# Patient Record
Sex: Female | Born: 1940 | Race: White | Hispanic: No | Smoking: Current every day smoker
Health system: Southern US, Community
[De-identification: ages and names within clinical notes are randomized; demographics above are authoritative.]

## PROBLEM LIST (undated history)

## (undated) DIAGNOSIS — D509 Iron deficiency anemia, unspecified: Secondary | ICD-10-CM

## (undated) DIAGNOSIS — T4145XA Adverse effect of unspecified anesthetic, initial encounter: Secondary | ICD-10-CM

## (undated) DIAGNOSIS — C801 Malignant (primary) neoplasm, unspecified: Secondary | ICD-10-CM

## (undated) DIAGNOSIS — T8859XA Other complications of anesthesia, initial encounter: Secondary | ICD-10-CM

## (undated) DIAGNOSIS — R112 Nausea with vomiting, unspecified: Secondary | ICD-10-CM

## (undated) DIAGNOSIS — K219 Gastro-esophageal reflux disease without esophagitis: Secondary | ICD-10-CM

## (undated) DIAGNOSIS — Z9889 Other specified postprocedural states: Secondary | ICD-10-CM

## (undated) HISTORY — PX: WRIST FOREIGN BODY REMOVAL: SUR1120

## (undated) HISTORY — PX: APPENDECTOMY: SHX54

## (undated) HISTORY — PX: ABDOMINAL HYSTERECTOMY: SHX81

## (undated) HISTORY — PX: COLONOSCOPY: SHX174

## (undated) HISTORY — PX: PERCUTANEOUS PINNING WRIST FRACTURE: SHX2211

## (undated) HISTORY — DX: Gastro-esophageal reflux disease without esophagitis: K21.9

---
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
## 2012-03-27 DIAGNOSIS — B009 Herpesviral infection, unspecified: Secondary | ICD-10-CM | POA: Insufficient documentation

## 2015-04-23 ENCOUNTER — Emergency Department: Payer: No Typology Code available for payment source

## 2015-04-23 ENCOUNTER — Encounter: Payer: Self-pay | Admitting: Emergency Medicine

## 2015-04-23 ENCOUNTER — Emergency Department
Admission: EM | Admit: 2015-04-23 | Discharge: 2015-04-23 | Disposition: A | Discharge status: Home / Self Care | Payer: No Typology Code available for payment source | Attending: Emergency Medicine | Admitting: Emergency Medicine

## 2015-04-23 DIAGNOSIS — Z880 Allergy status to penicillin: Secondary | ICD-10-CM | POA: Insufficient documentation

## 2015-04-23 DIAGNOSIS — S92325A Nondisplaced fracture of second metatarsal bone, left foot, initial encounter for closed fracture: Secondary | ICD-10-CM | POA: Diagnosis not present

## 2015-04-23 DIAGNOSIS — Y9389 Activity, other specified: Secondary | ICD-10-CM | POA: Diagnosis not present

## 2015-04-23 DIAGNOSIS — S61411A Laceration without foreign body of right hand, initial encounter: Secondary | ICD-10-CM | POA: Diagnosis not present

## 2015-04-23 DIAGNOSIS — S301XXA Contusion of abdominal wall, initial encounter: Secondary | ICD-10-CM | POA: Insufficient documentation

## 2015-04-23 DIAGNOSIS — Y92410 Unspecified street and highway as the place of occurrence of the external cause: Secondary | ICD-10-CM | POA: Insufficient documentation

## 2015-04-23 DIAGNOSIS — S92332A Displaced fracture of third metatarsal bone, left foot, initial encounter for closed fracture: Secondary | ICD-10-CM | POA: Insufficient documentation

## 2015-04-23 DIAGNOSIS — S92902A Unspecified fracture of left foot, initial encounter for closed fracture: Secondary | ICD-10-CM

## 2015-04-23 DIAGNOSIS — S62101A Fracture of unspecified carpal bone, right wrist, initial encounter for closed fracture: Secondary | ICD-10-CM

## 2015-04-23 DIAGNOSIS — F17200 Nicotine dependence, unspecified, uncomplicated: Secondary | ICD-10-CM | POA: Insufficient documentation

## 2015-04-23 DIAGNOSIS — S9032XA Contusion of left foot, initial encounter: Secondary | ICD-10-CM | POA: Insufficient documentation

## 2015-04-23 DIAGNOSIS — S6991XA Unspecified injury of right wrist, hand and finger(s), initial encounter: Secondary | ICD-10-CM | POA: Diagnosis present

## 2015-04-23 DIAGNOSIS — S52614A Nondisplaced fracture of right ulna styloid process, initial encounter for closed fracture: Secondary | ICD-10-CM | POA: Diagnosis not present

## 2015-04-23 DIAGNOSIS — Y998 Other external cause status: Secondary | ICD-10-CM | POA: Diagnosis not present

## 2015-04-23 DIAGNOSIS — S29001A Unspecified injury of muscle and tendon of front wall of thorax, initial encounter: Secondary | ICD-10-CM | POA: Diagnosis not present

## 2015-04-23 DIAGNOSIS — T07XXXA Unspecified multiple injuries, initial encounter: Secondary | ICD-10-CM

## 2015-04-23 LAB — COMPREHENSIVE METABOLIC PANEL
ALK PHOS: 83 U/L (ref 38–126)
ALT: 34 U/L (ref 14–54)
AST: 50 U/L — ABNORMAL HIGH (ref 15–41)
Albumin: 3.9 g/dL (ref 3.5–5.0)
Anion gap: 11 (ref 5–15)
BUN: 11 mg/dL (ref 6–20)
CALCIUM: 9.5 mg/dL (ref 8.9–10.3)
CO2: 23 mmol/L (ref 22–32)
CREATININE: 0.67 mg/dL (ref 0.44–1.00)
Chloride: 106 mmol/L (ref 101–111)
Glucose, Bld: 119 mg/dL — ABNORMAL HIGH (ref 65–99)
Potassium: 3.9 mmol/L (ref 3.5–5.1)
SODIUM: 140 mmol/L (ref 135–145)
Total Bilirubin: 0.5 mg/dL (ref 0.3–1.2)
Total Protein: 7.7 g/dL (ref 6.5–8.1)

## 2015-04-23 LAB — URINALYSIS COMPLETE WITH MICROSCOPIC (ARMC ONLY)
BACTERIA UA: NONE SEEN
Bilirubin Urine: NEGATIVE
GLUCOSE, UA: NEGATIVE mg/dL
Ketones, ur: NEGATIVE mg/dL
LEUKOCYTES UA: NEGATIVE
NITRITE: NEGATIVE
PH: 7 (ref 5.0–8.0)
PROTEIN: NEGATIVE mg/dL
SPECIFIC GRAVITY, URINE: 1.005 (ref 1.005–1.030)

## 2015-04-23 LAB — CBC WITH DIFFERENTIAL/PLATELET
BASOS ABS: 0.1 10*3/uL (ref 0–0.1)
Basophils Relative: 1 %
EOS ABS: 0.1 10*3/uL (ref 0–0.7)
Eosinophils Relative: 1 %
HEMATOCRIT: 38.7 % (ref 35.0–47.0)
HEMOGLOBIN: 12.7 g/dL (ref 12.0–16.0)
Lymphocytes Relative: 14 %
Lymphs Abs: 1.7 10*3/uL (ref 1.0–3.6)
MCH: 30.2 pg (ref 26.0–34.0)
MCHC: 32.9 g/dL (ref 32.0–36.0)
MCV: 91.9 fL (ref 80.0–100.0)
MONOS PCT: 7 %
Monocytes Absolute: 0.8 10*3/uL (ref 0.2–0.9)
NEUTROS PCT: 77 %
Neutro Abs: 9.2 10*3/uL — ABNORMAL HIGH (ref 1.4–6.5)
PLATELETS: 318 10*3/uL (ref 150–440)
RBC: 4.21 MIL/uL (ref 3.80–5.20)
RDW: 12.9 % (ref 11.5–14.5)
WBC: 11.9 10*3/uL — ABNORMAL HIGH (ref 3.6–11.0)

## 2015-04-23 LAB — TROPONIN I: Troponin I: <0.03 ng/mL (ref <0.031)

## 2015-04-23 MED ORDER — ADENOSINE 12 MG/4ML IV SOLN
INTRAVENOUS | Status: AC
  Filled 2015-04-23: qty 4

## 2015-04-23 MED ORDER — ACETAMINOPHEN 10 MG/ML IV SOLN
1000.0000 mg | Freq: Four times a day (QID) | INTRAVENOUS | Status: DC
  Administered 2015-04-23: 1000 mg via INTRAVENOUS
  Filled 2015-04-23 (×4): qty 100

## 2015-04-23 MED ORDER — SODIUM CHLORIDE 0.9 % IV BOLUS (SEPSIS)
1000.0000 mL | Freq: Once | INTRAVENOUS | Status: AC
  Administered 2015-04-23: 1000 mL via INTRAVENOUS

## 2015-04-23 MED ORDER — ADENOSINE 12 MG/4ML IV SOLN
INTRAVENOUS | Status: AC
  Filled 2015-04-23: qty 8

## 2015-04-23 MED ORDER — IOHEXOL 350 MG/ML SOLN
100.0000 mL | Freq: Once | INTRAVENOUS | Status: AC | PRN
  Administered 2015-04-23: 100 mL via INTRAVENOUS
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
## 2015-04-23 NOTE — ED Notes (Signed)
Pt discharged to home.  Family member driving.  Discharge instructions reviewed.  Verbalized understanding.  No questions or concerns at this time.  Teach back verified.  Pt in NAD.  No items left in ED.   
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
## 2015-04-23 NOTE — Discharge Instructions (Signed)
Cast or Splint Care Casts and splints support injured limbs and keep bones from moving while they heal. It is important to care for your cast or splint at home.  HOME CARE INSTRUCTIONS  Keep the cast or splint uncovered during the drying period. It can take 24 to 48 hours to dry if it is made of plaster. A fiberglass cast will dry in less than 1 hour.  Do not rest the cast on anything harder than a pillow for the first 24 hours.  Do not put weight on your injured limb or apply pressure to the cast until your health care provider gives you permission.  Keep the cast or splint dry. Wet casts or splints can lose their shape and may not support the limb as well. A wet cast that has lost its shape can also create harmful pressure on your skin when it dries. Also, wet skin can become infected.  Cover the cast or splint with a plastic bag when bathing or when out in the rain or snow. If the cast is on the trunk of the body, take sponge baths until the cast is removed.  If your cast does become wet, dry it with a towel or a blow dryer on the cool setting only.  Keep your cast or splint clean. Soiled casts may be wiped with a moistened cloth.  Do not place any hard or soft foreign objects under your cast or splint, such as cotton, toilet paper, lotion, or powder.  Do not try to scratch the skin under the cast with any object. The object could get stuck inside the cast. Also, scratching could lead to an infection. If itching is a problem, use a blow dryer on a cool setting to relieve discomfort.  Do not trim or cut your cast or remove padding from inside of it.  Exercise all joints next to the injury that are not immobilized by the cast or splint. For example, if you have a long leg cast, exercise the hip joint and toes. If you have an arm cast or splint, exercise the shoulder, elbow, thumb, and fingers.  Elevate your injured arm or leg on 1 or 2 pillows for the first 1 to 3 days to decrease  swelling and pain.It is best if you can comfortably elevate your cast so it is higher than your heart. SEEK MEDICAL CARE IF:   Your cast or splint cracks.  Your cast or splint is too tight or too loose.  You have unbearable itching inside the cast.  Your cast becomes wet or develops a soft spot or area.  You have a bad smell coming from inside your cast.  You get an object stuck under your cast.  Your skin around the cast becomes red or raw.  You have new pain or worsening pain after the cast has been applied. SEEK IMMEDIATE MEDICAL CARE IF:   You have fluid leaking through the cast.  You are unable to move your fingers or toes.  You have discolored (blue or white), cool, painful, or very swollen fingers or toes beyond the cast.  You have tingling or numbness around the injured area.  You have severe pain or pressure under the cast.  You have any difficulty with your breathing or have shortness of breath.  You have chest pain.   This information is not intended to replace advice given to you by your health care provider. Make sure you discuss any questions you have with your health care  provider.   Document Released: 03/23/2000 Document Revised: 01/14/2013 Document Reviewed: 10/02/2012 Elsevier Interactive Patient Education 2016 [employer].  Wear the splints. Keep the wrist and foot elevated as much as she can. He can use ice 20 minutes every hour or so. Do not fall sleep with ice on limits. Frostbite. If you experience worse pain or blueness or paleness of the fingers or toes please go to the nearest hospital and have herself be checked out you can get more swelling from the fractures. Sometimes this can get bad enough to cut off the blood flow to anything past the fracture. This can result in permanent injury and needs to be checked out quickly.  Please take the CDs that we give you with the x-ray films on them. Follow-up with the orthopedic doctor of your choice in  [place]. Since you cannot take any pain medicines I would just advise you to use Tylenol for pain. A careful not to take more than is instructed on the bottle.

## 2015-04-23 NOTE — ED Notes (Signed)
Bp right arm: 138/65  BP left arm: 148/88  BP right leg: 175/99  BP left leg: 161/90

## 2015-05-10 DIAGNOSIS — R911 Solitary pulmonary nodule: Secondary | ICD-10-CM | POA: Insufficient documentation

## 2016-05-10 ENCOUNTER — Emergency Department
Admission: EM | Admit: 2016-05-10 | Discharge: 2016-05-10 | Discharge status: Absent without leave | Payer: Medicare Other

## 2017-01-01 DIAGNOSIS — Z85828 Personal history of other malignant neoplasm of skin: Secondary | ICD-10-CM | POA: Insufficient documentation

## 2017-04-23 DIAGNOSIS — R252 Cramp and spasm: Secondary | ICD-10-CM | POA: Diagnosis not present

## 2017-04-23 DIAGNOSIS — Z0000 Encounter for general adult medical examination without abnormal findings: Secondary | ICD-10-CM | POA: Insufficient documentation

## 2017-07-30 DIAGNOSIS — R252 Cramp and spasm: Secondary | ICD-10-CM | POA: Diagnosis not present

## 2017-07-30 DIAGNOSIS — J019 Acute sinusitis, unspecified: Secondary | ICD-10-CM | POA: Diagnosis not present

## 2017-07-30 DIAGNOSIS — M160 Bilateral primary osteoarthritis of hip: Secondary | ICD-10-CM | POA: Diagnosis not present

## 2017-07-30 DIAGNOSIS — M25552 Pain in left hip: Secondary | ICD-10-CM | POA: Diagnosis not present

## 2017-10-16 DIAGNOSIS — M9903 Segmental and somatic dysfunction of lumbar region: Secondary | ICD-10-CM | POA: Diagnosis not present

## 2017-10-16 DIAGNOSIS — M5441 Lumbago with sciatica, right side: Secondary | ICD-10-CM | POA: Diagnosis not present

## 2017-10-17 DIAGNOSIS — H524 Presbyopia: Secondary | ICD-10-CM | POA: Diagnosis not present

## 2017-10-22 DIAGNOSIS — M9903 Segmental and somatic dysfunction of lumbar region: Secondary | ICD-10-CM | POA: Diagnosis not present

## 2017-10-22 DIAGNOSIS — M5441 Lumbago with sciatica, right side: Secondary | ICD-10-CM | POA: Diagnosis not present

## 2017-10-29 DIAGNOSIS — M9903 Segmental and somatic dysfunction of lumbar region: Secondary | ICD-10-CM | POA: Diagnosis not present

## 2017-10-29 DIAGNOSIS — S335XXA Sprain of ligaments of lumbar spine, initial encounter: Secondary | ICD-10-CM | POA: Diagnosis not present

## 2018-01-02 DIAGNOSIS — L821 Other seborrheic keratosis: Secondary | ICD-10-CM | POA: Diagnosis not present

## 2018-01-02 DIAGNOSIS — D492 Neoplasm of unspecified behavior of bone, soft tissue, and skin: Secondary | ICD-10-CM | POA: Diagnosis not present

## 2018-01-02 DIAGNOSIS — L570 Actinic keratosis: Secondary | ICD-10-CM | POA: Diagnosis not present

## 2018-01-02 DIAGNOSIS — L309 Dermatitis, unspecified: Secondary | ICD-10-CM | POA: Diagnosis not present

## 2018-01-02 DIAGNOSIS — L814 Other melanin hyperpigmentation: Secondary | ICD-10-CM | POA: Diagnosis not present

## 2018-01-02 DIAGNOSIS — Z85828 Personal history of other malignant neoplasm of skin: Secondary | ICD-10-CM | POA: Diagnosis not present

## 2018-04-24 DIAGNOSIS — Z0000 Encounter for general adult medical examination without abnormal findings: Secondary | ICD-10-CM | POA: Diagnosis not present

## 2018-04-24 DIAGNOSIS — R252 Cramp and spasm: Secondary | ICD-10-CM | POA: Diagnosis not present

## 2018-06-17 DIAGNOSIS — M5442 Lumbago with sciatica, left side: Secondary | ICD-10-CM | POA: Diagnosis not present

## 2018-06-17 DIAGNOSIS — M9903 Segmental and somatic dysfunction of lumbar region: Secondary | ICD-10-CM | POA: Diagnosis not present

## 2018-06-17 DIAGNOSIS — M25552 Pain in left hip: Secondary | ICD-10-CM | POA: Diagnosis not present

## 2018-06-30 DIAGNOSIS — M5442 Lumbago with sciatica, left side: Secondary | ICD-10-CM | POA: Diagnosis not present

## 2018-06-30 DIAGNOSIS — M25552 Pain in left hip: Secondary | ICD-10-CM | POA: Diagnosis not present

## 2018-06-30 DIAGNOSIS — M9903 Segmental and somatic dysfunction of lumbar region: Secondary | ICD-10-CM | POA: Diagnosis not present

## 2018-08-19 ENCOUNTER — Other Ambulatory Visit: Payer: Self-pay | Admitting: Internal Medicine

## 2018-08-19 DIAGNOSIS — R1010 Upper abdominal pain, unspecified: Secondary | ICD-10-CM

## 2018-08-20 ENCOUNTER — Other Ambulatory Visit: Payer: Self-pay

## 2018-08-20 ENCOUNTER — Ambulatory Visit
Admission: RE | Admit: 2018-08-20 | Discharge: 2018-08-20 | Disposition: A | Discharge status: Home / Self Care | Payer: Medicare HMO | Source: Ambulatory Visit | Attending: Internal Medicine | Admitting: Internal Medicine

## 2018-08-20 DIAGNOSIS — R1010 Upper abdominal pain, unspecified: Secondary | ICD-10-CM

## 2018-08-20 DIAGNOSIS — K8020 Calculus of gallbladder without cholecystitis without obstruction: Secondary | ICD-10-CM | POA: Diagnosis not present

## 2018-08-21 ENCOUNTER — Other Ambulatory Visit: Payer: Self-pay | Admitting: Internal Medicine

## 2018-08-21 DIAGNOSIS — R1010 Upper abdominal pain, unspecified: Secondary | ICD-10-CM

## 2018-08-22 ENCOUNTER — Ambulatory Visit: Payer: Medicare HMO | Admitting: Anesthesiology

## 2018-08-22 ENCOUNTER — Encounter: Payer: Self-pay | Admitting: *Deleted

## 2018-08-22 ENCOUNTER — Encounter
Admission: AD | Disposition: A | Discharge status: Home / Self Care | Payer: Self-pay | Source: Home / Self Care | Attending: General Surgery

## 2018-08-22 ENCOUNTER — Ambulatory Visit: Payer: Self-pay | Admitting: General Surgery

## 2018-08-22 ENCOUNTER — Ambulatory Visit
Admission: AD | Admit: 2018-08-22 | Discharge: 2018-08-22 | Disposition: A | Discharge status: Home / Self Care | Payer: Medicare HMO | Attending: General Surgery | Admitting: General Surgery

## 2018-08-22 ENCOUNTER — Other Ambulatory Visit
Admission: RE | Admit: 2018-08-22 | Discharge: 2018-08-22 | Disposition: A | Discharge status: Home / Self Care | Payer: Medicare HMO | Source: Home / Self Care | Attending: General Surgery | Admitting: General Surgery

## 2018-08-22 ENCOUNTER — Other Ambulatory Visit: Payer: Self-pay

## 2018-08-22 DIAGNOSIS — R69 Illness, unspecified: Secondary | ICD-10-CM | POA: Diagnosis not present

## 2018-08-22 DIAGNOSIS — Z880 Allergy status to penicillin: Secondary | ICD-10-CM | POA: Diagnosis not present

## 2018-08-22 DIAGNOSIS — Z1159 Encounter for screening for other viral diseases: Secondary | ICD-10-CM | POA: Insufficient documentation

## 2018-08-22 DIAGNOSIS — K8000 Calculus of gallbladder with acute cholecystitis without obstruction: Secondary | ICD-10-CM | POA: Diagnosis not present

## 2018-08-22 DIAGNOSIS — Z885 Allergy status to narcotic agent status: Secondary | ICD-10-CM | POA: Diagnosis not present

## 2018-08-22 DIAGNOSIS — F17200 Nicotine dependence, unspecified, uncomplicated: Secondary | ICD-10-CM | POA: Diagnosis not present

## 2018-08-22 DIAGNOSIS — R1013 Epigastric pain: Secondary | ICD-10-CM | POA: Diagnosis not present

## 2018-08-22 DIAGNOSIS — R110 Nausea: Secondary | ICD-10-CM | POA: Diagnosis not present

## 2018-08-22 DIAGNOSIS — R1011 Right upper quadrant pain: Secondary | ICD-10-CM | POA: Diagnosis not present

## 2018-08-22 DIAGNOSIS — D649 Anemia, unspecified: Secondary | ICD-10-CM | POA: Diagnosis not present

## 2018-08-22 DIAGNOSIS — K8010 Calculus of gallbladder with chronic cholecystitis without obstruction: Secondary | ICD-10-CM | POA: Diagnosis not present

## 2018-08-22 DIAGNOSIS — K810 Acute cholecystitis: Secondary | ICD-10-CM | POA: Diagnosis not present

## 2018-08-22 HISTORY — PX: CHOLECYSTECTOMY: SHX55

## 2018-08-22 LAB — SARS CORONAVIRUS 2 BY RT PCR (HOSPITAL ORDER, PERFORMED IN ~~LOC~~ HOSPITAL LAB): SARS Coronavirus 2: NEGATIVE

## 2018-08-22 SURGERY — LAPAROSCOPIC CHOLECYSTECTOMY
Anesthesia: General

## 2018-08-22 MED ORDER — METRONIDAZOLE IN NACL 5-0.79 MG/ML-% IV SOLN
500.0000 mg | INTRAVENOUS | Status: AC
  Administered 2018-08-22: 500 mg via INTRAVENOUS
  Filled 2018-08-22 (×2): qty 100

## 2018-08-22 MED ORDER — DEXAMETHASONE SODIUM PHOSPHATE 10 MG/ML IJ SOLN
INTRAMUSCULAR | Status: DC | PRN
  Administered 2018-08-22: 10 mg via INTRAVENOUS

## 2018-08-22 MED ORDER — GABAPENTIN 300 MG PO CAPS: 300.0000 mg | ORAL_CAPSULE | Freq: Three times a day (TID) | ORAL | 0 refills | Status: DC

## 2018-08-22 MED ORDER — ACETAMINOPHEN 10 MG/ML IV SOLN
INTRAVENOUS | Status: AC
  Filled 2018-08-22: qty 100

## 2018-08-22 MED ORDER — SUCCINYLCHOLINE CHLORIDE 20 MG/ML IJ SOLN
INTRAMUSCULAR | Status: DC | PRN
  Administered 2018-08-22: 100 mg via INTRAVENOUS

## 2018-08-22 MED ORDER — MIDAZOLAM HCL 2 MG/2ML IJ SOLN
INTRAMUSCULAR | Status: AC
  Filled 2018-08-22: qty 2

## 2018-08-22 MED ORDER — MIDAZOLAM HCL 2 MG/2ML IJ SOLN
INTRAMUSCULAR | Status: DC | PRN
  Administered 2018-08-22 (×2): 1 mg via INTRAVENOUS

## 2018-08-22 MED ORDER — PROPOFOL 10 MG/ML IV BOLUS
INTRAVENOUS | Status: DC | PRN
  Administered 2018-08-22: 100 mg via INTRAVENOUS

## 2018-08-22 MED ORDER — BUPIVACAINE-EPINEPHRINE (PF) 0.5% -1:200000 IJ SOLN
INTRAMUSCULAR | Status: DC | PRN
  Administered 2018-08-22: 30 mL

## 2018-08-22 MED ORDER — FENTANYL CITRATE (PF) 100 MCG/2ML IJ SOLN
INTRAMUSCULAR | Status: AC
  Filled 2018-08-22: qty 4

## 2018-08-22 MED ORDER — KETOROLAC TROMETHAMINE 30 MG/ML IJ SOLN
INTRAMUSCULAR | Status: DC | PRN
  Administered 2018-08-22: 30 mg via INTRAVENOUS

## 2018-08-22 MED ORDER — ROCURONIUM BROMIDE 100 MG/10ML IV SOLN
INTRAVENOUS | Status: DC | PRN
  Administered 2018-08-22: 40 mg via INTRAVENOUS
  Administered 2018-08-22: 10 mg via INTRAVENOUS

## 2018-08-22 MED ORDER — CIPROFLOXACIN IN D5W 400 MG/200ML IV SOLN
INTRAVENOUS | Status: AC
  Filled 2018-08-22: qty 200

## 2018-08-22 MED ORDER — ONDANSETRON HCL 4 MG/2ML IJ SOLN
INTRAMUSCULAR | Status: DC | PRN
  Administered 2018-08-22: 4 mg via INTRAVENOUS

## 2018-08-22 MED ORDER — ONDANSETRON HCL 4 MG/2ML IJ SOLN: 4.0000 mg | Freq: Once | INTRAMUSCULAR | Status: DC | PRN

## 2018-08-22 MED ORDER — FENTANYL CITRATE (PF) 100 MCG/2ML IJ SOLN: 25.0000 ug | INTRAMUSCULAR | Status: DC | PRN

## 2018-08-22 MED ORDER — PHENYLEPHRINE HCL (PRESSORS) 10 MG/ML IV SOLN
INTRAVENOUS | Status: DC | PRN
  Administered 2018-08-22 (×5): 100 ug via INTRAVENOUS

## 2018-08-22 MED ORDER — BUPIVACAINE-EPINEPHRINE (PF) 0.5% -1:200000 IJ SOLN
INTRAMUSCULAR | Status: AC
  Filled 2018-08-22: qty 30

## 2018-08-22 MED ORDER — LIDOCAINE HCL (CARDIAC) PF 100 MG/5ML IV SOSY
PREFILLED_SYRINGE | INTRAVENOUS | Status: DC | PRN
  Administered 2018-08-22: 100 mg via INTRAVENOUS

## 2018-08-22 MED ORDER — CIPROFLOXACIN IN D5W 400 MG/200ML IV SOLN
400.0000 mg | INTRAVENOUS | Status: AC
  Administered 2018-08-22: 400 mg via INTRAVENOUS

## 2018-08-22 MED ORDER — SUGAMMADEX SODIUM 200 MG/2ML IV SOLN
INTRAVENOUS | Status: DC | PRN
  Administered 2018-08-22: 130 mg via INTRAVENOUS

## 2018-08-22 MED ORDER — FENTANYL CITRATE (PF) 100 MCG/2ML IJ SOLN
INTRAMUSCULAR | Status: DC | PRN
  Administered 2018-08-22 (×3): 50 ug via INTRAVENOUS

## 2018-08-22 MED ORDER — LACTATED RINGERS IV SOLN
INTRAVENOUS | Status: DC | PRN
  Administered 2018-08-22: 14:00:00 via INTRAVENOUS

## 2018-08-22 MED ORDER — KETOROLAC TROMETHAMINE 30 MG/ML IJ SOLN
INTRAMUSCULAR | Status: AC
  Filled 2018-08-22: qty 1

## 2018-08-22 SURGICAL SUPPLY — 41 items
APPLIER CLIP 5 13 M/L LIGAMAX5 (MISCELLANEOUS) ×3
BLADE SURG SZ11 CARB STEEL (BLADE) ×3 IMPLANT
CANISTER SUCT 1200ML W/VALVE (MISCELLANEOUS) ×3 IMPLANT
CATH CHOLANG 76X19 KUMAR (CATHETERS) ×1 IMPLANT
CHLORAPREP W/TINT 26 (MISCELLANEOUS) ×3 IMPLANT
CLIP APPLIE 5 13 M/L LIGAMAX5 (MISCELLANEOUS) ×1 IMPLANT
COVER WAND RF STERILE (DRAPES) ×3 IMPLANT
DERMABOND ADVANCED (GAUZE/BANDAGES/DRESSINGS) ×2
DERMABOND ADVANCED .7 DNX12 (GAUZE/BANDAGES/DRESSINGS) ×1 IMPLANT
ELECT REM PT RETURN 9FT ADLT (ELECTROSURGICAL) ×3
ELECTRODE REM PT RTRN 9FT ADLT (ELECTROSURGICAL) ×1 IMPLANT
GLOVE BIO SURGEON STRL SZ 6.5 (GLOVE) ×5 IMPLANT
GLOVE BIO SURGEONS STRL SZ 6.5 (GLOVE) ×4
GLOVE BIOGEL PI IND STRL 7.0 (GLOVE) IMPLANT
GLOVE BIOGEL PI INDICATOR 7.0 (GLOVE) ×6
GLOVE INDICATOR 6.5 STRL GRN (GLOVE) ×3 IMPLANT
GOWN STRL REUS W/ TWL LRG LVL3 (GOWN DISPOSABLE) ×4 IMPLANT
GOWN STRL REUS W/TWL LRG LVL3 (GOWN DISPOSABLE) ×8
GRASPER SUT TROCAR 14GX15 (MISCELLANEOUS) ×2 IMPLANT
HEMOSTAT SURGICEL 2X3 (HEMOSTASIS) IMPLANT
IRRIGATION STRYKERFLOW (MISCELLANEOUS) ×1 IMPLANT
IRRIGATOR STRYKERFLOW (MISCELLANEOUS) ×3
IV NS 1000ML (IV SOLUTION) ×2
IV NS 1000ML BAXH (IV SOLUTION) ×1 IMPLANT
KIT TURNOVER KIT A (KITS) ×3 IMPLANT
LABEL OR SOLS (LABEL) ×3 IMPLANT
NDL HYPO 25X1 1.5 SAFETY (NEEDLE) ×1 IMPLANT
NDL INSUFFLATION 14GA 120MM (NEEDLE) ×1 IMPLANT
NEEDLE HYPO 25X1 1.5 SAFETY (NEEDLE) ×3 IMPLANT
NEEDLE INSUFFLATION 14GA 120MM (NEEDLE) ×3 IMPLANT
NS IRRIG 500ML POUR BTL (IV SOLUTION) ×3 IMPLANT
PACK LAP CHOLECYSTECTOMY (MISCELLANEOUS) ×3 IMPLANT
POUCH SPECIMEN RETRIEVAL 10MM (ENDOMECHANICALS) ×3 IMPLANT
SCISSORS METZENBAUM CVD 33 (INSTRUMENTS) ×3 IMPLANT
SET TUBE SMOKE EVAC HIGH FLOW (TUBING) ×3 IMPLANT
SLEEVE ENDOPATH XCEL 5M (ENDOMECHANICALS) ×6 IMPLANT
SUT MNCRL AB 4-0 PS2 18 (SUTURE) ×3 IMPLANT
SUT VIC AB 0 CT1 36 (SUTURE) IMPLANT
SUT VICRYL 0 AB UR-6 (SUTURE) ×3 IMPLANT
TROCAR XCEL NON-BLD 11X100MML (ENDOMECHANICALS) ×3 IMPLANT
TROCAR XCEL NON-BLD 5MMX100MML (ENDOMECHANICALS) ×3 IMPLANT

## 2018-08-22 NOTE — Discharge Instructions (Addendum)
Diet: Resume home heart healthy regular diet.   Activity: No heavy lifting >20 pounds (children, pets, laundry, garbage) or strenuous activity until follow-up, but light activity and walking are encouraged. Do not drive or drink alcohol if taking narcotic pain medications.  Wound care: May shower with soapy water and pat dry (do not rub incisions), but no baths or submerging incision underwater until follow-up. (no swimming)   Medications: Resume all home medications. For mild to moderate pain: acetaminophen (Tylenol) or ibuprofen (if no kidney disease). Combining Tylenol with alcohol can substantially increase your risk of causing liver disease. May take Gabapentin in combination with ibuprofen for pain control.   Call office [phone]) at any time if any questions, worsening pain, fevers/chills, bleeding, drainage from incision site, or other concerns.   Laparoscopic Cholecystectomy, Care After This sheet gives you information about how to care for yourself after your procedure. Your doctor may also give you more specific instructions. If you have problems or questions, contact your doctor. Follow these instructions at home: Care for cuts from surgery (incisions)   Follow instructions from your doctor about how to take care of your cuts from surgery. Make sure you: ? Wash your hands with soap and water before you change your bandage (dressing). If you cannot use soap and water, use hand sanitizer. ? Change your bandage as told by your doctor. ? Leave stitches (sutures), skin glue, or skin tape (adhesive) strips in place. They may need to stay in place for 2 weeks or longer. If tape strips get loose and curl up, you may trim the loose edges. Do not remove tape strips completely unless your doctor says it is okay.  Do not take baths, swim, or use a hot tub until your doctor says it is okay. Ask your doctor if you can take showers. You may only be allowed to take sponge baths for  bathing.  Check your surgical cut area every day for signs of infection. Check for: ? More redness, swelling, or pain. ? More fluid or blood. ? Warmth. ? Pus or a bad smell. Activity  Do not drive or use heavy machinery while taking prescription pain medicine.  Do not lift anything that is heavier than 10 lb (4.5 kg) until your doctor says it is okay.  Do not play contact sports until your doctor says it is okay.  Do not drive for 24 hours if you were given a medicine to help you relax (sedative).  Rest as needed. Do not return to work or school until your doctor says it is okay. General instructions  Take over-the-counter and prescription medicines only as told by your doctor.  To prevent or treat constipation while you are taking prescription pain medicine, your doctor may recommend that you: ? Drink enough fluid to keep your pee (urine) clear or pale yellow. ? Take over-the-counter or prescription medicines. ? Eat foods that are high in fiber, such as fresh fruits and vegetables, whole grains, and beans. ? Limit foods that are high in fat and processed sugars, such as fried and sweet foods. Contact a doctor if:  You develop a rash.  You have more redness, swelling, or pain around your surgical cuts.  You have more fluid or blood coming from your surgical cuts.  Your surgical cuts feel warm to the touch.  You have pus or a bad smell coming from your surgical cuts.  You have a fever.  One or more of your surgical cuts breaks open. Get help  right away if:  You have trouble breathing.  You have chest pain.  You have pain that is getting worse in your shoulders.  You faint or feel dizzy when you stand.  You have very bad pain in your belly (abdomen).  You are sick to your stomach (nauseous) for more than one day.  You have throwing up (vomiting) that lasts for more than one day.  You have leg pain. This information is not intended to replace advice given to  you by your health care provider. Make sure you discuss any questions you have with your health care provider. Document Released: 01/03/2008 Document Revised: 10/15/2015 Document Reviewed: 09/12/2015 Elsevier Interactive Patient Education  2019 [employer]   1) The drugs that you were given will stay in your system until tomorrow so for the next 24 hours you should not:  A) Drive an automobile B) Make any legal decisions C) Drink any alcoholic beverage   2) You may resume regular meals tomorrow.  Today it is better to start with liquids and gradually work up to solid foods.  You may eat anything you prefer, but it is better to start with liquids, then soup and crackers, and gradually work up to solid foods.   3) Please notify your doctor immediately if you have any unusual bleeding, trouble breathing, redness and pain at the surgery site, drainage, fever, or pain not relieved by medication.    4) Additional Instructions:        Please contact your physician with any problems or Same Day Surgery at [phone], Monday through Friday 6 am to 4 pm, or [place] at [facility] number at [phone].

## 2018-08-22 NOTE — H&P (View-Only) (Signed)
SURGICAL CONSULTATION NOTE   HISTORY OF PRESENT ILLNESS (HPI):  78 y.o. female presented to GI office for evaluation of abdominal pain. Patient reports having abdominal pain since 2 to 4 weeks ago.  Has been increasing in the last 2 days.  He went to see her primary care physician who ordered a abdominal ultrasound.  Abdominal ultrasound shows mild gallbladder wall thickening and pericholecystic edema.  I personally reviewed the images.  Since then she has persistently getting worse.  Aggravating factor is oral intake.  Patient reports that every time that she eats the pain gets worse.  When she is n.p.o. with the pain is mild but constant.  She denies any fever or chills.  Pains is on the right upper quadrant and radiates to her right back.  She also had pain on the epigastric area.  She denies diarrhea or constipation.  Labs done 3 days ago and today shows no leukocytosis with chronic anemia.  There is normal liver function test and normal bilirubin.  I personally reviewed the labs.  Surgery is consulted by [facility], [facility] in this context for evaluation and management of acute cholecystitis.  PAST MEDICAL HISTORY ([facility]):  Chronic musculoskeletal pain GERD  PAST SURGICAL HISTORY ([place]):  Upper endoscopy  MEDICATIONS:  Prior to Admission medications   Not on File     ALLERGIES:  Allergies  Allergen Reactions  . Codeine   . Penicillins      SOCIAL HISTORY:  Social History   Socioeconomic History  . Marital status: Divorced    Spouse name: Not on file  . Number of children: Not on file  . Years of education: Not on file  . Highest education level: Not on file  Occupational History  . Not on file  Social Needs  . Financial resource strain: Not on file  . Food insecurity:    Worry: Not on file    Inability: Not on file  . Transportation needs:    Medical: Not on file    Non-medical: Not on file  Tobacco Use  . Smoking status: Current Every Day Smoker  Substance and Sexual  Activity  . Alcohol use: No  . Drug use: Not on file  . Sexual activity: Not on file  Lifestyle  . Physical activity:    Days per week: Not on file    Minutes per session: Not on file  . Stress: Not on file  Relationships  . Social connections:    Talks on phone: Not on file    Gets together: Not on file    Attends religious service: Not on file    Active member of club or organization: Not on file    Attends meetings of clubs or organizations: Not on file    Relationship status: Not on file  . Intimate partner violence:    Fear of current or ex partner: Not on file    Emotionally abused: Not on file    Physically abused: Not on file    Forced sexual activity: Not on file  Other Topics Concern  . Not on file  Social History Narrative  . Not on file    The patient currently resides (home / rehab facility / nursing home): Home The patient normally is (ambulatory / bedbound): Ambulatory   FAMILY HISTORY:  No family history on file.   REVIEW OF SYSTEMS:  Constitutional: denies weight loss, fever, chills, or sweats  Eyes: denies any other vision changes, history of eye injury  ENT:  denies sore throat, hearing problems  Respiratory: denies shortness of breath, wheezing  Cardiovascular: denies chest pain, palpitations  Gastrointestinal: positive abdominal pain, Denies nausea and vomitnig Genitourinary: denies burning with urination or urinary frequency Musculoskeletal: denies any other joint pains or cramps  Skin: denies any other rashes or skin discolorations  Neurological: denies any other headache, dizziness, weakness  Psychiatric: denies any other depression, anxiety   All other review of systems were negative   VITAL SIGNS:  BP: 116/70 Heart rate 103 Respiration 16 Temp 36.4 Weight 62.1 kg  PHYSICAL EXAM:  Constitutional:  -- Normal body habitus  -- Awake, alert, and oriented x3  Eyes:  -- Pupils equally round and reactive to light  -- No scleral icterus   Ear, nose, and throat:  -- No jugular venous distension  Pulmonary:  -- No crackles  -- Equal breath sounds bilaterally -- Breathing non-labored at rest Cardiovascular:  -- S1, S2 present  -- No pericardial rubs Gastrointestinal:  -- Abdomen soft, tender on right upper quadrant, non-distended, no guarding or rebound tenderness -- No abdominal masses appreciated, pulsatile or otherwise  Musculoskeletal and Integumentary:  -- Wounds or skin discoloration: None appreciated -- Extremities: B/L UE and LE FROM, hands and feet warm, no edema  Neurologic:  -- Motor function: intact and symmetric -- Sensation: intact and symmetric   Labs:  CBC Latest Ref Rng & Units 04/23/2015  WBC 3.6 - 11.0 K/uL 11.9(H)  Hemoglobin 12.0 - 16.0 g/dL 12.7  Hematocrit 35.0 - 47.0 % 38.7  Platelets 150 - 440 K/uL 318   CMP Latest Ref Rng & Units 04/23/2015  Glucose 65 - 99 mg/dL 119(H)  BUN 6 - 20 mg/dL 11  Creatinine 0.44 - 1.00 mg/dL 0.67  Sodium 135 - 145 mmol/L 140  Potassium 3.5 - 5.1 mmol/L 3.9  Chloride 101 - 111 mmol/L 106  CO2 22 - 32 mmol/L 23  Calcium 8.9 - 10.3 mg/dL 9.5  Total Protein 6.5 - 8.1 g/dL 7.7  Total Bilirubin 0.3 - 1.2 mg/dL 0.5  Alkaline Phos 38 - 126 U/L 83  AST 15 - 41 U/L 50(H)  ALT 14 - 54 U/L 34   New labs done today shows hemoglobin 9.3, white blood cell count of 7.9 and platelet 386.  Imaging studies:  EXAM: ABDOMEN ULTRASOUND COMPLETE  COMPARISON:  CT abdomen and pelvis April 23, 2015  FINDINGS: Gallbladder: Within the gallbladder, there are echogenic foci which move and shadow consistent with cholelithiasis. Largest gallstone measures 8 mm in length. The gallbladder wall does not appear appreciably thickened. There is minimal pericholecystic fluid. No sonographic Murphy sign noted by sonographer.  Common bile duct: Diameter: 5 mm. No intrahepatic, common hepatic, or common bile dilatation.  Liver: No focal lesion identified. Within normal limits  in parenchymal echogenicity. Portal vein is patent on color Doppler imaging with normal direction of blood flow towards the liver.  IVC: No abnormality visualized.  Pancreas: No pancreatic mass or inflammatory focus.  Spleen: Size and appearance within normal limits.  Right Kidney: Length: 11.6 cm. Echogenicity within normal limits. No mass or hydronephrosis visualized.  Left Kidney: Length: 10.8 cm. Echogenicity within normal limits. No mass or hydronephrosis visualized.  Abdominal aorta: No aneurysm visualized. There is atherosclerotic calcification throughout the aorta.  Other findings: No demonstrable ascites.  IMPRESSION: 1. Cholelithiasis. Equivocal pericholecystic fluid. Gallbladder wall does not appear appreciably thickened. Given concern for early acute cholecystitis, it may be prudent to consider nuclear medicine hepatobiliary imaging study to assess for  cystic duct patency.  2.  Aortic Atherosclerosis (ICD10-I70.0).  3.  Study otherwise unremarkable.  These results will be called to the ordering clinician or representative by the Radiologist Assistant, and communication documented in the PACS or zVision Dashboard.   Electronically Signed   By: Lowella Grip III M.D.   On: 08/20/2018 10:39  Assessment/Plan:  78 y.o. female with acute cholecystitis.  Patient with history, physical exam and images consistent with acute cholecystitis.  She is pretty tender on the right upper quadrant.  Patient oriented about diagnosis and surgical management as treatment.   Discussed the risk of surgery including post-op infxn, seroma, biloma, chronic pain, poor-delayed wound healing, retained gallstone, conversion to open procedure, post-op SBO or ileus, and need for additional procedures to address said risks.  The risks of general anesthetic including MI, CVA, sudden death or even reaction to anesthetic medications also discussed. Alternatives include continued  observation.  Benefits include possible symptom relief, prevention of complications including acute cholecystitis, pancreatitis.  Arnold Long, MD

## 2018-08-22 NOTE — Op Note (Signed)
Preoperative diagnosis: Acute cholecystitis  Postoperative diagnosis: Acute cholecystitis.  Procedure: Laparoscopic Cholecystectomy.   Anesthesia: GETA   Surgeon: Dr. Windell Moment  Wound Classification: Clean Contaminated  Indications: Patient is a 78 y.o. female developed right upper quadrant pain and nausea and on workup was found to have cholelithiasis with a normal common duct and pericholecystic fluid consistent with cholecystitis. Laparoscopic cholecystectomy was elected.  Findings: Inflamed gallbladder with edema.  Critical view of safety achieved Cystic duct and artery identified, ligated and divided Adequate hemostasis  Description of procedure: The patient was placed on the operating table in the supine position. General anesthesia was induced. A time-out was completed verifying correct patient, procedure, site, positioning, and implant(s) and/or special equipment prior to beginning this procedure. An orogastric tube was placed. The abdomen was prepped and draped in the usual sterile fashion.  An incision was made in a natural skin line above the umbilicus.  The fascia was elevated and the Veress needle inserted. Proper position was confirmed by aspiration and saline meniscus test.  The abdomen was insufflated with carbon dioxide to a pressure of 15 mmHg. The patient tolerated insufflation well. A 11-mm trocar was then inserted.  The laparoscope was inserted and the abdomen inspected. No injuries from initial trocar placement were noted. Additional trocars were then inserted in the following locations: a 5-mm trocar in the right epigastrium and two 5-mm trocars along the right costal margin. The abdomen was inspected and no abnormalities were found. The table was placed in the reverse Trendelenburg position with the right side up.  Filmy adhesions between the gallbladder and omentum, duodenum and transverse colon were lysed sharply. The dome of the gallbladder was grasped with an  atraumatic grasper passed through the lateral port and retracted over the dome of the liver. The infundibulum was also grasped with an atraumatic grasper through the midclavicular port and retracted toward the right lower quadrant. This maneuver exposed Calot's triangle. The peritoneum overlying the gallbladder infundibulum was then incised and the cystic duct and cystic artery identified and circumferentially dissected. Critical view of safety reviewed before ligating any structure. The cystic duct and cystic artery were then doubly clipped and divided close to the gallbladder.  The gallbladder was then dissected from its peritoneal attachments by electrocautery. Hemostasis was checked and the gallbladder and contained stones were removed using an endoscopic retrieval bag placed through the umbilical port. The gallbladder was passed off the table as a specimen. The gallbladder fossa was copiously irrigated with saline and hemostasis was obtained. There was no evidence of bleeding from the gallbladder fossa or cystic artery or leakage of the bile from the cystic duct stump. Secondary trocars were removed under direct vision. No bleeding was noted. The laparoscope was withdrawn and the umbilical trocar removed. The abdomen was allowed to collapse. The fascia of the 52m trocar sites was closed with figure-of-eight 0 vicryl sutures. The skin was closed with subcuticular sutures of 4-0 monocryl and topical skin adhesive. The orogastric tube was removed.  The patient tolerated the procedure well and was taken to the postanesthesia care unit in stable condition.   Specimen: Gallbladder  Complications: None  EBL: 145m

## 2018-08-22 NOTE — Transfer of Care (Signed)
Immediate Anesthesia Transfer of Care Note  Patient: Mary Beltran  Procedure(s) Performed: LAPAROSCOPIC CHOLECYSTECTOMY (N/A )  Patient Location: PACU  Anesthesia Type:General  Level of Consciousness: drowsy and patient cooperative  Airway & Oxygen Therapy: Patient Spontanous Breathing and Patient connected to face mask oxygen  Post-op Assessment: Report given to RN and Post -op Vital signs reviewed and stable  Post vital signs: Reviewed and stable  Last Vitals:  Vitals Value Taken Time  BP 113/54 08/22/2018  3:37 PM  Temp 36.4 C 08/22/2018  3:37 PM  Pulse 73 08/22/2018  3:38 PM  Resp 14 08/22/2018  3:38 PM  SpO2 100 % 08/22/2018  3:38 PM  Vitals shown include unvalidated device data.  Last Pain:  Vitals:   08/22/18 1240  TempSrc: Temporal  PainSc: 4          Complications: No apparent anesthesia complications

## 2018-08-22 NOTE — Anesthesia Post-op Follow-up Note (Signed)
Anesthesia QCDR form completed.        

## 2018-08-22 NOTE — Anesthesia Preprocedure Evaluation (Signed)
Anesthesia Evaluation  Patient identified by MRN, date of birth, ID band Patient awake    Reviewed: Allergy & Precautions, NPO status , Patient's Chart, lab work & pertinent test results, reviewed documented beta blocker date and time   Airway Mallampati: II  TM Distance: >3 FB     Dental  (+) Chipped   Pulmonary Current Smoker,           Cardiovascular      Neuro/Psych    GI/Hepatic   Endo/Other    Renal/GU      Musculoskeletal   Abdominal   Peds  Hematology   Anesthesia Other Findings   Reproductive/Obstetrics                             Anesthesia Physical Anesthesia Plan  ASA: III  Anesthesia Plan: General   Post-op Pain Management:    Induction: Intravenous  PONV Risk Score and Plan:   Airway Management Planned: Oral ETT  Additional Equipment:   Intra-op Plan:   Post-operative Plan:   Informed Consent: I have reviewed the patients History and Physical, chart, labs and discussed the procedure including the risks, benefits and alternatives for the proposed anesthesia with the patient or authorized representative who has indicated his/her understanding and acceptance.       Plan Discussed with: CRNA  Anesthesia Plan Comments:         Anesthesia Quick Evaluation

## 2018-08-22 NOTE — Anesthesia Procedure Notes (Signed)
Procedure Name: Intubation Date/Time: 08/22/2018 2:31 PM Performed by: Nelda Marseille, CRNA Pre-anesthesia Checklist: Patient identified, Patient being monitored, Timeout performed, Emergency Drugs available and Suction available Patient Re-evaluated:Patient Re-evaluated prior to induction Oxygen Delivery Method: Circle system utilized Preoxygenation: Pre-oxygenation with 100% oxygen Induction Type: IV induction Ventilation: Mask ventilation without difficulty Laryngoscope Size: Mac, 3 and McGraph Grade View: Grade I Tube type: Oral Tube size: 7.0 mm Number of attempts: 1 Airway Equipment and Method: Stylet and Video-laryngoscopy Placement Confirmation: ETT inserted through vocal cords under direct vision,  positive ETCO2 and breath sounds checked- equal and bilateral Secured at: 21 cm Tube secured with: Tape Dental Injury: Teeth and Oropharynx as per pre-operative assessment

## 2018-08-22 NOTE — Anesthesia Postprocedure Evaluation (Signed)
Anesthesia Post Note  Patient: Mary Beltran  Procedure(s) Performed: LAPAROSCOPIC CHOLECYSTECTOMY (N/A )  Patient location during evaluation: PACU Anesthesia Type: General Level of consciousness: awake and alert and oriented Pain management: pain level controlled Vital Signs Assessment: post-procedure vital signs reviewed and stable Respiratory status: spontaneous breathing Cardiovascular status: blood pressure returned to baseline Anesthetic complications: no     Last Vitals:  Vitals:   08/22/18 1537 08/22/18 1552  BP: (!) 113/54 (!) 132/57  Pulse: 78 90  Resp: 14 11  Temp: (!) 36.4 C   SpO2: 100% 100%    Last Pain:  Vitals:   08/22/18 1552  TempSrc:   PainSc: 0-No pain                 Consetta Cosner

## 2018-08-22 NOTE — Interval H&P Note (Signed)
History and Physical Interval Note:  08/22/2018 1:14 PM  Mary Beltran  has presented today for surgery, with the diagnosis of K81.0 ACUTE CHOLECYSTITIS.  The various methods of treatment have been discussed with the patient and family. After consideration of risks, benefits and other options for treatment, the patient has consented to  Procedure(s): LAPAROSCOPIC CHOLECYSTECTOMY (N/A) as a surgical intervention.  The patient's history has been reviewed, patient examined, no change in status, stable for surgery.  I have reviewed the patient's chart and labs.  Questions were answered to the patient's satisfaction.     Herbert Pun

## 2018-08-22 NOTE — H&P (Signed)
SURGICAL CONSULTATION NOTE   HISTORY OF PRESENT ILLNESS (HPI):  78 y.o. female presented to GI office for evaluation of abdominal pain. Patient reports having abdominal pain since 2 to 4 weeks ago.  Has been increasing in the last 2 days.  He went to see her primary care physician who ordered a abdominal ultrasound.  Abdominal ultrasound shows mild gallbladder wall thickening and pericholecystic edema.  I personally reviewed the images.  Since then she has persistently getting worse.  Aggravating factor is oral intake.  Patient reports that every time that she eats the pain gets worse.  When she is n.p.o. with the pain is mild but constant.  She denies any fever or chills.  Pains is on the right upper quadrant and radiates to her right back.  She also had pain on the epigastric area.  She denies diarrhea or constipation.  Labs done 3 days ago and today shows no leukocytosis with chronic anemia.  There is normal liver function test and normal bilirubin.  I personally reviewed the labs.  Surgery is consulted by [facility], [facility] in this context for evaluation and management of acute cholecystitis.  PAST MEDICAL HISTORY ([facility]):  Chronic musculoskeletal pain GERD  PAST SURGICAL HISTORY ([place]):  Upper endoscopy  MEDICATIONS:  Prior to Admission medications   Not on File     ALLERGIES:  Allergies  Allergen Reactions  . Codeine   . Penicillins      SOCIAL HISTORY:  Social History   Socioeconomic History  . Marital status: Divorced    Spouse name: Not on file  . Number of children: Not on file  . Years of education: Not on file  . Highest education level: Not on file  Occupational History  . Not on file  Social Needs  . Financial resource strain: Not on file  . Food insecurity:    Worry: Not on file    Inability: Not on file  . Transportation needs:    Medical: Not on file    Non-medical: Not on file  Tobacco Use  . Smoking status: Current Every Day Smoker  Substance and Sexual  Activity  . Alcohol use: No  . Drug use: Not on file  . Sexual activity: Not on file  Lifestyle  . Physical activity:    Days per week: Not on file    Minutes per session: Not on file  . Stress: Not on file  Relationships  . Social connections:    Talks on phone: Not on file    Gets together: Not on file    Attends religious service: Not on file    Active member of club or organization: Not on file    Attends meetings of clubs or organizations: Not on file    Relationship status: Not on file  . Intimate partner violence:    Fear of current or ex partner: Not on file    Emotionally abused: Not on file    Physically abused: Not on file    Forced sexual activity: Not on file  Other Topics Concern  . Not on file  Social History Narrative  . Not on file    The patient currently resides (home / rehab facility / nursing home): Home The patient normally is (ambulatory / bedbound): Ambulatory   FAMILY HISTORY:  No family history on file.   REVIEW OF SYSTEMS:  Constitutional: denies weight loss, fever, chills, or sweats  Eyes: denies any other vision changes, history of eye injury  ENT:  denies sore throat, hearing problems  Respiratory: denies shortness of breath, wheezing  Cardiovascular: denies chest pain, palpitations  Gastrointestinal: positive abdominal pain, Denies nausea and vomitnig Genitourinary: denies burning with urination or urinary frequency Musculoskeletal: denies any other joint pains or cramps  Skin: denies any other rashes or skin discolorations  Neurological: denies any other headache, dizziness, weakness  Psychiatric: denies any other depression, anxiety   All other review of systems were negative   VITAL SIGNS:  BP: 116/70 Heart rate 103 Respiration 16 Temp 36.4 Weight 62.1 kg  PHYSICAL EXAM:  Constitutional:  -- Normal body habitus  -- Awake, alert, and oriented x3  Eyes:  -- Pupils equally round and reactive to light  -- No scleral icterus   Ear, nose, and throat:  -- No jugular venous distension  Pulmonary:  -- No crackles  -- Equal breath sounds bilaterally -- Breathing non-labored at rest Cardiovascular:  -- S1, S2 present  -- No pericardial rubs Gastrointestinal:  -- Abdomen soft, tender on right upper quadrant, non-distended, no guarding or rebound tenderness -- No abdominal masses appreciated, pulsatile or otherwise  Musculoskeletal and Integumentary:  -- Wounds or skin discoloration: None appreciated -- Extremities: B/L UE and LE FROM, hands and feet warm, no edema  Neurologic:  -- Motor function: intact and symmetric -- Sensation: intact and symmetric   Labs:  CBC Latest Ref Rng & Units 04/23/2015  WBC 3.6 - 11.0 K/uL 11.9(H)  Hemoglobin 12.0 - 16.0 g/dL 12.7  Hematocrit 35.0 - 47.0 % 38.7  Platelets 150 - 440 K/uL 318   CMP Latest Ref Rng & Units 04/23/2015  Glucose 65 - 99 mg/dL 119(H)  BUN 6 - 20 mg/dL 11  Creatinine 0.44 - 1.00 mg/dL 0.67  Sodium 135 - 145 mmol/L 140  Potassium 3.5 - 5.1 mmol/L 3.9  Chloride 101 - 111 mmol/L 106  CO2 22 - 32 mmol/L 23  Calcium 8.9 - 10.3 mg/dL 9.5  Total Protein 6.5 - 8.1 g/dL 7.7  Total Bilirubin 0.3 - 1.2 mg/dL 0.5  Alkaline Phos 38 - 126 U/L 83  AST 15 - 41 U/L 50(H)  ALT 14 - 54 U/L 34   New labs done today shows hemoglobin 9.3, white blood cell count of 7.9 and platelet 386.  Imaging studies:  EXAM: ABDOMEN ULTRASOUND COMPLETE  COMPARISON:  CT abdomen and pelvis April 23, 2015  FINDINGS: Gallbladder: Within the gallbladder, there are echogenic foci which move and shadow consistent with cholelithiasis. Largest gallstone measures 8 mm in length. The gallbladder wall does not appear appreciably thickened. There is minimal pericholecystic fluid. No sonographic Murphy sign noted by sonographer.  Common bile duct: Diameter: 5 mm. No intrahepatic, common hepatic, or common bile dilatation.  Liver: No focal lesion identified. Within normal limits  in parenchymal echogenicity. Portal vein is patent on color Doppler imaging with normal direction of blood flow towards the liver.  IVC: No abnormality visualized.  Pancreas: No pancreatic mass or inflammatory focus.  Spleen: Size and appearance within normal limits.  Right Kidney: Length: 11.6 cm. Echogenicity within normal limits. No mass or hydronephrosis visualized.  Left Kidney: Length: 10.8 cm. Echogenicity within normal limits. No mass or hydronephrosis visualized.  Abdominal aorta: No aneurysm visualized. There is atherosclerotic calcification throughout the aorta.  Other findings: No demonstrable ascites.  IMPRESSION: 1. Cholelithiasis. Equivocal pericholecystic fluid. Gallbladder wall does not appear appreciably thickened. Given concern for early acute cholecystitis, it may be prudent to consider nuclear medicine hepatobiliary imaging study to assess for  cystic duct patency.  2.  Aortic Atherosclerosis (ICD10-I70.0).  3.  Study otherwise unremarkable.  These results will be called to the ordering clinician or representative by the Radiologist Assistant, and communication documented in the PACS or zVision Dashboard.   Electronically Signed   By: Lowella Grip III M.D.   On: 08/20/2018 10:39  Assessment/Plan:  78 y.o. female with acute cholecystitis.  Patient with history, physical exam and images consistent with acute cholecystitis.  She is pretty tender on the right upper quadrant.  Patient oriented about diagnosis and surgical management as treatment.   Discussed the risk of surgery including post-op infxn, seroma, biloma, chronic pain, poor-delayed wound healing, retained gallstone, conversion to open procedure, post-op SBO or ileus, and need for additional procedures to address said risks.  The risks of general anesthetic including MI, CVA, sudden death or even reaction to anesthetic medications also discussed. Alternatives include continued  observation.  Benefits include possible symptom relief, prevention of complications including acute cholecystitis, pancreatitis.  Arnold Long, MD

## 2018-08-23 ENCOUNTER — Encounter: Payer: Self-pay | Admitting: General Surgery

## 2018-08-26 LAB — SURGICAL PATHOLOGY

## 2018-09-08 DIAGNOSIS — D509 Iron deficiency anemia, unspecified: Secondary | ICD-10-CM | POA: Diagnosis not present

## 2018-09-12 DIAGNOSIS — Z9049 Acquired absence of other specified parts of digestive tract: Secondary | ICD-10-CM | POA: Diagnosis not present

## 2018-09-12 DIAGNOSIS — D500 Iron deficiency anemia secondary to blood loss (chronic): Secondary | ICD-10-CM | POA: Diagnosis not present

## 2018-09-30 DIAGNOSIS — Z1159 Encounter for screening for other viral diseases: Secondary | ICD-10-CM | POA: Diagnosis not present

## 2018-10-06 ENCOUNTER — Other Ambulatory Visit: Payer: Self-pay | Admitting: Internal Medicine

## 2018-10-06 DIAGNOSIS — C189 Malignant neoplasm of colon, unspecified: Secondary | ICD-10-CM | POA: Diagnosis not present

## 2018-10-06 DIAGNOSIS — C188 Malignant neoplasm of overlapping sites of colon: Secondary | ICD-10-CM | POA: Diagnosis not present

## 2018-10-06 DIAGNOSIS — K5730 Diverticulosis of large intestine without perforation or abscess without bleeding: Secondary | ICD-10-CM | POA: Diagnosis not present

## 2018-10-06 DIAGNOSIS — D125 Benign neoplasm of sigmoid colon: Secondary | ICD-10-CM | POA: Diagnosis not present

## 2018-10-06 DIAGNOSIS — C182 Malignant neoplasm of ascending colon: Secondary | ICD-10-CM | POA: Diagnosis not present

## 2018-10-06 DIAGNOSIS — R0609 Other forms of dyspnea: Secondary | ICD-10-CM

## 2018-10-06 DIAGNOSIS — C180 Malignant neoplasm of cecum: Secondary | ICD-10-CM | POA: Diagnosis not present

## 2018-10-06 DIAGNOSIS — K6389 Other specified diseases of intestine: Secondary | ICD-10-CM

## 2018-10-06 DIAGNOSIS — K5669 Other partial intestinal obstruction: Secondary | ICD-10-CM | POA: Diagnosis not present

## 2018-10-06 DIAGNOSIS — K635 Polyp of colon: Secondary | ICD-10-CM | POA: Diagnosis not present

## 2018-10-06 DIAGNOSIS — K640 First degree hemorrhoids: Secondary | ICD-10-CM | POA: Diagnosis not present

## 2018-10-06 DIAGNOSIS — K2950 Unspecified chronic gastritis without bleeding: Secondary | ICD-10-CM | POA: Diagnosis not present

## 2018-10-06 DIAGNOSIS — K3189 Other diseases of stomach and duodenum: Secondary | ICD-10-CM | POA: Diagnosis not present

## 2018-10-06 DIAGNOSIS — R5383 Other fatigue: Secondary | ICD-10-CM

## 2018-10-06 DIAGNOSIS — K2970 Gastritis, unspecified, without bleeding: Secondary | ICD-10-CM | POA: Diagnosis not present

## 2018-10-06 DIAGNOSIS — D509 Iron deficiency anemia, unspecified: Secondary | ICD-10-CM | POA: Diagnosis not present

## 2018-10-09 ENCOUNTER — Other Ambulatory Visit: Payer: Self-pay

## 2018-10-09 ENCOUNTER — Ambulatory Visit
Admission: RE | Admit: 2018-10-09 | Discharge: 2018-10-09 | Disposition: A | Discharge status: Home / Self Care | Payer: Medicare HMO | Source: Ambulatory Visit | Attending: Internal Medicine | Admitting: Internal Medicine

## 2018-10-09 DIAGNOSIS — R918 Other nonspecific abnormal finding of lung field: Secondary | ICD-10-CM | POA: Diagnosis not present

## 2018-10-09 DIAGNOSIS — R0609 Other forms of dyspnea: Secondary | ICD-10-CM | POA: Diagnosis present

## 2018-10-09 DIAGNOSIS — J439 Emphysema, unspecified: Secondary | ICD-10-CM | POA: Diagnosis not present

## 2018-10-09 DIAGNOSIS — K6389 Other specified diseases of intestine: Secondary | ICD-10-CM | POA: Diagnosis not present

## 2018-10-09 LAB — POCT I-STAT CREATININE: Creatinine, Ser: 0.6 mg/dL (ref 0.44–1.00)

## 2018-10-09 MED ORDER — IOHEXOL 300 MG/ML  SOLN
100.0000 mL | Freq: Once | INTRAMUSCULAR | Status: AC | PRN
  Administered 2018-10-09: 11:00:00 100 mL via INTRAVENOUS

## 2018-10-13 ENCOUNTER — Inpatient Hospital Stay: Payer: Medicare HMO

## 2018-10-13 ENCOUNTER — Other Ambulatory Visit: Payer: Self-pay

## 2018-10-13 ENCOUNTER — Encounter: Payer: Self-pay | Admitting: Oncology

## 2018-10-13 ENCOUNTER — Inpatient Hospital Stay: Payer: Medicare HMO | Attending: Oncology | Admitting: Oncology

## 2018-10-13 VITALS — BP 129/71 | HR 100 | Temp 97.5°F | Resp 18 | Ht 67.0 in | Wt 132.2 lb

## 2018-10-13 DIAGNOSIS — I700 Atherosclerosis of aorta: Secondary | ICD-10-CM

## 2018-10-13 DIAGNOSIS — Z86010 Personal history of colonic polyps: Secondary | ICD-10-CM | POA: Insufficient documentation

## 2018-10-13 DIAGNOSIS — R5383 Other fatigue: Secondary | ICD-10-CM | POA: Insufficient documentation

## 2018-10-13 DIAGNOSIS — F17210 Nicotine dependence, cigarettes, uncomplicated: Secondary | ICD-10-CM | POA: Insufficient documentation

## 2018-10-13 DIAGNOSIS — R590 Localized enlarged lymph nodes: Secondary | ICD-10-CM | POA: Insufficient documentation

## 2018-10-13 DIAGNOSIS — D509 Iron deficiency anemia, unspecified: Secondary | ICD-10-CM | POA: Insufficient documentation

## 2018-10-13 DIAGNOSIS — Z803 Family history of malignant neoplasm of breast: Secondary | ICD-10-CM | POA: Insufficient documentation

## 2018-10-13 DIAGNOSIS — J439 Emphysema, unspecified: Secondary | ICD-10-CM

## 2018-10-13 DIAGNOSIS — C182 Malignant neoplasm of ascending colon: Secondary | ICD-10-CM | POA: Insufficient documentation

## 2018-10-13 DIAGNOSIS — R69 Illness, unspecified: Secondary | ICD-10-CM | POA: Diagnosis not present

## 2018-10-13 DIAGNOSIS — Z9049 Acquired absence of other specified parts of digestive tract: Secondary | ICD-10-CM | POA: Insufficient documentation

## 2018-10-13 DIAGNOSIS — R109 Unspecified abdominal pain: Secondary | ICD-10-CM | POA: Diagnosis not present

## 2018-10-13 DIAGNOSIS — K5900 Constipation, unspecified: Secondary | ICD-10-CM

## 2018-10-13 DIAGNOSIS — R634 Abnormal weight loss: Secondary | ICD-10-CM | POA: Diagnosis not present

## 2018-10-13 DIAGNOSIS — D500 Iron deficiency anemia secondary to blood loss (chronic): Secondary | ICD-10-CM

## 2018-10-13 DIAGNOSIS — C189 Malignant neoplasm of colon, unspecified: Secondary | ICD-10-CM

## 2018-10-13 HISTORY — DX: Iron deficiency anemia, unspecified: D50.9

## 2018-10-13 LAB — CBC WITH DIFFERENTIAL/PLATELET
Abs Immature Granulocytes: 0.06 10*3/uL (ref 0.00–0.07)
Basophils Absolute: 0.1 10*3/uL (ref 0.0–0.1)
Basophils Relative: 1 %
Eosinophils Absolute: 0.1 10*3/uL (ref 0.0–0.5)
Eosinophils Relative: 1 %
HCT: 29.4 % — ABNORMAL LOW (ref 36.0–46.0)
Hemoglobin: 8.8 g/dL — ABNORMAL LOW (ref 12.0–15.0)
Immature Granulocytes: 1 %
Lymphocytes Relative: 21 %
Lymphs Abs: 1.7 10*3/uL (ref 0.7–4.0)
MCH: 23.4 pg — ABNORMAL LOW (ref 26.0–34.0)
MCHC: 29.9 g/dL — ABNORMAL LOW (ref 30.0–36.0)
MCV: 78.2 fL — ABNORMAL LOW (ref 80.0–100.0)
Monocytes Absolute: 0.6 10*3/uL (ref 0.1–1.0)
Monocytes Relative: 7 %
Neutro Abs: 5.8 10*3/uL (ref 1.7–7.7)
Neutrophils Relative %: 69 %
Platelets: 425 10*3/uL — ABNORMAL HIGH (ref 150–400)
RBC: 3.76 MIL/uL — ABNORMAL LOW (ref 3.87–5.11)
RDW: 15.4 % (ref 11.5–15.5)
WBC: 8.3 10*3/uL (ref 4.0–10.5)
nRBC: 0 % (ref 0.0–0.2)

## 2018-10-13 LAB — IRON AND TIBC
Iron: 20 ug/dL — ABNORMAL LOW (ref 28–170)
Saturation Ratios: 4 % — ABNORMAL LOW (ref 10.4–31.8)
TIBC: 547 ug/dL — ABNORMAL HIGH (ref 250–450)
UIBC: 527 ug/dL

## 2018-10-13 LAB — COMPREHENSIVE METABOLIC PANEL
ALT: 11 U/L (ref 0–44)
AST: 18 U/L (ref 15–41)
Albumin: 4 g/dL (ref 3.5–5.0)
Alkaline Phosphatase: 78 U/L (ref 38–126)
Anion gap: 12 (ref 5–15)
BUN: 9 mg/dL (ref 8–23)
CO2: 21 mmol/L — ABNORMAL LOW (ref 22–32)
Calcium: 8.9 mg/dL (ref 8.9–10.3)
Chloride: 102 mmol/L (ref 98–111)
Creatinine, Ser: 0.67 mg/dL (ref 0.44–1.00)
GFR calc Af Amer: >60 mL/min (ref >60)
GFR calc non Af Amer: >60 mL/min (ref >60)
Glucose, Bld: 106 mg/dL — ABNORMAL HIGH (ref 70–99)
Potassium: 3.9 mmol/L (ref 3.5–5.1)
Sodium: 135 mmol/L (ref 135–145)
Total Bilirubin: 0.5 mg/dL (ref 0.3–1.2)
Total Protein: 7.4 g/dL (ref 6.5–8.1)

## 2018-10-13 LAB — FERRITIN: Ferritin: 6 ng/mL — ABNORMAL LOW (ref 11–307)

## 2018-10-13 NOTE — Progress Notes (Signed)
Hematology/Oncology Consult note [facility] Telephone:([phone] [phone]   Patient Care Team: [name], MD as PCP - General (Internal Medicine) Clent Jacks, RN as Registered Nurse  REFERRING PROVIDER: Efrain Sella, MD  CHIEF COMPLAINTS/REASON FOR VISIT:  Evaluation of: Colon cancer  HISTORY OF PRESENTING ILLNESS:   Mary Beltran is a  78 y.o.  female with PMH listed below was seen in consultation at the request of  Alice Reichert, Benay Pike, MD  for evaluation of colon cancer Patient had acute cholecystitis and had laparoscopic cholecystectomy same day by Dr. Peyton Najjar. Patient was found to have iron deficiency anemia with hemoglobin around 9.2, ferritin 7 and iron 24. Baseline hemoglobin 1 year ago was 13.1.  Patient was started on iron supplementation with Ferrex 150 mg iron capsule daily. Reports to have chronic constipation.  Denies any blood in the stool. Patient was seen by gastroenterology and had endoscopy and colonoscopy done on 10/06/2018. There was a partially obstructing mass in the cecum which was biopsied. 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 Gastric mucosa shows reactive foveolar hyperplasia, stromal fibrosis and focal chronic inflammation.  Negative for H. pylori.  Duodenal biopsy shows no abnormalities.  No evidence of celiac disease.  Sigmoid polyps showed tubular adenoma. 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  Patient reports feeling fatigued.  She has been compliant with oral iron supplementation, overall tolerating except worsening of her constipation. Reports stomach pain which is usually exacerbated after eating.  No blood in the stool. 10 pound weight loss within past few months. Denies any family history colon cancer.  Paternal grandmother passed away due to breast cancer. Per patient's request, I called patient's son Mary Beltran and sister [name], and put  both of them on speaker phone so they can hear the conversation and participate in discussion.  Review of Systems  Constitutional: Positive for fatigue. Negative for appetite change, chills and fever.  HENT:   Negative for hearing loss and voice change.   Eyes: Negative for eye problems.  Respiratory: Negative for chest tightness and cough.   Cardiovascular: Negative for chest pain.  Gastrointestinal: Positive for abdominal pain and constipation. Negative for abdominal distention and blood in stool.  Endocrine: Negative for hot flashes.  Genitourinary: Negative for difficulty urinating and frequency.   Musculoskeletal: Negative for arthralgias.  Skin: Negative for itching and rash.  Neurological: Negative for extremity weakness.  Hematological: Negative for adenopathy.  Psychiatric/Behavioral: Negative for confusion.    MEDICAL HISTORY:  Past Medical History:  Diagnosis Date  . GERD (gastroesophageal reflux disease)     SURGICAL HISTORY: Past Surgical History:  Procedure Laterality Date  . ABDOMINAL HYSTERECTOMY     complete  . APPENDECTOMY    . CHOLECYSTECTOMY N/A 08/22/2018   Procedure: LAPAROSCOPIC CHOLECYSTECTOMY;  Surgeon: Herbert Pun, MD;  Location: [facility] ORS;  Service: General;  Laterality: N/A;  . COLONOSCOPY      SOCIAL HISTORY: Social History   Socioeconomic History  . Marital status: Divorced    Spouse name: Not on file  . Number of children: Not on file  . Years of education: Not on file  . Highest education level: Not on file  Occupational History  . Occupation: retired  Scientific laboratory technician  . Financial resource strain: Not on file  . Food insecurity    Worry: Not on file    Inability: Not on file  . Transportation needs    Medical: Not on file    Non-medical: Not  on file  Tobacco Use  . Smoking status: Current Every Day Smoker    Packs/day: 0.25    Years: 60.00    Pack years: 15.00  . Smokeless tobacco: Never Used  Substance and Sexual  Activity  . Alcohol use: No  . Drug use: Not on file  . Sexual activity: Not on file  Lifestyle  . Physical activity    Days per week: Not on file    Minutes per session: Not on file  . Stress: Not on file  Relationships  . Social Herbalist on phone: Not on file    Gets together: Not on file    Attends religious service: Not on file    Active member of club or organization: Not on file    Attends meetings of clubs or organizations: Not on file    Relationship status: Not on file  . Intimate partner violence    Fear of current or ex partner: Not on file    Emotionally abused: Not on file    Physically abused: Not on file    Forced sexual activity: Not on file  Other Topics Concern  . Not on file  Social History Narrative  . Not on file    FAMILY HISTORY: Family History  Problem Relation Age of Onset  . COPD Mother   . Suicidality Father   . Breast cancer Paternal Grandmother   . Failure to thrive Sister     ALLERGIES:  is allergic to pain & fever [acetaminophen]; sulfa antibiotics; codeine; and penicillins.  MEDICATIONS:  Current Outpatient Medications  Medication Sig Dispense Refill  . gabapentin (NEURONTIN) 300 MG capsule Take 1 capsule (300 mg total) by mouth 3 (three) times daily for 10 days. 30 capsule 0   No current facility-administered medications for this visit.      PHYSICAL EXAMINATION: ECOG PERFORMANCE STATUS: 1 - Symptomatic but completely ambulatory Vitals:   10/13/18 1058  BP: 129/71  Pulse: 100  Resp: 18  Temp: (!) 97.5 F (36.4 C)   Filed Weights   10/13/18 1058  Weight: 132 lb 3.2 oz (60 kg)    Physical Exam Constitutional:      General: She is not in acute distress.    Comments: Thin, walk in   HENT:     Head: Normocephalic and atraumatic.  Eyes:     General: No scleral icterus.    Pupils: Pupils are equal, round, and reactive to light.  Neck:     Musculoskeletal: Normal range of motion and neck supple.   Cardiovascular:     Rate and Rhythm: Normal rate and regular rhythm.     Heart sounds: Normal heart sounds.  Pulmonary:     Effort: Pulmonary effort is normal. No respiratory distress.     Breath sounds: No wheezing.  Abdominal:     General: Bowel sounds are normal. There is no distension.     Palpations: Abdomen is soft. There is no mass.     Tenderness: There is no abdominal tenderness.  Musculoskeletal: Normal range of motion.        General: No deformity.  Skin:    General: Skin is warm and dry.     Coloration: Skin is pale.     Findings: No erythema or rash.  Neurological:     Mental Status: She is alert and oriented to person, place, and time.     Cranial Nerves: No cranial nerve deficit.     Coordination: Coordination normal.  Psychiatric:        Behavior: Behavior normal.        Thought Content: Thought content normal.     LABORATORY DATA:  I have reviewed the data as listed Lab Results  Component Value Date   WBC 8.3 10/13/2018   HGB 8.8 (L) 10/13/2018   HCT 29.4 (L) 10/13/2018   MCV 78.2 (L) 10/13/2018   PLT 425 (H) 10/13/2018   Recent Labs    10/09/18 1100 10/13/18 1154  NA  --  135  K  --  3.9  CL  --  102  CO2  --  21*  GLUCOSE  --  106*  BUN  --  9  CREATININE 0.60 0.67  CALCIUM  --  8.9  GFRNONAA  --  >60  GFRAA  --  >60  PROT  --  7.4  ALBUMIN  --  4.0  AST  --  18  ALT  --  11  ALKPHOS  --  78  BILITOT  --  0.5   Iron/TIBC/Ferritin/ %Sat    Component Value Date/Time   IRON 20 (L) 10/13/2018 1154   TIBC 547 (H) 10/13/2018 1154   FERRITIN 6 (L) 10/13/2018 1154   IRONPCTSAT 4 (L) 10/13/2018 1154      RADIOGRAPHIC STUDIES: I have personally reviewed the radiological images as listed and agreed with the findings in the report.  Ct Chest W Contrast  Result Date: 10/09/2018 CLINICAL DATA:  Possible colon mass seen on colonoscopy 4 days ago. EXAM: CT CHEST, ABDOMEN, AND PELVIS WITH CONTRAST TECHNIQUE: Multidetector CT imaging of the  chest, abdomen and pelvis was performed following the standard protocol during bolus administration of intravenous contrast. CONTRAST:  136m OMNIPAQUE IOHEXOL 300 MG/ML  SOLN COMPARISON:  04/23/2015 FINDINGS: CT CHEST FINDINGS Cardiovascular: The heart size is normal. No substantial pericardial effusion. Coronary artery calcification is evident. Atherosclerotic calcification is noted in the wall of the thoracic aorta. Mediastinum/Nodes: 11 mm short axis precarinal lymph node is stable. Other upper normal mediastinal and hilar lymphadenopathy is similar to prior. The esophagus has normal imaging features. There is no axillary lymphadenopathy. Lungs/Pleura: The central tracheobronchial airways are patent. Centrilobular emphsyema noted. Calcified granuloma noted posterior right upper lobe. Noncalcified 2 mm right upper lobe nodule visible on 57/5, new since prior study. 2 mm right upper lobe nodule also visible on 46/5. 7 mm sub solid left upper lobe nodule (54/5) was 6 mm previously. Musculoskeletal: No worrisome lytic or sclerotic osseous abnormality. CT ABDOMEN PELVIS FINDINGS Hepatobiliary: No suspicious focal abnormality within the liver parenchyma. Gallbladder is surgically absent. No intrahepatic or extrahepatic biliary dilation. Pancreas: No focal mass lesion. No dilatation of the main duct. No intraparenchymal cyst. No peripancreatic edema. Spleen: No splenomegaly. No focal mass lesion. Adrenals/Urinary Tract: Left adrenal thickening evident without discrete nodule or mass. Right adrenal gland unremarkable 5 mm low-density lesion interpolar right kidney is too small to characterize. Similar 3 mm low-density lesion identified lower pole right kidney. Left kidney unremarkable. No evidence for hydroureter. The urinary bladder appears normal for the degree of distention. Stomach/Bowel: Stomach is unremarkable. Areas of apparent wall thickening in the gastric cardia and body of stomach are indeterminate. No  evidence of outlet obstruction. Duodenum is normally positioned as is the ligament of Treitz. No small bowel wall thickening. No small bowel dilatation. Mild wall thickening noted in the terminal ileum and ileocecal valve. The appendix is not visualized, but there is no edema or inflammation in the region of the  cecum. Circumferential wall thickening noted in the cecum at the level of the ileocecal valve. Colonic wall is ill-defined in there is pericolonic soft tissue stranding in this region. Left colon unremarkable. Vascular/Lymphatic: There is abdominal aortic atherosclerosis without aneurysm. There is no gastrohepatic or hepatoduodenal ligament lymphadenopathy. Small lymph nodes are noted in the ileocolic mesentery measuring up to 6 mm short axis (image 89/series 3. No retroperitoneal lymphadenopathy. No pelvic sidewall lymphadenopathy. Reproductive: The uterus is surgically absent. There is no adnexal mass. Other: No intraperitoneal free fluid. Musculoskeletal: No worrisome lytic or sclerotic osseous abnormality. IMPRESSION: 1. Circumferential irregular wall thickening in the cecum, involving the ileocecal valve with some wall thickening in the terminal ileum as well. There is soft tissue stranding around the abnormal cecal wall raising the question of transmural extension of tumor. Small lymph nodes are noted in the adjacent ileocolic mesentery a metastatic disease cannot be excluded. 2. No evidence for hepatic metastases. 3. Tiny pulmonary nodules, some of which are new since 04/13/2015. Attention on follow-up recommended to exclude metastatic involvement. 4.  Aortic Atherosclerois (ICD10-170.0) 5.  Emphysema. ([id number]) Electronically Signed   By: Misty Stanley M.D.   On: 10/09/2018 12:59   Ct Abdomen Pelvis W Contrast  Result Date: 10/09/2018 CLINICAL DATA:  Possible colon mass seen on colonoscopy 4 days ago. EXAM: CT CHEST, ABDOMEN, AND PELVIS WITH CONTRAST TECHNIQUE: Multidetector CT imaging of the  chest, abdomen and pelvis was performed following the standard protocol during bolus administration of intravenous contrast. CONTRAST:  174m OMNIPAQUE IOHEXOL 300 MG/ML  SOLN COMPARISON:  04/23/2015 FINDINGS: CT CHEST FINDINGS Cardiovascular: The heart size is normal. No substantial pericardial effusion. Coronary artery calcification is evident. Atherosclerotic calcification is noted in the wall of the thoracic aorta. Mediastinum/Nodes: 11 mm short axis precarinal lymph node is stable. Other upper normal mediastinal and hilar lymphadenopathy is similar to prior. The esophagus has normal imaging features. There is no axillary lymphadenopathy. Lungs/Pleura: The central tracheobronchial airways are patent. Centrilobular emphsyema noted. Calcified granuloma noted posterior right upper lobe. Noncalcified 2 mm right upper lobe nodule visible on 57/5, new since prior study. 2 mm right upper lobe nodule also visible on 46/5. 7 mm sub solid left upper lobe nodule (54/5) was 6 mm previously. Musculoskeletal: No worrisome lytic or sclerotic osseous abnormality. CT ABDOMEN PELVIS FINDINGS Hepatobiliary: No suspicious focal abnormality within the liver parenchyma. Gallbladder is surgically absent. No intrahepatic or extrahepatic biliary dilation. Pancreas: No focal mass lesion. No dilatation of the main duct. No intraparenchymal cyst. No peripancreatic edema. Spleen: No splenomegaly. No focal mass lesion. Adrenals/Urinary Tract: Left adrenal thickening evident without discrete nodule or mass. Right adrenal gland unremarkable 5 mm low-density lesion interpolar right kidney is too small to characterize. Similar 3 mm low-density lesion identified lower pole right kidney. Left kidney unremarkable. No evidence for hydroureter. The urinary bladder appears normal for the degree of distention. Stomach/Bowel: Stomach is unremarkable. Areas of apparent wall thickening in the gastric cardia and body of stomach are indeterminate. No  evidence of outlet obstruction. Duodenum is normally positioned as is the ligament of Treitz. No small bowel wall thickening. No small bowel dilatation. Mild wall thickening noted in the terminal ileum and ileocecal valve. The appendix is not visualized, but there is no edema or inflammation in the region of the cecum. Circumferential wall thickening noted in the cecum at the level of the ileocecal valve. Colonic wall is ill-defined in there is pericolonic soft tissue stranding in this region. Left colon unremarkable. Vascular/Lymphatic: There  is abdominal aortic atherosclerosis without aneurysm. There is no gastrohepatic or hepatoduodenal ligament lymphadenopathy. Small lymph nodes are noted in the ileocolic mesentery measuring up to 6 mm short axis (image 89/series 3. No retroperitoneal lymphadenopathy. No pelvic sidewall lymphadenopathy. Reproductive: The uterus is surgically absent. There is no adnexal mass. Other: No intraperitoneal free fluid. Musculoskeletal: No worrisome lytic or sclerotic osseous abnormality. IMPRESSION: 1. Circumferential irregular wall thickening in the cecum, involving the ileocecal valve with some wall thickening in the terminal ileum as well. There is soft tissue stranding around the abnormal cecal wall raising the question of transmural extension of tumor. Small lymph nodes are noted in the adjacent ileocolic mesentery a metastatic disease cannot be excluded. 2. No evidence for hepatic metastases. 3. Tiny pulmonary nodules, some of which are new since 04/13/2015. Attention on follow-up recommended to exclude metastatic involvement. 4.  Aortic Atherosclerois (ICD10-170.0) 5.  Emphysema. ([id number]) Electronically Signed   By: Misty Stanley M.D.   On: 10/09/2018 12:59      ASSESSMENT & PLAN:  1. Malignant neoplasm of ascending colon ([place])   2. Iron deficiency anemia due to chronic blood loss    #CT images were independently reviewed by me and discussed with patient.  Pathology was reviewed and discussed with patient. The diagnosis of colon cancer and care plan were discussed with patient in detail.  NCCN guidelines were reviewed and shared with patient.   Patient has ascending colon adenocarcinoma CT images reveals no distant metastasis except subcentimeter small lung nodules which are nonspecific findings. Questionable soft tissue stranding around abnormal cecal wall raising question of transmural extension of tumor.  Small lymph nodes are noted in the adjacent ileocolic mesentery, questionable metastasis.  Clinically she has locally advanced colon cancer.  Discussed with patient and her family members that I recommend patient to establish care with surgery Dr. Peyton Najjar to discuss about surgical resection. Depending on her final pathological staging, will determine whether she will need adjuvant chemotherapy.  Recommend to obtain baseline CEA, CBC, CMP.  #Severe iron deficiency anemia, has been on oral iron supplementation. Iron deficient most likely secondary to ongoing chronic blood loss. We discussed about repeating blood work, including iron study to see if she has any improvement. If no improvement I would recommend IV iron treatments to further optimize her preop condition. Plan IV iron with Venofer 219m weekly x 4 doses. Allergy reactions/infusion reaction including anaphylactic reaction discussed with patient. Other side effects include but not limited to high blood pressure, skin rash, weight gain, leg swelling, etc. Patient voices understanding and willing to proceed.   Communicated with Dr.Cintron. her surgery was scheduled on 7/16.    Supportive care measures are necessary for patient well-being and will be provided as necessary. We spent sufficient time to discuss many aspect of care, questions were answered to patient's satisfaction.   Orders Placed This Encounter  Procedures  . CBC with Differential/Platelet    Standing Status:    Future    Number of Occurrences:   1    Standing Expiration Date:   10/13/2019  . CEA    Standing Status:   Future    Number of Occurrences:   1    Standing Expiration Date:   10/13/2019  . Comprehensive metabolic panel    Standing Status:   Future    Number of Occurrences:   1    Standing Expiration Date:   10/13/2019  . Iron and TIBC    Standing Status:   Future  Number of Occurrences:   1    Standing Expiration Date:   10/13/2019  . Ferritin    Standing Status:   Future    Number of Occurrences:   1    Standing Expiration Date:   10/13/2019    All questions were answered. The patient knows to call the clinic with any problems questions or concerns.  cc Efrain Sella, MD    Return of visit: follow up on 10/30/2018 to discuss pathology results.  Thank you for this kind referral and the opportunity to participate in the care of this patient. A copy of today's note is routed to referring provider  Total face to face encounter time for this patient visit was 60 min. >50% of the time was  spent in counseling and coordination of care.    [name], MD, PhD Hematology Oncology [facility] Pager- [phone] 10/13/2018

## 2018-10-13 NOTE — Progress Notes (Signed)
Patient here for initial visit. She complains of stomach pains after eating.

## 2018-10-13 NOTE — Progress Notes (Signed)
Met with Mary Beltran during and after consult with Dr. Tasia Catchings. Introduced nurse navigator services and provided contact information for future needs. No barriers at this time. She has her appointment with surgeon tomorrow and will have additional labs today.

## 2018-10-13 NOTE — Research (Signed)
Dr. Tasia Catchings referred patient to [facility] study "Procurement of Human Biospecimens for the Discovery and Validation of Biomarkers for the Prediction, Diagnosis and Management of Disease."  Dr. Tasia Catchings introduced the study to the patient.  I met with patient and explained the study, explaining that the study requires patient give 3 vials of blood for research purposes.  The vials go to [employer] and they provide a gift card for compensation.  I explained that the blood has to be obtained before any treatment for cancer including chemotherapy, radiation, surgery, etc.  Patient states she is so overwhelmed and has some concerns over giving the three vials due to her having anemia.  I explained that research is completely optional and she could decline.  She stated that at this time, she does not want to participate.  She apologized and I again emphasized that the study is optional and we understand.   Lula Olszewski Clinical oncology research associate 10/13/2018 12:13

## 2018-10-14 ENCOUNTER — Ambulatory Visit: Payer: Self-pay | Admitting: General Surgery

## 2018-10-14 DIAGNOSIS — C182 Malignant neoplasm of ascending colon: Secondary | ICD-10-CM | POA: Diagnosis not present

## 2018-10-14 LAB — CEA: CEA: 3.2 ng/mL (ref 0.0–4.7)

## 2018-10-14 NOTE — H&P (View-Only) (Signed)
PATIENT PROFILE: Mary Beltran is a 78 y.o. female who presents to the Clinic for consultation at the request of Dr. 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  PCP:  Harrold Donath, MD  HISTORY OF PRESENT ILLNESS: Mary Beltran reports presented to GI for evaluation of anemia.  The patient presented with hemoglobin around 9 which her usual hemoglobin baseline was around 13.  She had colonoscopy done last week and was found with a mass in the cecum and in the proximal ascending colon.  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  Patient reports constipation.  She denies any previous colonoscopy.  To report weight loss of 10 pounds recently  Had recent laparoscopic cholecystectomy 7 weeks ago.  She recovered well.  She is still have persistent episodic abdominal pain.  Patient has family history of breast cancer.  Denies family history of colon cancer.   PROBLEM LIST:         Problem List  Date Reviewed: 04/23/2017         Noted   Healthcare maintenance 04/23/2017   Overview    Declines all screening endeavors including ct's of her chest. Zoster illness times 3. Declines other vaccine  MEDICARE WELLNESS VISIT   PROVIDERS RENDERING CARE Dr. Ouida Sills   FUNCTIONAL ASSESSMENT  (1) Hearing: Demonstrates normal hearing in conversation.  (2) Risk of Falls: No reports of falls or abnormal balance. Gait is observed to be good upon observation.  (3) Home Safety; Home is safe and secure (4) Activities of Daily Living; Household chores and grooming are managed without problems. Personal finances are managed without problems.   DEPRESSION SCREENING There does not seem to be loss of interest in activities nor excess crying or changes in sleep or appetite.   COGNITIVE SCREENING Orientation is appropriate as are responses to questions and general conversation. No reports of forgetfulness or losing things.    PREVENTION  PLAN Declines all vaccines and screening endeavors Smoking Cessation: NA   OTHER PERSONALIZED HEALTH ADVISE      Goals    . Exercise (x goals) (pt-stated)     5x weekly     . Maintain health/healthy lifestyle (pt-stated)    . Patient Goals (pt-stated)     Reduce feet and leg cramps         END OF LIFE CARE WANTS No long term support wanted       Frazier Richards MD             Cramps, muscle, general 04/23/2017   Overview    Better with mag         GENERAL REVIEW OF SYSTEMS:   General ROS: negative for - chills, fever, weight gain or weight loss.  Positive for fatigue Allergy and Immunology ROS: negative for - hives  Hematological and Lymphatic ROS: negative for - bleeding problems or bruising, negative for palpable nodes Endocrine ROS: negative for - heat or cold intolerance, hair changes Respiratory ROS: negative for - cough, shortness of breath or wheezing Cardiovascular ROS: no chest pain or palpitations GI ROS: negative for nausea, vomiting, diarrhea, positive for pain and constipation Musculoskeletal ROS: negative for - joint swelling or muscle pain Neurological ROS: negative for - confusion, syncope Dermatological ROS: negative for pruritus and rash Psychiatric: negative for anxiety, depression, difficulty sleeping and memory loss  MEDICATIONS: CurrentMedications        Current Outpatient Medications  Medication Sig Dispense Refill  . iron polysaccharides ([place])  150 mg iron capsule Take 1 capsule (150 mg total) by mouth once daily 30 capsule 11   No current facility-administered medications for this visit.       ALLERGIES: Acetaminophen; Neomycin-bacitracin-polymyxin; Codeine; Penicillins; and Sulfa (sulfonamide antibiotics)  PAST MEDICAL HISTORY:     Past Medical History:  Diagnosis Date  . Cramps, muscle, general 04/23/2017   Better with mag    PAST SURGICAL HISTORY:      Past Surgical  History:  Procedure Laterality Date  . APPENDECTOMY    . CHOLECYSTECTOMY  08/22/2018   DR Lesli Albee  . COLONOSCOPY  10/06/2018   Invasive Adenocarcinoma, moderately differentiated/Repeat 26yrTKT  . EGD  10/06/2018   Chronic inflammation/No Repeat/TKT     FAMILY HISTORY:      Family History  Problem Relation Age of Onset  . Glaucoma Mother   . Ulcers Father   . Cancer Sister   . Thyroid disease Sister   . Goiter Sister   . Heart disease Paternal Grandfather      SOCIAL HISTORY: Social History          Socioeconomic History  . Marital status: Divorced    Spouse name: Not on file  . Number of children: Not on file  . Years of education: Not on file  . Highest education level: Not on file  Occupational History  . Not on file  Social Needs  . Financial resource strain: Not on file  . Food insecurity:    Worry: Not on file    Inability: Not on file  . Transportation needs:    Medical: Not on file    Non-medical: Not on file  Tobacco Use  . Smoking status: Current Every Day Smoker    Years: 60.00    Types: Cigarettes  . Smokeless tobacco: Never Used  Substance and Sexual Activity  . Alcohol use: No  . Drug use: No  . Sexual activity: Not on file  Other Topics Concern  . Not on file  Social History Narrative  . Not on file      PHYSICAL EXAM: There were no vitals filed for this visit. Body mass index is 21.3 kg/m. Weight: 61.7 kg (136 lb)   GENERAL: Alert, active, oriented x3  HEENT: Pupils equal reactive to light. Extraocular movements are intact. Sclera clear. Palpebral conjunctiva normal red color.Pharynx clear.  NECK: Supple with no palpable mass and no adenopathy.  LUNGS: Sound clear with no rales rhonchi or wheezes.  HEART: Regular rhythm S1 and S2 without murmur.  ABDOMEN: Soft and depressible, nontender with no palpable mass, no hepatomegaly.  Scars well-healed.  EXTREMITIES: Well-developed  well-nourished symmetrical with no dependent edema.  NEUROLOGICAL: Awake alert oriented, facial expression symmetrical, moving all extremities.  REVIEW OF DATA: I have reviewed the following data today:      Office Visit on 09/08/2018  Component Date Value  . WBC (White Blood Cell Co* 09/08/2018 9.1   . RBC (Red Blood Cell Coun* 09/08/2018 3.67*  . Hemoglobin 09/08/2018 9.2*  . Hematocrit 09/08/2018 29.8*  . MCV (Mean Corpuscular Vo* 09/08/2018 81.2   . MCH (Mean Corpuscular He* 09/08/2018 25.1*  . MCHC (Mean Corpuscular H* 09/08/2018 30.9*  . Platelet Count 09/08/2018 435   . RDW-CV (Red Cell Distrib* 09/08/2018 14.3   . MPV (Mean Platelet Volum* 09/08/2018 9.7   . Neutrophils 09/08/2018 6.54   . Lymphocytes 09/08/2018 1.60   . Monocytes 09/08/2018 0.78   . Eosinophils 09/08/2018 0.14   . Basophils 09/08/2018  0.05   . Neutrophil % 09/08/2018 71.8*  . Lymphocyte % 09/08/2018 17.5   . Monocyte % 09/08/2018 8.6   . Eosinophil % 09/08/2018 1.5   . Basophil% 09/08/2018 0.5   . Immature Granulocyte % 09/08/2018 0.1   . Immature Granulocyte Cou* 09/08/2018 0.01   . Iron 09/08/2018 24*  . Ferritin 09/08/2018 7*  Initial consult on 08/22/2018  Component Date Value  . WBC (White Blood Cell Co* 08/22/2018 7.9   . RBC (Red Blood Cell Coun* 08/22/2018 3.53*  . Hemoglobin 08/22/2018 9.3*  . Hematocrit 08/22/2018 29.5*  . MCV (Mean Corpuscular Vo* 08/22/2018 83.6   . MCH (Mean Corpuscular He* 08/22/2018 26.3*  . MCHC (Mean Corpuscular H* 08/22/2018 31.5*  . Platelet Count 08/22/2018 386   . RDW-CV (Red Cell Distrib* 08/22/2018 14.1   . MPV (Mean Platelet Volum* 08/22/2018 9.4   . Neutrophils 08/22/2018 5.41   . Lymphocytes 08/22/2018 1.57   . Monocytes 08/22/2018 0.67   . Eosinophils 08/22/2018 0.15   . Basophils 08/22/2018 0.05   . Neutrophil % 08/22/2018 68.8   . Lymphocyte % 08/22/2018 19.9   . Monocyte % 08/22/2018 8.5   . Eosinophil % 08/22/2018 1.9   . Basophil%  08/22/2018 0.6   . Immature Granulocyte % 08/22/2018 0.3   . Immature Granulocyte Cou* 08/22/2018 0.02   . Protein, Total 08/22/2018 7.1   . Albumin 08/22/2018 4.0   . Bilirubin, Total 08/22/2018 0.3   . Bilirubin, Conjugated 08/22/2018 0.06   . Alk Phos (alkaline Phosp* 08/22/2018 75   . AST  08/22/2018 13   . ALT  08/22/2018 7   Office Visit on 08/19/2018  Component Date Value  . WBC (White Blood Cell Co* 08/19/2018 10.2   . RBC (Red Blood Cell Coun* 08/19/2018 3.60*  . Hemoglobin 08/19/2018 9.3*  . Hematocrit 08/19/2018 30.1*  . MCV (Mean Corpuscular Vo* 08/19/2018 83.6   . MCH (Mean Corpuscular He* 08/19/2018 25.8*  . MCHC (Mean Corpuscular H* 08/19/2018 30.9*  . Platelet Count 08/19/2018 405   . RDW-CV (Red Cell Distrib* 08/19/2018 14.0   . MPV (Mean Platelet Volum* 08/19/2018 9.5   . Neutrophils 08/19/2018 7.33   . Lymphocytes 08/19/2018 1.94   . Monocytes 08/19/2018 0.66   . Eosinophils 08/19/2018 0.21   . Basophils 08/19/2018 0.05   . Neutrophil % 08/19/2018 71.6*  . Lymphocyte % 08/19/2018 19.0   . Monocyte % 08/19/2018 6.5   . Eosinophil % 08/19/2018 2.1   . Basophil% 08/19/2018 0.5   . Immature Granulocyte % 08/19/2018 0.3   . Immature Granulocyte Cou* 08/19/2018 0.03   . Glucose 08/19/2018 90   . Sodium 08/19/2018 134*  . Potassium 08/19/2018 4.3   . Chloride 08/19/2018 102   . Carbon Dioxide (CO2) 08/19/2018 26.5   . Urea Nitrogen (BUN) 08/19/2018 13   . Creatinine 08/19/2018 0.6   . Glomerular Filtration Ra* 08/19/2018 97   . Calcium 08/19/2018 9.3   . AST  08/19/2018 14   . ALT  08/19/2018 9   . Alk Phos (alkaline Phosp* 08/19/2018 72   . Albumin 08/19/2018 3.9   . Bilirubin, Total 08/19/2018 0.2*  . Protein, Total 08/19/2018 6.6   . A/G Ratio 08/19/2018 1.4   . Lipase 08/19/2018 30   . Helicobacter Pylori Anti* 08/19/2018 Negative      ASSESSMENT: Ms. Lahmann is a 78 y.o. female presenting for consultation for ascending colon cancer.     Patient with microcytic anemia and she was found  with a mass in the ascending colon on colonoscopy that biopsy showed adenocarcinoma.  Patient was oriented about the diagnosis of colon cancer.  Patient has CT scan of the abdominal pelvis that showed no distant metastatic disease.  I personally evaluated the images.  Preoperative CEA is 3.2 which is within normal limits.  Today will be used for postoperative follow-up in the long-term.  It was oriented about surgical management.  I discussed with her the technique of laparoscopic hand-assisted colectomy.  I discussed with the risk of surgery that include bowel injury, bowel obstruction, abscess, injury to other structures such as ureter, kidneys, other organs and vasculature.  Other risk such as bleeding and wound infection.  Patient also oriented about the possibility of having this and the need of NGT.  Other risk of anesthesia including lung complications, cardiac obligations and DVT.  I had a long discussion with her and her family about the goals of postoperative care in the hospital which includes pain control, ambulation, voiding spontaneously, tolerating diet and passing gas.  The patient was evaluated by oncologist which agreed to proceed with surgery.  We will wait for final specimen pathology for decision of chemotherapy.  Patient oriented about the bowel prep the day before the surgery.  Malignant neoplasm of ascending colon (CMS-HCC) [C18.2]  PLAN: 1. Laparoscopic hand assisted right hemicolectomy (49447) 2. CBC, CMP done 10/13/2018 3. Internal medicine clearance.  4. Avoid taking aspirin 5 days before surgery  5. Take the bowel prep the day before surgery as instructed.  6. Contact us if has any question or concern.   Patient verbalized understanding, all questions were answered, and were agreeable with the plan outlined above.   Herbert Pun, MD  Electronically signed by Herbert Pun, MD

## 2018-10-14 NOTE — H&P (Signed)
PATIENT PROFILE: Mary Beltran is a 78 y.o. female who presents to the Clinic for consultation at the request of Dr. 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  PCP:  Harrold Donath, MD  HISTORY OF PRESENT ILLNESS: [name] reports presented to GI for evaluation of anemia.  The patient presented with hemoglobin around 9 which her usual hemoglobin baseline was around 13.  She had colonoscopy done last week and was found with a mass in the cecum and in the proximal ascending colon.  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  Patient reports constipation.  She denies any previous colonoscopy.  To report weight loss of 10 pounds recently  Had recent laparoscopic cholecystectomy 7 weeks ago.  She recovered well.  She is still have persistent episodic abdominal pain.  Patient has family history of breast cancer.  Denies family history of colon cancer.   PROBLEM LIST:         Problem List  Date Reviewed: 04/23/2017         Noted   Healthcare maintenance 04/23/2017   Overview    Declines all screening endeavors including ct's of her chest. Zoster illness times 3. Declines other vaccine  MEDICARE WELLNESS VISIT   PROVIDERS RENDERING CARE Dr. Ouida Sills   FUNCTIONAL ASSESSMENT  (1) Hearing: Demonstrates normal hearing in conversation.  (2) Risk of Falls: No reports of falls or abnormal balance. Gait is observed to be good upon observation.  (3) Home Safety; Home is safe and secure (4) Activities of Daily Living; Household chores and grooming are managed without problems. Personal finances are managed without problems.   DEPRESSION SCREENING There does not seem to be loss of interest in activities nor excess crying or changes in sleep or appetite.   COGNITIVE SCREENING Orientation is appropriate as are responses to questions and general conversation. No reports of forgetfulness or losing things.    PREVENTION  PLAN Declines all vaccines and screening endeavors Smoking Cessation: NA   OTHER PERSONALIZED HEALTH ADVISE      Goals    . Exercise (x goals) (pt-stated)     5x weekly     . Maintain health/healthy lifestyle (pt-stated)    . Patient Goals (pt-stated)     Reduce feet and leg cramps         END OF LIFE CARE WANTS No long term support wanted       Frazier Richards MD             Cramps, muscle, general 04/23/2017   Overview    Better with mag         GENERAL REVIEW OF SYSTEMS:   General ROS: negative for - chills, fever, weight gain or weight loss.  Positive for fatigue Allergy and Immunology ROS: negative for - hives  Hematological and Lymphatic ROS: negative for - bleeding problems or bruising, negative for palpable nodes Endocrine ROS: negative for - heat or cold intolerance, hair changes Respiratory ROS: negative for - cough, shortness of breath or wheezing Cardiovascular ROS: no chest pain or palpitations GI ROS: negative for nausea, vomiting, diarrhea, positive for pain and constipation Musculoskeletal ROS: negative for - joint swelling or muscle pain Neurological ROS: negative for - confusion, syncope Dermatological ROS: negative for pruritus and rash Psychiatric: negative for anxiety, depression, difficulty sleeping and memory loss  MEDICATIONS: CurrentMedications        Current Outpatient Medications  Medication Sig Dispense Refill  . iron polysaccharides ([place])  150 mg iron capsule Take 1 capsule (150 mg total) by mouth once daily 30 capsule 11   No current facility-administered medications for this visit.       ALLERGIES: Acetaminophen; Neomycin-bacitracin-polymyxin; Codeine; Penicillins; and Sulfa (sulfonamide antibiotics)  PAST MEDICAL HISTORY:     Past Medical History:  Diagnosis Date  . Cramps, muscle, general 04/23/2017   Better with mag    PAST SURGICAL HISTORY:      Past Surgical  History:  Procedure Laterality Date  . APPENDECTOMY    . CHOLECYSTECTOMY  08/22/2018   DR Lesli Albee  . COLONOSCOPY  10/06/2018   Invasive Adenocarcinoma, moderately differentiated/Repeat 72yrTKT  . EGD  10/06/2018   Chronic inflammation/No Repeat/TKT     FAMILY HISTORY:      Family History  Problem Relation Age of Onset  . Glaucoma Mother   . Ulcers Father   . Cancer Sister   . Thyroid disease Sister   . Goiter Sister   . Heart disease Paternal Grandfather      SOCIAL HISTORY: Social History          Socioeconomic History  . Marital status: Divorced    Spouse name: Not on file  . Number of children: Not on file  . Years of education: Not on file  . Highest education level: Not on file  Occupational History  . Not on file  Social Needs  . Financial resource strain: Not on file  . Food insecurity:    Worry: Not on file    Inability: Not on file  . Transportation needs:    Medical: Not on file    Non-medical: Not on file  Tobacco Use  . Smoking status: Current Every Day Smoker    Years: 60.00    Types: Cigarettes  . Smokeless tobacco: Never Used  Substance and Sexual Activity  . Alcohol use: No  . Drug use: No  . Sexual activity: Not on file  Other Topics Concern  . Not on file  Social History Narrative  . Not on file      PHYSICAL EXAM: There were no vitals filed for this visit. Body mass index is 21.3 kg/m. Weight: 61.7 kg (136 lb)   GENERAL: Alert, active, oriented x3  HEENT: Pupils equal reactive to light. Extraocular movements are intact. Sclera clear. Palpebral conjunctiva normal red color.Pharynx clear.  NECK: Supple with no palpable mass and no adenopathy.  LUNGS: Sound clear with no rales rhonchi or wheezes.  HEART: Regular rhythm S1 and S2 without murmur.  ABDOMEN: Soft and depressible, nontender with no palpable mass, no hepatomegaly.  Scars well-healed.  EXTREMITIES: Well-developed  well-nourished symmetrical with no dependent edema.  NEUROLOGICAL: Awake alert oriented, facial expression symmetrical, moving all extremities.  REVIEW OF DATA: I have reviewed the following data today:      Office Visit on 09/08/2018  Component Date Value  . WBC (White Blood Cell Co* 09/08/2018 9.1   . RBC (Red Blood Cell Coun* 09/08/2018 3.67*  . Hemoglobin 09/08/2018 9.2*  . Hematocrit 09/08/2018 29.8*  . MCV (Mean Corpuscular Vo* 09/08/2018 81.2   . MCH (Mean Corpuscular He* 09/08/2018 25.1*  . MCHC (Mean Corpuscular H* 09/08/2018 30.9*  . Platelet Count 09/08/2018 435   . RDW-CV (Red Cell Distrib* 09/08/2018 14.3   . MPV (Mean Platelet Volum* 09/08/2018 9.7   . Neutrophils 09/08/2018 6.54   . Lymphocytes 09/08/2018 1.60   . Monocytes 09/08/2018 0.78   . Eosinophils 09/08/2018 0.14   . Basophils 09/08/2018  0.05   . Neutrophil % 09/08/2018 71.8*  . Lymphocyte % 09/08/2018 17.5   . Monocyte % 09/08/2018 8.6   . Eosinophil % 09/08/2018 1.5   . Basophil% 09/08/2018 0.5   . Immature Granulocyte % 09/08/2018 0.1   . Immature Granulocyte Cou* 09/08/2018 0.01   . Iron 09/08/2018 24*  . Ferritin 09/08/2018 7*  Initial consult on 08/22/2018  Component Date Value  . WBC (White Blood Cell Co* 08/22/2018 7.9   . RBC (Red Blood Cell Coun* 08/22/2018 3.53*  . Hemoglobin 08/22/2018 9.3*  . Hematocrit 08/22/2018 29.5*  . MCV (Mean Corpuscular Vo* 08/22/2018 83.6   . MCH (Mean Corpuscular He* 08/22/2018 26.3*  . MCHC (Mean Corpuscular H* 08/22/2018 31.5*  . Platelet Count 08/22/2018 386   . RDW-CV (Red Cell Distrib* 08/22/2018 14.1   . MPV (Mean Platelet Volum* 08/22/2018 9.4   . Neutrophils 08/22/2018 5.41   . Lymphocytes 08/22/2018 1.57   . Monocytes 08/22/2018 0.67   . Eosinophils 08/22/2018 0.15   . Basophils 08/22/2018 0.05   . Neutrophil % 08/22/2018 68.8   . Lymphocyte % 08/22/2018 19.9   . Monocyte % 08/22/2018 8.5   . Eosinophil % 08/22/2018 1.9   . Basophil%  08/22/2018 0.6   . Immature Granulocyte % 08/22/2018 0.3   . Immature Granulocyte Cou* 08/22/2018 0.02   . Protein, Total 08/22/2018 7.1   . Albumin 08/22/2018 4.0   . Bilirubin, Total 08/22/2018 0.3   . Bilirubin, Conjugated 08/22/2018 0.06   . Alk Phos (alkaline Phosp* 08/22/2018 75   . AST  08/22/2018 13   . ALT  08/22/2018 7   Office Visit on 08/19/2018  Component Date Value  . WBC (White Blood Cell Co* 08/19/2018 10.2   . RBC (Red Blood Cell Coun* 08/19/2018 3.60*  . Hemoglobin 08/19/2018 9.3*  . Hematocrit 08/19/2018 30.1*  . MCV (Mean Corpuscular Vo* 08/19/2018 83.6   . MCH (Mean Corpuscular He* 08/19/2018 25.8*  . MCHC (Mean Corpuscular H* 08/19/2018 30.9*  . Platelet Count 08/19/2018 405   . RDW-CV (Red Cell Distrib* 08/19/2018 14.0   . MPV (Mean Platelet Volum* 08/19/2018 9.5   . Neutrophils 08/19/2018 7.33   . Lymphocytes 08/19/2018 1.94   . Monocytes 08/19/2018 0.66   . Eosinophils 08/19/2018 0.21   . Basophils 08/19/2018 0.05   . Neutrophil % 08/19/2018 71.6*  . Lymphocyte % 08/19/2018 19.0   . Monocyte % 08/19/2018 6.5   . Eosinophil % 08/19/2018 2.1   . Basophil% 08/19/2018 0.5   . Immature Granulocyte % 08/19/2018 0.3   . Immature Granulocyte Cou* 08/19/2018 0.03   . Glucose 08/19/2018 90   . Sodium 08/19/2018 134*  . Potassium 08/19/2018 4.3   . Chloride 08/19/2018 102   . Carbon Dioxide (CO2) 08/19/2018 26.5   . Urea Nitrogen (BUN) 08/19/2018 13   . Creatinine 08/19/2018 0.6   . Glomerular Filtration Ra* 08/19/2018 97   . Calcium 08/19/2018 9.3   . AST  08/19/2018 14   . ALT  08/19/2018 9   . Alk Phos (alkaline Phosp* 08/19/2018 72   . Albumin 08/19/2018 3.9   . Bilirubin, Total 08/19/2018 0.2*  . Protein, Total 08/19/2018 6.6   . A/G Ratio 08/19/2018 1.4   . Lipase 08/19/2018 30   . Helicobacter Pylori Anti* 08/19/2018 Negative      ASSESSMENT: Ms. Childers is a 78 y.o. female presenting for consultation for ascending colon cancer.     Patient with microcytic anemia and she was found  with a mass in the ascending colon on colonoscopy that biopsy showed adenocarcinoma.  Patient was oriented about the diagnosis of colon cancer.  Patient has CT scan of the abdominal pelvis that showed no distant metastatic disease.  I personally evaluated the images.  Preoperative CEA is 3.2 which is within normal limits.  Today will be used for postoperative follow-up in the long-term.  It was oriented about surgical management.  I discussed with her the technique of laparoscopic hand-assisted colectomy.  I discussed with the risk of surgery that include bowel injury, bowel obstruction, abscess, injury to other structures such as ureter, kidneys, other organs and vasculature.  Other risk such as bleeding and wound infection.  Patient also oriented about the possibility of having this and the need of NGT.  Other risk of anesthesia including lung complications, cardiac obligations and DVT.  I had a long discussion with her and her family about the goals of postoperative care in the hospital which includes pain control, ambulation, voiding spontaneously, tolerating diet and passing gas.  The patient was evaluated by oncologist which agreed to proceed with surgery.  We will wait for final specimen pathology for decision of chemotherapy.  Patient oriented about the bowel prep the day before the surgery.  Malignant neoplasm of ascending colon (CMS-HCC) [C18.2]  PLAN: 1. Laparoscopic hand assisted right hemicolectomy (82956) 2. CBC, CMP done 10/13/2018 3. Internal medicine clearance.  4. Avoid taking aspirin 5 days before surgery  5. Take the bowel prep the day before surgery as instructed.  6. Contact us if has any question or concern.   Patient verbalized understanding, all questions were answered, and were agreeable with the plan outlined above.   Herbert Pun, MD  Electronically signed by Herbert Pun, MD

## 2018-10-16 ENCOUNTER — Other Ambulatory Visit
Admission: RE | Admit: 2018-10-16 | Discharge: 2018-10-16 | Disposition: A | Discharge status: Home / Self Care | Payer: Medicare HMO | Source: Ambulatory Visit | Attending: General Surgery | Admitting: General Surgery

## 2018-10-16 ENCOUNTER — Other Ambulatory Visit: Payer: Self-pay

## 2018-10-16 DIAGNOSIS — C182 Malignant neoplasm of ascending colon: Secondary | ICD-10-CM | POA: Diagnosis not present

## 2018-10-16 DIAGNOSIS — Z01818 Encounter for other preprocedural examination: Secondary | ICD-10-CM | POA: Diagnosis not present

## 2018-10-16 HISTORY — DX: Other complications of anesthesia, initial encounter: T88.59XA

## 2018-10-16 HISTORY — DX: Other specified postprocedural states: Z98.890

## 2018-10-16 HISTORY — DX: Nausea with vomiting, unspecified: R11.2

## 2018-10-16 NOTE — Patient Instructions (Signed)
Your procedure is scheduled on: 10/23/18 Report to Day Surgery.[facility]OND FLOOR To find out your arrival time please call [phone] between 1PM - 3PM on 10/22/18.  Remember: Instructions that are not followed completely may result in serious medical risk,  up to and including death, or upon the discretion of your surgeon and anesthesiologist your  surgery may need to be rescheduled.     _X__ 1. Do not eat food after midnight the night before your procedure.                 No gum chewing or hard candies. You may drink clear liquids up to 2 hours                 before you are scheduled to arrive for your surgery- DO not drink clear                 liquids within 2 hours of the start of your surgery.                 Clear Liquids include:  water, apple juice without pulp, clear carbohydrate                 drink such as Clearfast of Gatorade, Black Coffee or Tea (Do not add                 anything to coffee or tea).  __X__2.  On the morning of surgery brush your teeth with toothpaste and water, you                may rinse your mouth with mouthwash if you wish.  Do not swallow any toothpaste of mouthwash.     _X__ 3.  No Alcohol for 24 hours before or after surgery.   _X__ 4.  Do Not Smoke or use e-cigarettes For 24 Hours Prior to Your Surgery.                 Do not use any chewable tobacco products for at least 6 hours prior to                 surgery.  ____  5.  Bring all medications with you on the day of surgery if instructed.   _X___  6.  Notify your doctor if there is any change in your medical condition      (cold, fever, infections).     Do not wear jewelry, make-up, hairpins, clips or nail polish. Do not wear lotions, powders, or perfumes. You may wear deodorant. Do not shave 48 hours prior to surgery. Men may shave face and neck. Do not bring valuables to the hospital.    [facility] is not responsible for any belongings or  valuables.  Contacts, dentures or bridgework may not be worn into surgery. Leave your suitcase in the car. After surgery it may be brought to your room. For patients admitted to the hospital, discharge time is determined by your treatment team.   Patients discharged the day of surgery will not be allowed to drive home.   Please read over the following fact sheets that you were given:   Surgical Site Infection Prevention          ____ Take these medicines the morning of surgery with A SIP OF WATER:    1. NONE  2.   3.   4.  5.  6.  ____ Fleet Enema (as directed)   _X___  Use CHG Soap as directed  ____ Use inhalers on the day of surgery  ____ Stop metformin 2 days prior to surgery    ____ Take 1/2 of usual insulin dose the night before surgery. No insulin the morning          of surgery.   ____ Stop Coumadin/Plavix/aspirin on   ____ Stop Anti-inflammatories on    ____ Stop supplements until after surgery.    ____ Bring C-Pap to the hospital.

## 2018-10-17 ENCOUNTER — Inpatient Hospital Stay: Payer: Medicare HMO

## 2018-10-17 ENCOUNTER — Other Ambulatory Visit: Payer: Self-pay

## 2018-10-17 VITALS — BP 109/71 | HR 87 | Temp 98.2°F | Resp 18

## 2018-10-17 DIAGNOSIS — K5900 Constipation, unspecified: Secondary | ICD-10-CM | POA: Diagnosis not present

## 2018-10-17 DIAGNOSIS — C182 Malignant neoplasm of ascending colon: Secondary | ICD-10-CM | POA: Diagnosis not present

## 2018-10-17 DIAGNOSIS — I700 Atherosclerosis of aorta: Secondary | ICD-10-CM | POA: Diagnosis not present

## 2018-10-17 DIAGNOSIS — D500 Iron deficiency anemia secondary to blood loss (chronic): Secondary | ICD-10-CM

## 2018-10-17 DIAGNOSIS — R5383 Other fatigue: Secondary | ICD-10-CM | POA: Diagnosis not present

## 2018-10-17 DIAGNOSIS — J439 Emphysema, unspecified: Secondary | ICD-10-CM | POA: Diagnosis not present

## 2018-10-17 DIAGNOSIS — R69 Illness, unspecified: Secondary | ICD-10-CM | POA: Diagnosis not present

## 2018-10-17 DIAGNOSIS — D509 Iron deficiency anemia, unspecified: Secondary | ICD-10-CM | POA: Diagnosis not present

## 2018-10-17 DIAGNOSIS — R109 Unspecified abdominal pain: Secondary | ICD-10-CM | POA: Diagnosis not present

## 2018-10-17 DIAGNOSIS — R634 Abnormal weight loss: Secondary | ICD-10-CM | POA: Diagnosis not present

## 2018-10-17 DIAGNOSIS — R590 Localized enlarged lymph nodes: Secondary | ICD-10-CM | POA: Diagnosis not present

## 2018-10-17 MED ORDER — SODIUM CHLORIDE 0.9 % IV SOLN
Freq: Once | INTRAVENOUS | Status: AC
  Administered 2018-10-17: 14:00:00 via INTRAVENOUS
  Filled 2018-10-17: qty 250

## 2018-10-17 MED ORDER — IRON SUCROSE 20 MG/ML IV SOLN
200.0000 mg | Freq: Once | INTRAVENOUS | Status: AC
  Administered 2018-10-17: 200 mg via INTRAVENOUS
  Filled 2018-10-17: qty 10

## 2018-10-20 ENCOUNTER — Other Ambulatory Visit: Payer: Self-pay

## 2018-10-20 ENCOUNTER — Other Ambulatory Visit
Admission: RE | Admit: 2018-10-20 | Discharge: 2018-10-20 | Disposition: A | Discharge status: Home / Self Care | Payer: Medicare HMO | Source: Ambulatory Visit | Attending: General Surgery | Admitting: General Surgery

## 2018-10-20 DIAGNOSIS — Z1159 Encounter for screening for other viral diseases: Secondary | ICD-10-CM | POA: Diagnosis not present

## 2018-10-21 ENCOUNTER — Other Ambulatory Visit: Payer: Self-pay

## 2018-10-21 ENCOUNTER — Inpatient Hospital Stay: Payer: Medicare HMO

## 2018-10-21 VITALS — BP 115/72 | HR 82 | Resp 18

## 2018-10-21 DIAGNOSIS — D500 Iron deficiency anemia secondary to blood loss (chronic): Secondary | ICD-10-CM

## 2018-10-21 LAB — SARS CORONAVIRUS 2 (TAT 6-24 HRS): SARS Coronavirus 2: NEGATIVE

## 2018-10-21 MED ORDER — SODIUM CHLORIDE 0.9 % IV SOLN
Freq: Once | INTRAVENOUS | Status: AC
  Administered 2018-10-21: 14:00:00 via INTRAVENOUS
  Filled 2018-10-21: qty 250

## 2018-10-21 MED ORDER — IRON SUCROSE 20 MG/ML IV SOLN
200.0000 mg | Freq: Once | INTRAVENOUS | Status: AC
  Administered 2018-10-21: 200 mg via INTRAVENOUS
  Filled 2018-10-21: qty 10

## 2018-10-23 ENCOUNTER — Inpatient Hospital Stay: Payer: Medicare HMO | Admitting: Anesthesiology

## 2018-10-23 ENCOUNTER — Inpatient Hospital Stay
Admission: RE | Admit: 2018-10-23 | Discharge: 2018-10-27 | DRG: 331 | Disposition: A | Discharge status: Home / Self Care | Payer: Medicare HMO | Attending: General Surgery | Admitting: General Surgery

## 2018-10-23 ENCOUNTER — Encounter: Payer: Self-pay | Admitting: *Deleted

## 2018-10-23 ENCOUNTER — Other Ambulatory Visit: Payer: Self-pay

## 2018-10-23 ENCOUNTER — Encounter
Admission: RE | Disposition: A | Discharge status: Home / Self Care | Payer: Self-pay | Source: Home / Self Care | Attending: General Surgery

## 2018-10-23 DIAGNOSIS — C182 Malignant neoplasm of ascending colon: Secondary | ICD-10-CM | POA: Diagnosis not present

## 2018-10-23 DIAGNOSIS — F17210 Nicotine dependence, cigarettes, uncomplicated: Secondary | ICD-10-CM | POA: Diagnosis present

## 2018-10-23 DIAGNOSIS — Z79899 Other long term (current) drug therapy: Secondary | ICD-10-CM

## 2018-10-23 DIAGNOSIS — D509 Iron deficiency anemia, unspecified: Secondary | ICD-10-CM | POA: Diagnosis present

## 2018-10-23 DIAGNOSIS — D120 Benign neoplasm of cecum: Secondary | ICD-10-CM | POA: Diagnosis not present

## 2018-10-23 DIAGNOSIS — K219 Gastro-esophageal reflux disease without esophagitis: Secondary | ICD-10-CM | POA: Diagnosis not present

## 2018-10-23 DIAGNOSIS — K5900 Constipation, unspecified: Secondary | ICD-10-CM | POA: Diagnosis present

## 2018-10-23 DIAGNOSIS — R69 Illness, unspecified: Secondary | ICD-10-CM | POA: Diagnosis not present

## 2018-10-23 DIAGNOSIS — D122 Benign neoplasm of ascending colon: Secondary | ICD-10-CM | POA: Diagnosis not present

## 2018-10-23 HISTORY — PX: LAPAROSCOPIC RIGHT COLECTOMY: SHX5925

## 2018-10-23 LAB — CBC
HCT: 27.2 % — ABNORMAL LOW (ref 36.0–46.0)
HCT: 30.3 % — ABNORMAL LOW (ref 36.0–46.0)
Hemoglobin: 8.5 g/dL — ABNORMAL LOW (ref 12.0–15.0)
Hemoglobin: 9.2 g/dL — ABNORMAL LOW (ref 12.0–15.0)
MCH: 24.4 pg — ABNORMAL LOW (ref 26.0–34.0)
MCH: 24.1 pg — ABNORMAL LOW (ref 26.0–34.0)
MCHC: 31.3 g/dL (ref 30.0–36.0)
MCHC: 30.4 g/dL (ref 30.0–36.0)
MCV: 77.9 fL — ABNORMAL LOW (ref 80.0–100.0)
MCV: 79.5 fL — ABNORMAL LOW (ref 80.0–100.0)
Platelets: 371 10*3/uL (ref 150–400)
Platelets: 386 10*3/uL (ref 150–400)
RBC: 3.49 MIL/uL — ABNORMAL LOW (ref 3.87–5.11)
RBC: 3.81 MIL/uL — ABNORMAL LOW (ref 3.87–5.11)
RDW: 16.9 % — ABNORMAL HIGH (ref 11.5–15.5)
RDW: 16.8 % — ABNORMAL HIGH (ref 11.5–15.5)
WBC: 6.9 10*3/uL (ref 4.0–10.5)
WBC: 18.1 10*3/uL — ABNORMAL HIGH (ref 4.0–10.5)
nRBC: 0 % (ref 0.0–0.2)
nRBC: 0 % (ref 0.0–0.2)

## 2018-10-23 LAB — TYPE AND SCREEN
ABO/RH(D): O POS
Antibody Screen: NEGATIVE

## 2018-10-23 SURGERY — COLECTOMY, RIGHT, LAPAROSCOPIC
Anesthesia: General

## 2018-10-23 MED ORDER — GABAPENTIN 300 MG PO CAPS
300.0000 mg | ORAL_CAPSULE | Freq: Two times a day (BID) | ORAL | Status: DC
  Administered 2018-10-23 – 2018-10-27 (×8): 300 mg via ORAL
  Filled 2018-10-23 (×8): qty 1

## 2018-10-23 MED ORDER — PHENYLEPHRINE HCL (PRESSORS) 10 MG/ML IV SOLN
INTRAVENOUS | Status: DC | PRN
  Administered 2018-10-23: 200 ug via INTRAVENOUS
  Administered 2018-10-23: 100 ug via INTRAVENOUS

## 2018-10-23 MED ORDER — POLYETHYLENE GLYCOL 3350 17 G PO PACK
17.0000 g | PACK | Freq: Every day | ORAL | Status: DC
  Administered 2018-10-24: 17 g via ORAL
  Filled 2018-10-23: qty 1

## 2018-10-23 MED ORDER — SODIUM CHLORIDE (PF) 0.9 % IJ SOLN
INTRAMUSCULAR | Status: AC
  Filled 2018-10-23: qty 50

## 2018-10-23 MED ORDER — SODIUM CHLORIDE 0.9 % IV SOLN
INTRAVENOUS | Status: DC | PRN
  Administered 2018-10-23: 60 mL

## 2018-10-23 MED ORDER — FENTANYL CITRATE (PF) 100 MCG/2ML IJ SOLN
INTRAMUSCULAR | Status: AC
  Filled 2018-10-23: qty 2

## 2018-10-23 MED ORDER — FENTANYL CITRATE (PF) 100 MCG/2ML IJ SOLN
25.0000 ug | INTRAMUSCULAR | Status: DC | PRN
  Administered 2018-10-23 – 2018-10-24 (×2): 25 ug via INTRAVENOUS
  Filled 2018-10-23 (×2): qty 2

## 2018-10-23 MED ORDER — BUPIVACAINE LIPOSOME 1.3 % IJ SUSP
INTRAMUSCULAR | Status: AC
  Filled 2018-10-23: qty 20

## 2018-10-23 MED ORDER — FAMOTIDINE 20 MG PO TABS
ORAL_TABLET | ORAL | Status: AC
  Administered 2018-10-23: 07:00:00 20 mg via ORAL
  Filled 2018-10-23: qty 1

## 2018-10-23 MED ORDER — FENTANYL CITRATE (PF) 100 MCG/2ML IJ SOLN
25.0000 ug | INTRAMUSCULAR | Status: DC | PRN
  Administered 2018-10-23 (×5): 25 ug via INTRAVENOUS

## 2018-10-23 MED ORDER — CLINDAMYCIN PHOSPHATE 900 MG/50ML IV SOLN
900.0000 mg | INTRAVENOUS | Status: AC
  Administered 2018-10-23: 900 mg via INTRAVENOUS

## 2018-10-23 MED ORDER — ONDANSETRON 4 MG PO TBDP
4.0000 mg | ORAL_TABLET | Freq: Four times a day (QID) | ORAL | Status: DC | PRN
  Administered 2018-10-23: 4 mg via ORAL
  Filled 2018-10-23: qty 1

## 2018-10-23 MED ORDER — ROCURONIUM BROMIDE 100 MG/10ML IV SOLN
INTRAVENOUS | Status: DC | PRN
  Administered 2018-10-23 (×2): 10 mg via INTRAVENOUS

## 2018-10-23 MED ORDER — KETOROLAC TROMETHAMINE 30 MG/ML IJ SOLN
15.0000 mg | Freq: Four times a day (QID) | INTRAMUSCULAR | Status: DC | PRN
  Administered 2018-10-24 – 2018-10-25 (×5): 15 mg via INTRAVENOUS
  Filled 2018-10-23 (×5): qty 1

## 2018-10-23 MED ORDER — ROCURONIUM BROMIDE 100 MG/10ML IV SOLN
INTRAVENOUS | Status: AC
  Filled 2018-10-23: qty 5

## 2018-10-23 MED ORDER — ONDANSETRON HCL 4 MG/2ML IJ SOLN
4.0000 mg | Freq: Four times a day (QID) | INTRAMUSCULAR | Status: DC | PRN
  Administered 2018-10-23 – 2018-10-25 (×4): 4 mg via INTRAVENOUS
  Filled 2018-10-23 (×5): qty 2

## 2018-10-23 MED ORDER — SEVOFLURANE IN SOLN
RESPIRATORY_TRACT | Status: AC
  Filled 2018-10-23: qty 250

## 2018-10-23 MED ORDER — GABAPENTIN 300 MG PO CAPS
ORAL_CAPSULE | ORAL | Status: AC
  Administered 2018-10-23: 300 mg via ORAL
  Filled 2018-10-23: qty 1

## 2018-10-23 MED ORDER — LACTATED RINGERS IV SOLN
INTRAVENOUS | Status: DC
  Administered 2018-10-23 (×2): via INTRAVENOUS

## 2018-10-23 MED ORDER — PROPOFOL 10 MG/ML IV BOLUS
INTRAVENOUS | Status: AC
  Filled 2018-10-23: qty 20

## 2018-10-23 MED ORDER — SUGAMMADEX SODIUM 200 MG/2ML IV SOLN
INTRAVENOUS | Status: DC | PRN
  Administered 2018-10-23: 200 mg via INTRAVENOUS

## 2018-10-23 MED ORDER — ALVIMOPAN 12 MG PO CAPS
ORAL_CAPSULE | ORAL | Status: AC
  Administered 2018-10-23: 07:00:00 12 mg via ORAL
  Filled 2018-10-23: qty 1

## 2018-10-23 MED ORDER — ALVIMOPAN 12 MG PO CAPS
12.0000 mg | ORAL_CAPSULE | ORAL | Status: AC
  Administered 2018-10-23: 07:00:00 12 mg via ORAL

## 2018-10-23 MED ORDER — METRONIDAZOLE IN NACL 5-0.79 MG/ML-% IV SOLN
500.0000 mg | Freq: Three times a day (TID) | INTRAVENOUS | Status: AC
  Administered 2018-10-23: 16:00:00 500 mg via INTRAVENOUS
  Filled 2018-10-23: qty 100

## 2018-10-23 MED ORDER — KETOROLAC TROMETHAMINE 30 MG/ML IJ SOLN
30.0000 mg | Freq: Four times a day (QID) | INTRAMUSCULAR | Status: AC
  Administered 2018-10-23: 30 mg via INTRAVENOUS
  Filled 2018-10-23: qty 1

## 2018-10-23 MED ORDER — ALVIMOPAN 12 MG PO CAPS
12.0000 mg | ORAL_CAPSULE | Freq: Two times a day (BID) | ORAL | Status: DC
  Administered 2018-10-24: 10:00:00 12 mg via ORAL
  Filled 2018-10-23 (×2): qty 1

## 2018-10-23 MED ORDER — CLINDAMYCIN PHOSPHATE 900 MG/50ML IV SOLN
INTRAVENOUS | Status: AC
  Filled 2018-10-23: qty 50

## 2018-10-23 MED ORDER — BUPIVACAINE HCL (PF) 0.5 % IJ SOLN
INTRAMUSCULAR | Status: AC
  Filled 2018-10-23: qty 30

## 2018-10-23 MED ORDER — SODIUM CHLORIDE 0.9 % IV SOLN
INTRAVENOUS | Status: DC
  Administered 2018-10-23 – 2018-10-26 (×5): via INTRAVENOUS

## 2018-10-23 MED ORDER — KETOROLAC TROMETHAMINE 30 MG/ML IJ SOLN: 30.0000 mg | Freq: Four times a day (QID) | INTRAMUSCULAR | Status: DC | PRN

## 2018-10-23 MED ORDER — FENTANYL CITRATE (PF) 100 MCG/2ML IJ SOLN
INTRAMUSCULAR | Status: AC
  Administered 2018-10-23: 25 ug via INTRAVENOUS
  Filled 2018-10-23: qty 2

## 2018-10-23 MED ORDER — MIDAZOLAM HCL 2 MG/2ML IJ SOLN
INTRAMUSCULAR | Status: AC
  Filled 2018-10-23: qty 2

## 2018-10-23 MED ORDER — CIPROFLOXACIN IN D5W 400 MG/200ML IV SOLN
400.0000 mg | Freq: Two times a day (BID) | INTRAVENOUS | Status: AC
  Administered 2018-10-23: 18:00:00 400 mg via INTRAVENOUS
  Filled 2018-10-23: qty 200

## 2018-10-23 MED ORDER — BUPIVACAINE HCL (PF) 0.5 % IJ SOLN
INTRAMUSCULAR | Status: DC | PRN
  Administered 2018-10-23: 30 mL

## 2018-10-23 MED ORDER — FENTANYL CITRATE (PF) 100 MCG/2ML IJ SOLN
INTRAMUSCULAR | Status: DC | PRN
  Administered 2018-10-23 (×4): 50 ug via INTRAVENOUS

## 2018-10-23 MED ORDER — ENOXAPARIN SODIUM 40 MG/0.4ML ~~LOC~~ SOLN
40.0000 mg | SUBCUTANEOUS | Status: DC
  Administered 2018-10-24 – 2018-10-26 (×3): 40 mg via SUBCUTANEOUS
  Filled 2018-10-23 (×3): qty 0.4

## 2018-10-23 MED ORDER — GABAPENTIN 300 MG PO CAPS
300.0000 mg | ORAL_CAPSULE | ORAL | Status: AC
  Administered 2018-10-23: 07:00:00 300 mg via ORAL

## 2018-10-23 MED ORDER — FAMOTIDINE 20 MG PO TABS
20.0000 mg | ORAL_TABLET | Freq: Once | ORAL | Status: AC
  Administered 2018-10-23: 07:00:00 20 mg via ORAL

## 2018-10-23 MED ORDER — ONDANSETRON HCL 4 MG/2ML IJ SOLN
INTRAMUSCULAR | Status: DC | PRN
  Administered 2018-10-23: 4 mg via INTRAVENOUS

## 2018-10-23 MED ORDER — GENTAMICIN SULFATE 40 MG/ML IJ SOLN
5.0000 mg/kg | INTRAVENOUS | Status: AC
  Administered 2018-10-23: 300 mg via INTRAVENOUS
  Filled 2018-10-23: qty 7.5

## 2018-10-23 MED ORDER — PANTOPRAZOLE SODIUM 40 MG IV SOLR
40.0000 mg | Freq: Every day | INTRAVENOUS | Status: DC
  Administered 2018-10-23 – 2018-10-25 (×3): 40 mg via INTRAVENOUS
  Filled 2018-10-23 (×3): qty 40

## 2018-10-23 MED ORDER — DEXAMETHASONE SODIUM PHOSPHATE 10 MG/ML IJ SOLN
INTRAMUSCULAR | Status: DC | PRN
  Administered 2018-10-23: 10 mg via INTRAVENOUS

## 2018-10-23 MED ORDER — PROMETHAZINE HCL 25 MG/ML IJ SOLN: 6.2500 mg | INTRAMUSCULAR | Status: DC | PRN

## 2018-10-23 SURGICAL SUPPLY — 58 items
BLADE SURG SZ10 CARB STEEL (BLADE) ×3 IMPLANT
CANISTER SUCT 1200ML W/VALVE (MISCELLANEOUS) ×3 IMPLANT
COVER WAND RF STERILE (DRAPES) ×3 IMPLANT
DECANTER SPIKE VIAL GLASS SM (MISCELLANEOUS) IMPLANT
DEFOGGER SCOPE WARMER CLEARIFY (MISCELLANEOUS) ×3 IMPLANT
DERMABOND ADVANCED (GAUZE/BANDAGES/DRESSINGS) ×2
DERMABOND ADVANCED .7 DNX12 (GAUZE/BANDAGES/DRESSINGS) ×1 IMPLANT
ELECT BLADE 6.5 EXT (BLADE) IMPLANT
ELECT CAUTERY BLADE 6.4 (BLADE) ×3 IMPLANT
ELECT REM PT RETURN 9FT ADLT (ELECTROSURGICAL) ×3
ELECTRODE REM PT RTRN 9FT ADLT (ELECTROSURGICAL) ×1 IMPLANT
GLOVE BIO SURGEON STRL SZ 6.5 (GLOVE) ×8 IMPLANT
GLOVE BIO SURGEONS STRL SZ 6.5 (GLOVE) ×4
GLOVE INDICATOR 6.5 STRL GRN (GLOVE) ×30 IMPLANT
GOWN STRL REUS W/ TWL LRG LVL3 (GOWN DISPOSABLE) ×6 IMPLANT
GOWN STRL REUS W/TWL LRG LVL3 (GOWN DISPOSABLE) ×12
HANDLE SUCTION POOLE (INSTRUMENTS) IMPLANT
HANDLE YANKAUER SUCT BULB TIP (MISCELLANEOUS) ×3 IMPLANT
HOLDER FOLEY CATH W/STRAP (MISCELLANEOUS) ×3 IMPLANT
IRRIGATION STRYKERFLOW (MISCELLANEOUS) IMPLANT
IRRIGATOR STRYKERFLOW (MISCELLANEOUS)
NEEDLE HYPO 22GX1.5 SAFETY (NEEDLE) ×3 IMPLANT
NS IRRIG 1000ML POUR BTL (IV SOLUTION) ×3 IMPLANT
PACK COLON CLEAN CLOSURE (MISCELLANEOUS) ×3 IMPLANT
PACK LAP CHOLECYSTECTOMY (MISCELLANEOUS) ×3 IMPLANT
PENCIL ELECTRO HAND CTR (MISCELLANEOUS) ×3 IMPLANT
RELOAD PROXIMATE 75MM BLUE (ENDOMECHANICALS) ×9 IMPLANT
RELOAD PROXIMATE TA60MM BLUE (ENDOMECHANICALS) IMPLANT
RETRACTOR WOUND ALXS 18CM MED (MISCELLANEOUS) IMPLANT
RTRCTR WOUND ALEXIS O 18CM MED (MISCELLANEOUS)
SCISSORS METZENBAUM CVD 33 (INSTRUMENTS) IMPLANT
SHEARS HARMONIC ACE PLUS 36CM (ENDOMECHANICALS) ×3 IMPLANT
SLEEVE ENDOPATH XCEL 5M (ENDOMECHANICALS) ×3 IMPLANT
SPONGE LAP 18X18 RF (DISPOSABLE) ×3 IMPLANT
SPONGE LAP 18X36 RFD (DISPOSABLE) IMPLANT
STAPLER GUN LINEAR PROX 60 (STAPLE) IMPLANT
STAPLER PROXIMATE 75MM BLUE (STAPLE) ×3 IMPLANT
SUCTION POOLE HANDLE (INSTRUMENTS)
SURGILUBE 2OZ TUBE FLIPTOP (MISCELLANEOUS) IMPLANT
SUT MNCRL 4-0 (SUTURE) ×4
SUT MNCRL 4-0 27XMFL (SUTURE) ×2
SUT PDS AB 1 TP1 96 (SUTURE) IMPLANT
SUT PDS PLUS 0 (SUTURE) ×4
SUT PDS PLUS AB 0 CT-2 (SUTURE) ×2 IMPLANT
SUT SILK 2 0 (SUTURE) ×2
SUT SILK 2 0 SH CR/8 (SUTURE) ×3 IMPLANT
SUT SILK 2-0 30XBRD TIE 12 (SUTURE) ×1 IMPLANT
SUT VIC AB 3-0 SH 27 (SUTURE) ×2
SUT VIC AB 3-0 SH 27X BRD (SUTURE) ×1 IMPLANT
SUT VICRYL 3-0 CR8 SH (SUTURE) IMPLANT
SUTURE MNCRL 4-0 27XMF (SUTURE) ×2 IMPLANT
SYR 30ML LL (SYRINGE) IMPLANT
SYS LAPSCP GELPORT 120MM (MISCELLANEOUS)
SYSTEM LAPSCP GELPORT 120MM (MISCELLANEOUS) IMPLANT
TOWEL OR 17X26 4PK STRL BLUE (TOWEL DISPOSABLE) ×3 IMPLANT
TRAY FOLEY SLVR 16FR LF STAT (SET/KITS/TRAYS/PACK) ×3 IMPLANT
TROCAR XCEL NON-BLD 5MMX100MML (ENDOMECHANICALS) ×3 IMPLANT
TUBING EVAC SMOKE HEATED PNEUM (TUBING) ×3 IMPLANT

## 2018-10-23 NOTE — Op Note (Signed)
Preoperative diagnosis: Carcinoma of ascending colon  Postoperative diagnosis: Carcinoma of ascending colon  Procedure: Laparoscopic (hand assisted) right hemicolectomy with primary anastomosis.   Anesthesia: GETA  Surgeon: Dr. Windell Moment, MD  Assistant Surgeon: Dr. Lysle Pearl  Wound Classification: Clean Contaminated  Indications: Patient is a 78 y.o. female with symptoms of rectal bleeding was found to have carcinoma involving the ascending colon. Resection was indicated for oncologic treatment.   Findings: 1. Palpable mass at the cecum 2. Tension free anastomosis 3. Adequate gross circulation on anastomosis 4. No gross metastatic disease identified 5. Adequate hemostasis  Details of operation: The patient was brought to the operating room and placed on the operating table in the supine position. The abdomen was clipped, prepped with 2% chlorhexidine solution, and draped in the standard fashion. A time-out was completed verifying correct patient, procedure, site, positioning, and implant(s) and/or special equipment prior to beginning this procedure. A vertical 5 cm periumbilical incision was used to access the peritoneal cavity and a Gelport was placed.  Under direct visualization a 34m left upper quadrant trocar was introduced and pneumoperitoneum was achieved. Another 5 mm trocar was introduced on the epigastric area. The patient was then positioned in 30 reverse Trendelenburg position with the patient's right side up to allow for small bowel to drop clear of the operative field. The 5-mm scope was introduced from the epigastric port at the initial part of the procedure. With a hand assisted technique the dissection was started at the proximal transverse colon and was followed to the hepatic flexure and proximally through the ascending colon line of Toldt.  The hepatic flexure was mobilized down until the duodenum was identified. The Duodenum and its attachments were swept downward. The C  loop of the duodenum as well as a head of the pancreas were fully visualized with dissected and preserved. This dissection was continued in the avascular plane until the liver or gallbladder were encountered.  The lateral attachments of the ascending colon were then divided including mobilization of the retroperitoneal attachments of the terminal ileum.  A grasper was placed on the ascending colon and then delivered and extracted through the Gelport incision. The small bowel and colon were then divided extracorporeally using a GIA device. The mesenteric dissection was completed from the small bowel to the colonic side with harmonic device and the specimen resection was thus completed. The specimen was removed. The two ends of the ileum and transverse colon were then aligned, ensuring that the mesentery was not twisted, and a stapled side to side anastomosis was created in the usual manner. The mesenteric defect was then closed with Silk 3-0 sutures and the bowel reintroduced into the peritoneal cavity.  The abdominal cavity was again insufflated for final inspection.  No bleeding was identified.  Anastomosis was seen relaxed on the right upper quadrant. The surgeon, the assistant and the surgical tech change the gown and gloves for clean closure.  The fascia was then closed with PDS 0. The skin was closed with 4-0 Monocryl sutures in a running fashion and a dry sterile dressing was applied. The sponge and instrument count was correct, blood loss was minimal, and there were no complications. The patient tolerated the procedure well and was transferred to the post-anesthesia care unit in stable condition.   Specimen: Right Colon and anastomosis  Complications: None  Estimated Blood Loss: 25 mL

## 2018-10-23 NOTE — Anesthesia Preprocedure Evaluation (Addendum)
Anesthesia Evaluation  Patient identified by MRN, date of birth, ID band Patient awake    Reviewed: Allergy & Precautions, H&P , NPO status , Patient's Chart, lab work & pertinent test results  History of Anesthesia Complications (+) PONV and history of anesthetic complications  Airway Mallampati: II  TM Distance: >3 FB Neck ROM: full    Dental  (+) Edentulous Upper, Edentulous Lower   Pulmonary neg shortness of breath, neg recent URI, Current Smoker,           Cardiovascular (-) hypertension(-) angina(-) Cardiac Stents and (-) CABG negative cardio ROS  (-) dysrhythmias      Neuro/Psych neg Seizures negative neurological ROS  negative psych ROS   GI/Hepatic Neg liver ROS, GERD  ,Adenocarcinoma of colon   Endo/Other  negative endocrine ROS  Renal/GU      Musculoskeletal   Abdominal   Peds  Hematology  (+) Blood dyscrasia (Hgb 8.8), anemia ,   Anesthesia Other Findings Past Medical History: No date: Complication of anesthesia     Comment:  NOT WITH GB No date: GERD (gastroesophageal reflux disease) 10/13/2018: IDA (iron deficiency anemia) No date: PONV (postoperative nausea and vomiting)  Past Surgical History: No date: ABDOMINAL HYSTERECTOMY     Comment:  complete No date: APPENDECTOMY 08/22/2018: CHOLECYSTECTOMY; N/A     Comment:  Procedure: LAPAROSCOPIC CHOLECYSTECTOMY;  Surgeon:               Herbert Pun, MD;  Location: [facility] ORS;  Service:              General;  Laterality: N/A; No date: COLONOSCOPY No date: PERCUTANEOUS PINNING WRIST FRACTURE; Right No date: WRIST FOREIGN BODY REMOVAL; Right     Comment:  2016     Reproductive/Obstetrics negative OB ROS                           Anesthesia Physical Anesthesia Plan  ASA: III  Anesthesia Plan: General ETT   Post-op Pain Management:    Induction:   PONV Risk Score and Plan: Ondansetron, Dexamethasone and  Treatment may vary due to age or medical condition  Airway Management Planned:   Additional Equipment:   Intra-op Plan:   Post-operative Plan:   Informed Consent: I have reviewed the patients History and Physical, chart, labs and discussed the procedure including the risks, benefits and alternatives for the proposed anesthesia with the patient or authorized representative who has indicated his/her understanding and acceptance.     Dental Advisory Given  Plan Discussed with: Anesthesiologist and CRNA  Anesthesia Plan Comments:        Anesthesia Quick Evaluation

## 2018-10-23 NOTE — Anesthesia Post-op Follow-up Note (Signed)
Anesthesia QCDR form completed.        

## 2018-10-23 NOTE — Interval H&P Note (Signed)
History and Physical Interval Note:  10/23/2018 6:57 AM  Mary Beltran  has presented today for surgery, with the diagnosis of C18.2 MALIGNANT NEOPLASM OF ASCENDING COLON.  The various methods of treatment have been discussed with the patient and family. After consideration of risks, benefits and other options for treatment, the patient has consented to  Procedure(s): LAPAROSCOPIC HAND ASSISTED RIGHT COLECTOMY (N/A) as a surgical intervention.  The patient's history has been reviewed, patient examined, no change in status, stable for surgery.  I have reviewed the patient's chart and labs.  Questions were answered to the patient's satisfaction.     Herbert Pun

## 2018-10-23 NOTE — Anesthesia Procedure Notes (Signed)
Procedure Name: Intubation Date/Time: 10/23/2018 7:39 AM Performed by: Philbert Riser, CRNA Pre-anesthesia Checklist: Patient identified, Emergency Drugs available, Suction available, Patient being monitored and Timeout performed Patient Re-evaluated:Patient Re-evaluated prior to induction Oxygen Delivery Method: Circle system utilized and Simple face mask Preoxygenation: Pre-oxygenation with 100% oxygen Induction Type: IV induction Ventilation: Mask ventilation without difficulty Laryngoscope Size: Mac and 3 Grade View: Grade I Tube type: Oral Tube size: 7.0 mm Number of attempts: 1 Airway Equipment and Method: Stylet Placement Confirmation: ETT inserted through vocal cords under direct vision,  positive ETCO2 and breath sounds checked- equal and bilateral Secured at: 20 cm Tube secured with: Tape Dental Injury: Teeth and Oropharynx as per pre-operative assessment

## 2018-10-23 NOTE — Transfer of Care (Signed)
Immediate Anesthesia Transfer of Care Note  Patient: Mary Beltran  Procedure(s) Performed: LAPAROSCOPIC HAND ASSISTED RIGHT COLECTOMY (N/A )  Patient Location: PACU  Anesthesia Type:General  Level of Consciousness: awake, alert  and oriented  Airway & Oxygen Therapy: Patient Spontanous Breathing and Patient connected to face mask oxygen  Post-op Assessment: Report given to RN and Post -op Vital signs reviewed and stable  Post vital signs: Reviewed and stable  Last Vitals:  Vitals Value Taken Time  BP 162/76 10/23/18 1038  Temp 36.4 C 10/23/18 1038  Pulse 93 10/23/18 1040  Resp 15 10/23/18 1040  SpO2 100 % 10/23/18 1040  Vitals shown include unvalidated device data.  Last Pain:  Vitals:   10/23/18 0620  TempSrc: (P) Tympanic  PainSc: (P) 0-No pain         Complications: No apparent anesthesia complications

## 2018-10-24 ENCOUNTER — Encounter: Payer: Self-pay | Admitting: General Surgery

## 2018-10-24 LAB — CBC
HCT: 25.1 % — ABNORMAL LOW (ref 36.0–46.0)
Hemoglobin: 7.6 g/dL — ABNORMAL LOW (ref 12.0–15.0)
MCH: 23.9 pg — ABNORMAL LOW (ref 26.0–34.0)
MCHC: 30.3 g/dL (ref 30.0–36.0)
MCV: 78.9 fL — ABNORMAL LOW (ref 80.0–100.0)
Platelets: 325 10*3/uL (ref 150–400)
RBC: 3.18 MIL/uL — ABNORMAL LOW (ref 3.87–5.11)
RDW: 17 % — ABNORMAL HIGH (ref 11.5–15.5)
WBC: 12.7 10*3/uL — ABNORMAL HIGH (ref 4.0–10.5)
nRBC: 0 % (ref 0.0–0.2)

## 2018-10-24 LAB — BASIC METABOLIC PANEL
Anion gap: 10 (ref 5–15)
BUN: 11 mg/dL (ref 8–23)
CO2: 22 mmol/L (ref 22–32)
Calcium: 8.3 mg/dL — ABNORMAL LOW (ref 8.9–10.3)
Chloride: 102 mmol/L (ref 98–111)
Creatinine, Ser: 0.72 mg/dL (ref 0.44–1.00)
GFR calc Af Amer: >60 mL/min (ref >60)
GFR calc non Af Amer: >60 mL/min (ref >60)
Glucose, Bld: 114 mg/dL — ABNORMAL HIGH (ref 70–99)
Potassium: 3.8 mmol/L (ref 3.5–5.1)
Sodium: 134 mmol/L — ABNORMAL LOW (ref 135–145)

## 2018-10-24 LAB — MAGNESIUM: Magnesium: 1.7 mg/dL (ref 1.7–2.4)

## 2018-10-24 MED ORDER — POLYETHYLENE GLYCOL 3350 17 G PO PACK: 17.0000 g | PACK | Freq: Every day | ORAL | Status: DC

## 2018-10-24 NOTE — Progress Notes (Signed)
[place] Hospital Day(s): 1.   Post op day(s): 1 Day Post-Op.   Interval History: Patient seen and examined, no acute events or new complaints overnight. Patient reports feeling well today.  She reports that she walked yesterday and this morning and that made her feel better.  She denies passing gas.  She reported the pain is controlled.  Vital signs in last 24 hours: [min-max] current  Temp:  [97.5 F (36.4 C)-98.1 F (36.7 C)] 98.1 F (36.7 C) (07/17 0508) Pulse Rate:  [72-94] 72 (07/17 0508) Resp:  [10-20] 18 (07/17 0508) BP: (91-162)/(57-79) 115/57 (07/17 0508) SpO2:  [96 %-100 %] 97 % (07/17 0508) Weight:  [59.9 kg] 59.9 kg (07/16 1456)     Height: 5' 7"  (170.2 cm) Weight: 59.9 kg BMI (Calculated): 20.67   Physical Exam:  Constitutional: alert, cooperative and no distress  Respiratory: breathing non-labored at rest  Cardiovascular: regular rate and sinus rhythm  Gastrointestinal: soft, non-tender, and non-distended.  Wound is dry and clean  Labs:  CBC Latest Ref Rng & Units 10/24/2018 10/23/2018 10/23/2018  WBC 4.0 - 10.5 K/uL 12.7(H) 18.1(H) 6.9  Hemoglobin 12.0 - 15.0 g/dL 7.6(L) 9.2(L) 8.5(L)  Hematocrit 36.0 - 46.0 % 25.1(L) 30.3(L) 27.2(L)  Platelets 150 - 400 K/uL 325 386 371   CMP Latest Ref Rng & Units 10/24/2018 10/13/2018 10/09/2018  Glucose 70 - 99 mg/dL 114(H) 106(H) -  BUN 8 - 23 mg/dL 11 9 -  Creatinine 0.44 - 1.00 mg/dL 0.72 0.67 0.60  Sodium 135 - 145 mmol/L 134(L) 135 -  Potassium 3.5 - 5.1 mmol/L 3.8 3.9 -  Chloride 98 - 111 mmol/L 102 102 -  CO2 22 - 32 mmol/L 22 21(L) -  Calcium 8.9 - 10.3 mg/dL 8.3(L) 8.9 -  Total Protein 6.5 - 8.1 g/dL - 7.4 -  Total Bilirubin 0.3 - 1.2 mg/dL - 0.5 -  Alkaline Phos 38 - 126 U/L - 78 -  AST 15 - 41 U/L - 18 -  ALT 0 - 44 U/L - 11 -    Imaging studies: No new pertinent imaging studies   Assessment/Plan:  A 78 year old female with ascending colon cancer status post laparoscopic hand-assisted  right colectomy postoperative day #1.  Patient recovering properly.  Pain is controlled.  There is no nausea and vomiting.  Patient has been ambulated.  Will discontinue Foley catheter today.  Patient encouraged to ambulate.  We will keep clear liquids until she start passing gas.  Expected drop in hemoglobin but no sign of active bleeding at this moment.  Patient with chronic.  At this moment asymptomatic without tachycardia.  Patient was ambulating without dizziness or shortness of breath.  No need for transfusion at this moment.  We will continue to follow trend.  Patient will continue with DVT prophylaxis.  Will wait for bowel function return.  Arnold Long, MD

## 2018-10-24 NOTE — Anesthesia Postprocedure Evaluation (Addendum)
Anesthesia Post Note  Patient: Mary Beltran  Procedure(s) Performed: LAPAROSCOPIC HAND ASSISTED RIGHT COLECTOMY (N/A )  Patient location during evaluation: PACU Anesthesia Type: General Level of consciousness: awake and alert Pain management: pain level controlled Vital Signs Assessment: post-procedure vital signs reviewed and stable Respiratory status: spontaneous breathing, nonlabored ventilation and respiratory function stable Cardiovascular status: blood pressure returned to baseline and stable Postop Assessment: no apparent nausea or vomiting Anesthetic complications: no     Last Vitals:  Vitals:   10/24/18 0508 10/24/18 1221  BP: (!) 115/57 (!) 125/50  Pulse: 72 67  Resp: 18 17  Temp: 36.7 C 36.4 C  SpO2: 97% 99%    Last Pain:  Vitals:   10/24/18 1340  TempSrc:   PainSc: 0-No pain                 Durenda Hurt

## 2018-10-24 NOTE — Progress Notes (Signed)
Foley removed at 10 am

## 2018-10-25 LAB — TYPE AND SCREEN
ABO/RH(D): O POS
Antibody Screen: NEGATIVE

## 2018-10-25 LAB — CBC
HCT: 29.8 % — ABNORMAL LOW (ref 36.0–46.0)
Hemoglobin: 8.9 g/dL — ABNORMAL LOW (ref 12.0–15.0)
MCH: 24 pg — ABNORMAL LOW (ref 26.0–34.0)
MCHC: 29.9 g/dL — ABNORMAL LOW (ref 30.0–36.0)
MCV: 80.3 fL (ref 80.0–100.0)
Platelets: 344 10*3/uL (ref 150–400)
RBC: 3.71 MIL/uL — ABNORMAL LOW (ref 3.87–5.11)
RDW: 18 % — ABNORMAL HIGH (ref 11.5–15.5)
WBC: 8.6 10*3/uL (ref 4.0–10.5)
nRBC: 0 % (ref 0.0–0.2)

## 2018-10-25 LAB — GLUCOSE, CAPILLARY: Glucose-Capillary: 123 mg/dL — ABNORMAL HIGH (ref 70–99)

## 2018-10-25 LAB — HEMOGLOBIN: Hemoglobin: 10.1 g/dL — ABNORMAL LOW (ref 12.0–15.0)

## 2018-10-25 NOTE — Progress Notes (Signed)
Spoke with dr. Windell Moment to make aware patient is having abdominal pain, vomited x1 this am. Also having loose watery bloody stools. Per md okay to give scheduled lovenox 23m sq. Will continue to monitor closely

## 2018-10-25 NOTE — Progress Notes (Signed)
[place] Hospital Day(s): 2.   Post op day(s): 2 Days Post-Op.   Interval History: Patient seen and examined, no acute events or new complaints overnight. Patient reports having an emesis this morning.  At the moment of my evaluation she was not nauseous.  She reports that she continue passing stool yesterday.  Last bowel movement was blood clot.  Vital signs in last 24 hours: [min-max] current  Temp:  [97.6 F (36.4 C)-98.3 F (36.8 C)] 98.2 F (36.8 C) (07/18 0455) Pulse Rate:  [65-72] 72 (07/18 0455) Resp:  [17-18] 17 (07/18 0455) BP: (125-140)/(50-59) 140/59 (07/18 0455) SpO2:  [96 %-99 %] 96 % (07/18 0455)     Height: 5' 7"  (170.2 cm) Weight: 59.9 kg BMI (Calculated): 20.67   Emesis: 200 mL this morning.  Physical Exam:  Constitutional: alert, cooperative and no distress  Respiratory: breathing non-labored at rest  Cardiovascular: regular rate and sinus rhythm  Gastrointestinal: soft, non-tender, and mild-distended.  Wound is dry and clean  Labs:  CBC Latest Ref Rng & Units 10/25/2018 10/24/2018 10/23/2018  WBC 4.0 - 10.5 K/uL 8.6 12.7(H) 18.1(H)  Hemoglobin 12.0 - 15.0 g/dL 8.9(L) 7.6(L) 9.2(L)  Hematocrit 36.0 - 46.0 % 29.8(L) 25.1(L) 30.3(L)  Platelets 150 - 400 K/uL 344 325 386   CMP Latest Ref Rng & Units 10/24/2018 10/13/2018 10/09/2018  Glucose 70 - 99 mg/dL 114(H) 106(H) -  BUN 8 - 23 mg/dL 11 9 -  Creatinine 0.44 - 1.00 mg/dL 0.72 0.67 0.60  Sodium 135 - 145 mmol/L 134(L) 135 -  Potassium 3.5 - 5.1 mmol/L 3.8 3.9 -  Chloride 98 - 111 mmol/L 102 102 -  CO2 22 - 32 mmol/L 22 21(L) -  Calcium 8.9 - 10.3 mg/dL 8.3(L) 8.9 -  Total Protein 6.5 - 8.1 g/dL - 7.4 -  Total Bilirubin 0.3 - 1.2 mg/dL - 0.5 -  Alkaline Phos 38 - 126 U/L - 78 -  AST 15 - 41 U/L - 18 -  ALT 0 - 44 U/L - 11 -    Imaging studies: No new pertinent imaging studies   Assessment/Plan:  A 78 year old female with ascending colon cancer status post laparoscopic hand-assisted  right colectomy postoperative day #1.  Patient was recovering slowly but today had one episode of vomiting.  At the moment of my evaluation she was not nauseous.  This could be brief episode of ileus versus any side effect of the medications.  We will continue monitoring.  She continue passing bowel movements.  She has some bloody stool which is expected after this type of surgery but the heme no globin increases to 8.9.  The white blood cell count decreases to 8.6 from 12.  There has been no tachycardia or fever.  We will continue with DVT prophylaxis.  Encourage patient to ambulate as tolerated.  Encouraged to continue incentive spirometer.  We will continue with full liquids but if patient feels nauseous will hold diet.  Arnold Long, MD

## 2018-10-25 NOTE — Progress Notes (Signed)
Called into room, cna at bedside stated she helped patient to toilet, after patient stood from [place] she went limp/ passed out patient did not hit the floor patient was placed back in the bed. Upon entering room patient is alert, remembers felling like she was about to pass out. Current bp 103/51, heart rate 80 sat 97 on ra.Damaris Schooner with dr. Peyton Najjar to make aware, patient is currently on ns @5o  ml/hr. Patient has been having multiple watery stool and urine, poor intake. Per md increase ns to 25m/hr. Will continue to monitor closely

## 2018-10-26 LAB — BASIC METABOLIC PANEL
Anion gap: 4 — ABNORMAL LOW (ref 5–15)
BUN: 11 mg/dL (ref 8–23)
CO2: 22 mmol/L (ref 22–32)
Calcium: 8 mg/dL — ABNORMAL LOW (ref 8.9–10.3)
Chloride: 110 mmol/L (ref 98–111)
Creatinine, Ser: 0.83 mg/dL (ref 0.44–1.00)
GFR calc Af Amer: >60 mL/min (ref >60)
GFR calc non Af Amer: >60 mL/min (ref >60)
Glucose, Bld: 106 mg/dL — ABNORMAL HIGH (ref 70–99)
Potassium: 3.9 mmol/L (ref 3.5–5.1)
Sodium: 136 mmol/L (ref 135–145)

## 2018-10-26 LAB — CBC
HCT: 28.1 % — ABNORMAL LOW (ref 36.0–46.0)
Hemoglobin: 8.3 g/dL — ABNORMAL LOW (ref 12.0–15.0)
MCH: 24.1 pg — ABNORMAL LOW (ref 26.0–34.0)
MCHC: 29.5 g/dL — ABNORMAL LOW (ref 30.0–36.0)
MCV: 81.4 fL (ref 80.0–100.0)
Platelets: 328 10*3/uL (ref 150–400)
RBC: 3.45 MIL/uL — ABNORMAL LOW (ref 3.87–5.11)
RDW: 18.6 % — ABNORMAL HIGH (ref 11.5–15.5)
WBC: 8.6 10*3/uL (ref 4.0–10.5)
nRBC: 0 % (ref 0.0–0.2)

## 2018-10-26 LAB — MAGNESIUM: Magnesium: 1.7 mg/dL (ref 1.7–2.4)

## 2018-10-26 MED ORDER — PANTOPRAZOLE SODIUM 40 MG PO TBEC
40.0000 mg | DELAYED_RELEASE_TABLET | Freq: Every day | ORAL | Status: DC
  Administered 2018-10-26: 40 mg via ORAL
  Filled 2018-10-26: qty 1

## 2018-10-26 MED ORDER — SODIUM CHLORIDE 0.9 % IV BOLUS
1000.0000 mL | Freq: Once | INTRAVENOUS | Status: AC
  Administered 2018-10-26: 1000 mL via INTRAVENOUS

## 2018-10-26 MED ORDER — ENOXAPARIN SODIUM 40 MG/0.4ML ~~LOC~~ SOLN
40.0000 mg | SUBCUTANEOUS | Status: DC
  Filled 2018-10-26: qty 0.4

## 2018-10-26 NOTE — Progress Notes (Signed)
[place] Hospital Day(s): 3.   Post op day(s): 3 Days Post-Op.   Interval History: Patient seen and examined, no acute events or new complaints overnight. Patient reports feeling better today. She reports that she had a better bowel movement today and does not feels as dizzy as yesterday. Patient denies nausea today.    Vital signs in last 24 hours: [min-max] current  Temp:  [97.5 F (36.4 C)-97.7 F (36.5 C)] 97.5 F (36.4 C) (07/19 0335) Pulse Rate:  [81-84] 82 (07/19 0335) Resp:  [18] 18 (07/19 0335) BP: (107-118)/(40-59) 118/54 (07/19 0335) SpO2:  [98 %] 98 % (07/19 0335)     Height: 5' 7"  (170.2 cm) Weight: 59.9 kg BMI (Calculated): 20.67    Physical Exam:  Constitutional: alert, cooperative and no distress  Respiratory: breathing non-labored at rest  Cardiovascular: regular rate and sinus rhythm  Gastrointestinal: soft, non-tender, and non-distended.  Labs:  CBC Latest Ref Rng & Units 10/26/2018 10/25/2018 10/25/2018  WBC 4.0 - 10.5 K/uL 8.6 - 8.6  Hemoglobin 12.0 - 15.0 g/dL 8.3(L) 10.1(L) 8.9(L)  Hematocrit 36.0 - 46.0 % 28.1(L) - 29.8(L)  Platelets 150 - 400 K/uL 328 - 344   CMP Latest Ref Rng & Units 10/26/2018 10/24/2018 10/13/2018  Glucose 70 - 99 mg/dL 106(H) 114(H) 106(H)  BUN 8 - 23 mg/dL 11 11 9   Creatinine 0.44 - 1.00 mg/dL 0.83 0.72 0.67  Sodium 135 - 145 mmol/L 136 134(L) 135  Potassium 3.5 - 5.1 mmol/L 3.9 3.8 3.9  Chloride 98 - 111 mmol/L 110 102 102  CO2 22 - 32 mmol/L 22 22 21(L)  Calcium 8.9 - 10.3 mg/dL 8.0(L) 8.3(L) 8.9  Total Protein 6.5 - 8.1 g/dL - - 7.4  Total Bilirubin 0.3 - 1.2 mg/dL - - 0.5  Alkaline Phos 38 - 126 U/L - - 78  AST 15 - 41 U/L - - 18  ALT 0 - 44 U/L - - 11    Imaging studies: No new pertinent imaging studies   Assessment/Plan:  A 78 year old female with ascending colon cancer status post laparoscopic hand-assisted right colectomy postoperative day #3.  Patient recovering slowly. Feeling better today.  No nausea today. Stable hemoglobin. No fever or tachycardia. Will continue with full liquid today since she did not eat yesterday and assess for toleration. Continue with DVT prophylaxis. Continue pain management. Encourage to ambulate.   Arnold Long, MD

## 2018-10-26 NOTE — Progress Notes (Signed)
PHARMACIST - PHYSICIAN COMMUNICATION  CONCERNING: IV to Oral Route Change Policy  RECOMMENDATION: This patient is receiving pantoprazole by the intravenous route.  Based on criteria approved by the Pharmacy and Therapeutics Committee, the intravenous medication(s) is/are being converted to the equivalent oral dose form(s).   DESCRIPTION: These criteria include:  The patient is eating (either orally or via tube) and/or has been taking other orally administered medications for a least 24 hours  The patient has no evidence of active gastrointestinal bleeding or impaired GI absorption (gastrectomy, short bowel, patient on TNA or NPO).  If you have questions about this conversion, please contact the Pharmacy Department   []   [phone] )  [place], [facility] 10/26/2018 3:10 PM

## 2018-10-27 MED ORDER — GABAPENTIN 300 MG PO CAPS: 300.0000 mg | ORAL_CAPSULE | Freq: Two times a day (BID) | ORAL | 0 refills | Status: DC

## 2018-10-27 MED ORDER — NABUMETONE 500 MG PO TABS: 500.0000 mg | ORAL_TABLET | Freq: Two times a day (BID) | ORAL | 0 refills | Status: AC

## 2018-10-27 NOTE — Discharge Summary (Signed)
  Patient ID: Mary Beltran MRN: [id number] DOB/AGE: 76/27/1942 76 y.o.  Admit date: 10/23/2018 Discharge date: 10/27/2018   Discharge Diagnoses:  Active Problems:   Colon cancer [facility])   Procedures: Laparoscopic hand-assisted right colectomy  Hospital Course: Patient admitted with ascending colon cancer.  She underwent laparoscopic hand-assisted right colectomy.  She tolerated procedure well.  She has been recovering slowly but appropriately.  Today pain is controlled.  Patient continue having bowel movement.  Patient has soft diet and tolerated.  Patient urinated by herself.  Physical Exam  Constitutional: She is oriented to person, place, and time and well-developed, well-nourished, and in no distress.  Neck: Normal range of motion.  Cardiovascular: Normal rate and regular rhythm.  Pulmonary/Chest: Effort normal.  Abdominal: Soft.  Neurological: She is alert and oriented to person, place, and time.  Wound is dry and clean   Consults: None  Disposition: Discharge disposition: 01-Home or Self Care       Allergies as of 10/27/2018      Reactions   Pain & Fever [acetaminophen]    Patient states all pain medications make her "vomit"   Sulfa Antibiotics Nausea And Vomiting   Codeine    Penicillins Other (See Comments)   Did it involve swelling of the face/tongue/throat, SOB, or low BP? Yes Did it involve sudden or severe rash/hives, skin peeling, or any reaction on the inside of your mouth or nose? Yes Did you need to seek medical attention at a hospital or doctor's office? Yes When did it last happen?in her 78s If all above answers are "NO", may proceed with cephalosporin use.      Medication List    TAKE these medications   gabapentin 300 MG capsule Commonly known as: NEURONTIN Take 1 capsule (300 mg total) by mouth 2 (two) times daily.   nabumetone 500 MG tablet Commonly known as: RELAFEN Take 1 tablet (500 mg total) by mouth 2 (two) times daily for 15  days.      Follow-up Information    Herbert Pun, MD Follow up in 2 week(s).   Specialty: General Surgery Contact information: [address] [place] [address] [phone]

## 2018-10-27 NOTE — Progress Notes (Signed)
Patient discharged home with sister. Discharge instructions given, patient did not report any questions. Lupita Leash

## 2018-10-27 NOTE — Care Management Important Message (Signed)
Important Message  Patient Details  Name: Mary Beltran MRN: [id number] Date of Birth: [birth year]   Medicare Important Message Given:  Yes     Dannette Barbara 10/27/2018, 11:33 AM

## 2018-10-27 NOTE — Discharge Instructions (Signed)
°  Diet: Resume soft low fiber diet for 2 weeks. After two weeks, start regular high fiber diet. It is preferred to have frequent small meals than having 3 big meals a day. Drink at least 8 glasses of water daily.   Activity: No heavy lifting >10 pounds (children, pets, laundry, garbage) or strenuous activity for the next 6 weeks, but light activity and walking are encouraged.   Do not drive for four weeks.  It is normal to feel tire most of the time since your body is using energy to heal.    Wound care: May shower with soapy water and pat dry (do not rub incisions), but no baths or submerging incision underwater until follow-up. (no swimming)   Do not smoke, since smoking delays the process of healing, among other negative effects.   Call the office if the wound becomes red and start to drain pus.   Medications: Resume all home medications. For mild to moderate pain: acetaminophen (Tylenol) or ibuprofen (if no kidney disease). Combining Tylenol with alcohol can substantially increase your risk of causing liver disease. Narcotic pain medications, if prescribed, can be used for severe pain, though may cause nausea, constipation, and drowsiness.   Call office [phone]) at any time if any questions, worsening pain, fevers/chills, bleeding, drainage from incision site, or other concerns.

## 2018-10-29 ENCOUNTER — Other Ambulatory Visit: Payer: Self-pay

## 2018-10-30 ENCOUNTER — Inpatient Hospital Stay: Payer: Medicare HMO

## 2018-10-30 ENCOUNTER — Other Ambulatory Visit: Payer: Self-pay

## 2018-10-30 ENCOUNTER — Inpatient Hospital Stay (HOSPITAL_BASED_OUTPATIENT_CLINIC_OR_DEPARTMENT_OTHER): Payer: Medicare HMO | Admitting: Oncology

## 2018-10-30 VITALS — BP 152/74 | HR 87 | Temp 97.6°F | Wt 139.7 lb

## 2018-10-30 DIAGNOSIS — Z803 Family history of malignant neoplasm of breast: Secondary | ICD-10-CM

## 2018-10-30 DIAGNOSIS — R918 Other nonspecific abnormal finding of lung field: Secondary | ICD-10-CM

## 2018-10-30 DIAGNOSIS — R69 Illness, unspecified: Secondary | ICD-10-CM | POA: Diagnosis not present

## 2018-10-30 DIAGNOSIS — I700 Atherosclerosis of aorta: Secondary | ICD-10-CM | POA: Diagnosis not present

## 2018-10-30 DIAGNOSIS — D500 Iron deficiency anemia secondary to blood loss (chronic): Secondary | ICD-10-CM

## 2018-10-30 DIAGNOSIS — R634 Abnormal weight loss: Secondary | ICD-10-CM

## 2018-10-30 DIAGNOSIS — R590 Localized enlarged lymph nodes: Secondary | ICD-10-CM | POA: Diagnosis not present

## 2018-10-30 DIAGNOSIS — R109 Unspecified abdominal pain: Secondary | ICD-10-CM

## 2018-10-30 DIAGNOSIS — Z86010 Personal history of colonic polyps: Secondary | ICD-10-CM | POA: Diagnosis not present

## 2018-10-30 DIAGNOSIS — Z720 Tobacco use: Secondary | ICD-10-CM

## 2018-10-30 DIAGNOSIS — Z9049 Acquired absence of other specified parts of digestive tract: Secondary | ICD-10-CM

## 2018-10-30 DIAGNOSIS — J439 Emphysema, unspecified: Secondary | ICD-10-CM | POA: Diagnosis not present

## 2018-10-30 DIAGNOSIS — F17210 Nicotine dependence, cigarettes, uncomplicated: Secondary | ICD-10-CM

## 2018-10-30 DIAGNOSIS — D509 Iron deficiency anemia, unspecified: Secondary | ICD-10-CM | POA: Diagnosis not present

## 2018-10-30 DIAGNOSIS — C182 Malignant neoplasm of ascending colon: Secondary | ICD-10-CM | POA: Diagnosis not present

## 2018-10-30 DIAGNOSIS — R5383 Other fatigue: Secondary | ICD-10-CM | POA: Diagnosis not present

## 2018-10-30 DIAGNOSIS — Z809 Family history of malignant neoplasm, unspecified: Secondary | ICD-10-CM

## 2018-10-30 DIAGNOSIS — K5900 Constipation, unspecified: Secondary | ICD-10-CM

## 2018-10-30 LAB — RETIC PANEL
Immature Retic Fract: 23.7 % — ABNORMAL HIGH (ref 2.3–15.9)
RBC.: 3.27 MIL/uL — ABNORMAL LOW (ref 3.87–5.11)
Retic Count, Absolute: 83.4 10*3/uL (ref 19.0–186.0)
Retic Ct Pct: 2.6 % (ref 0.4–3.1)
Reticulocyte Hemoglobin: 27.6 pg — ABNORMAL LOW (ref >27.9)

## 2018-10-30 LAB — HEMOGLOBIN AND HEMATOCRIT, BLOOD
HCT: 26.2 % — ABNORMAL LOW (ref 36.0–46.0)
Hemoglobin: 8 g/dL — ABNORMAL LOW (ref 12.0–15.0)

## 2018-10-30 MED ORDER — SODIUM CHLORIDE 0.9 % IV SOLN
Freq: Once | INTRAVENOUS | Status: AC
  Administered 2018-10-30: 12:00:00 via INTRAVENOUS
  Filled 2018-10-30: qty 250

## 2018-10-30 MED ORDER — IRON SUCROSE 20 MG/ML IV SOLN
200.0000 mg | Freq: Once | INTRAVENOUS | Status: AC
  Administered 2018-10-30: 200 mg via INTRAVENOUS
  Filled 2018-10-30: qty 10

## 2018-10-31 ENCOUNTER — Encounter: Payer: Self-pay | Admitting: Oncology

## 2018-10-31 NOTE — Progress Notes (Signed)
Hematology/Oncology  [facility] Telephone:([phone] [phone]   Patient Care Team: [name], MD as PCP - General (Internal Medicine) Clent Jacks, RN as Oncology Nurse Navigator  REFERRING PROVIDER: [name], MD  CHIEF COMPLAINTS/REASON FOR VISIT:  Follow-up for management of stage III colon cancer,  iron deficiency, lung nodules HISTORY OF PRESENTING ILLNESS:   Mary Beltran is a  78 y.o.  female with PMH listed below was seen in consultation at the request of  [name], MD  for evaluation of colon cancer Patient had acute cholecystitis and had laparoscopic cholecystectomy same day by Dr. Peyton Najjar. Patient was found to have iron deficiency anemia with hemoglobin around 9.2, ferritin 7 and iron 24. Baseline hemoglobin 1 year ago was 13.1.  Patient was started on iron supplementation with Ferrex 150 mg iron capsule daily. Reports to have chronic constipation.  Denies any blood in the stool. Patient was seen by gastroenterology and had endoscopy and colonoscopy done on 10/06/2018. There was a partially obstructing mass in the cecum which was biopsied. 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 Gastric mucosa shows reactive foveolar hyperplasia, stromal fibrosis and focal chronic inflammation.  Negative for H. pylori.  Duodenal biopsy shows no abnormalities.  No evidence of celiac disease.  Sigmoid polyps showed tubular adenoma. 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 10 pound weight loss within past few months. Denies any family history colon cancer.  Paternal grandmother passed away due to breast cancer. Per patient's request, I called patient's son [name] and sister [name], and put both of them on speaker phone so they can hear the conversation and participate in discussion.  INTERVAL HISTORY Mary Beltran is a 78 y.o. female who has above history reviewed by  me today presents for follow up visit for management of stage III right colon cancer, iron deficiency, lung nodules Iron deficiency anemia, patient received 2 IV Venofer treatment prior to her colon cancer surgery. 10/23/2018, she underwent right hemicolectomy Pathology showed invasive moderately differentiated adenocarcinoma involving cecum and adjacent terminal ileum with extension into pericecal adipose tissue and focally to inked serosal surface. Invasive moderately differentiated adenocarcinoma involving proximal ascending colon and ileocecal valve with extension into pericolonic adipose tissue. Tubular adenoma, sessile serrated polyp in the cecum, tubular adenoma sessile serrated polyp in the ascending colon.  Grade 2, tumor invades visceral peritoneum, all margins are negative. Pathological staging pT4a pN1c  Today patient presents to discuss results and management plan. She reports feeling well.  Fatigue has slightly improved compared to how how she felt during first visit with [place]. Still tired and fatigued. Appetite is good.  She had no difficulty with bowel movement.  Denies any fever, chills, abdominal pain Denies any new complaints today.   Review of Systems  Constitutional: Positive for fatigue. Negative for appetite change, chills and fever.  HENT:   Negative for hearing loss and voice change.   Eyes: Negative for eye problems.  Respiratory: Negative for chest tightness and cough.   Cardiovascular: Negative for chest pain.  Gastrointestinal: Negative for abdominal distention, abdominal pain, blood in stool and constipation.  Endocrine: Negative for hot flashes.  Genitourinary: Negative for difficulty urinating and frequency.   Musculoskeletal: Negative for arthralgias.  Skin: Negative for itching and rash.  Neurological: Negative for extremity weakness.  Hematological: Negative for adenopathy.  Psychiatric/Behavioral: Negative for confusion.    MEDICAL HISTORY:  Past  Medical History:  Diagnosis Date  . Complication of anesthesia  NOT WITH GB  . GERD (gastroesophageal reflux disease)   . IDA (iron deficiency anemia) 10/13/2018  . PONV (postoperative nausea and vomiting)     SURGICAL HISTORY: Past Surgical History:  Procedure Laterality Date  . ABDOMINAL HYSTERECTOMY     complete  . APPENDECTOMY    . CHOLECYSTECTOMY N/A 08/22/2018   Procedure: LAPAROSCOPIC CHOLECYSTECTOMY;  Surgeon: Herbert Pun, MD;  Location: [facility] ORS;  Service: General;  Laterality: N/A;  . COLONOSCOPY    . LAPAROSCOPIC RIGHT COLECTOMY N/A 10/23/2018   Procedure: LAPAROSCOPIC HAND ASSISTED RIGHT COLECTOMY;  Surgeon: Herbert Pun, MD;  Location: [facility] ORS;  Service: General;  Laterality: N/A;  . PERCUTANEOUS PINNING WRIST FRACTURE Right   . WRIST FOREIGN BODY REMOVAL Right    2016    SOCIAL HISTORY: Social History   Socioeconomic History  . Marital status: Divorced    Spouse name: Not on file  . Number of children: Not on file  . Years of education: Not on file  . Highest education level: Not on file  Occupational History  . Occupation: retired  Scientific laboratory technician  . Financial resource strain: Not on file  . Food insecurity    Worry: Not on file    Inability: Not on file  . Transportation needs    Medical: Not on file    Non-medical: Not on file  Tobacco Use  . Smoking status: Current Every Day Smoker    Packs/day: 0.25    Years: 60.00    Pack years: 15.00  . Smokeless tobacco: Never Used  Substance and Sexual Activity  . Alcohol use: No  . Drug use: Not Currently  . Sexual activity: Not on file  Lifestyle  . Physical activity    Days per week: Not on file    Minutes per session: Not on file  . Stress: Not on file  Relationships  . Social Herbalist on phone: Not on file    Gets together: Not on file    Attends religious service: Not on file    Active member of club or organization: Not on file    Attends meetings of clubs or  organizations: Not on file    Relationship status: Not on file  . Intimate partner violence    Fear of current or ex partner: Not on file    Emotionally abused: Not on file    Physically abused: Not on file    Forced sexual activity: Not on file  Other Topics Concern  . Not on file  Social History Narrative  . Not on file    FAMILY HISTORY: Family History  Problem Relation Age of Onset  . COPD Mother   . Suicidality Father   . Breast cancer Paternal Grandmother   . Failure to thrive Sister     ALLERGIES:  is allergic to pain & fever [acetaminophen]; sulfa antibiotics; codeine; and penicillins.  MEDICATIONS:  Current Outpatient Medications  Medication Sig Dispense Refill  . gabapentin (NEURONTIN) 300 MG capsule Take 1 capsule (300 mg total) by mouth 2 (two) times daily. 30 capsule 0  . nabumetone (RELAFEN) 500 MG tablet Take 1 tablet (500 mg total) by mouth 2 (two) times daily for 15 days. 30 tablet 0   No current facility-administered medications for this visit.      PHYSICAL EXAMINATION: ECOG PERFORMANCE STATUS: 1 - Symptomatic but completely ambulatory Vitals:   10/30/18 1053  BP: (!) 152/74  Pulse: 87  Temp: 97.6 F (36.4 C)  Filed Weights   10/30/18 1053  Weight: 139 lb 11.2 oz (63.4 kg)    Physical Exam Constitutional:      General: She is not in acute distress.    Comments: Thin, walk in   HENT:     Head: Normocephalic and atraumatic.  Eyes:     General: No scleral icterus.    Pupils: Pupils are equal, round, and reactive to light.  Neck:     Musculoskeletal: Normal range of motion and neck supple.  Cardiovascular:     Rate and Rhythm: Normal rate and regular rhythm.     Heart sounds: Normal heart sounds.  Pulmonary:     Effort: Pulmonary effort is normal. No respiratory distress.     Breath sounds: No wheezing.  Abdominal:     General: Bowel sounds are normal. There is no distension.     Palpations: Abdomen is soft. There is no mass.      Tenderness: There is no abdominal tenderness.  Musculoskeletal: Normal range of motion.        General: No deformity.  Skin:    General: Skin is warm and dry.     Coloration: Skin is pale.     Findings: No erythema or rash.  Neurological:     Mental Status: She is alert and oriented to person, place, and time.     Cranial Nerves: No cranial nerve deficit.     Coordination: Coordination normal.  Psychiatric:        Behavior: Behavior normal.        Thought Content: Thought content normal.     LABORATORY DATA:  I have reviewed the data as listed Lab Results  Component Value Date   WBC 8.6 10/26/2018   HGB 8.0 (L) 10/30/2018   HCT 26.2 (L) 10/30/2018   MCV 81.4 10/26/2018   PLT 328 10/26/2018   Recent Labs    10/13/18 1154 10/24/18 0248 10/26/18 0504  NA 135 134* 136  K 3.9 3.8 3.9  CL 102 102 110  CO2 21* 22 22  GLUCOSE 106* 114* 106*  BUN 9 11 11   CREATININE 0.67 0.72 0.83  CALCIUM 8.9 8.3* 8.0*  GFRNONAA >60 >60 >60  GFRAA >60 >60 >60  PROT 7.4  --   --   ALBUMIN 4.0  --   --   AST 18  --   --   ALT 11  --   --   ALKPHOS 78  --   --   BILITOT 0.5  --   --    Iron/TIBC/Ferritin/ %Sat    Component Value Date/Time   IRON 20 (L) 10/13/2018 1154   TIBC 547 (H) 10/13/2018 1154   FERRITIN 6 (L) 10/13/2018 1154   IRONPCTSAT 4 (L) 10/13/2018 1154      RADIOGRAPHIC STUDIES: I have personally reviewed the radiological images as listed and agreed with the findings in the report.  Ct Chest W Contrast  Result Date: 10/09/2018 CLINICAL DATA:  Possible colon mass seen on colonoscopy 4 days ago. EXAM: CT CHEST, ABDOMEN, AND PELVIS WITH CONTRAST TECHNIQUE: Multidetector CT imaging of the chest, abdomen and pelvis was performed following the standard protocol during bolus administration of intravenous contrast. CONTRAST:  160m OMNIPAQUE IOHEXOL 300 MG/ML  SOLN COMPARISON:  04/23/2015 FINDINGS: CT CHEST FINDINGS Cardiovascular: The heart size is normal. No substantial  pericardial effusion. Coronary artery calcification is evident. Atherosclerotic calcification is noted in the wall of the thoracic aorta. Mediastinum/Nodes: 11 mm short axis precarinal lymph node  is stable. Other upper normal mediastinal and hilar lymphadenopathy is similar to prior. The esophagus has normal imaging features. There is no axillary lymphadenopathy. Lungs/Pleura: The central tracheobronchial airways are patent. Centrilobular emphsyema noted. Calcified granuloma noted posterior right upper lobe. Noncalcified 2 mm right upper lobe nodule visible on 57/5, new since prior study. 2 mm right upper lobe nodule also visible on 46/5. 7 mm sub solid left upper lobe nodule (54/5) was 6 mm previously. Musculoskeletal: No worrisome lytic or sclerotic osseous abnormality. CT ABDOMEN PELVIS FINDINGS Hepatobiliary: No suspicious focal abnormality within the liver parenchyma. Gallbladder is surgically absent. No intrahepatic or extrahepatic biliary dilation. Pancreas: No focal mass lesion. No dilatation of the main duct. No intraparenchymal cyst. No peripancreatic edema. Spleen: No splenomegaly. No focal mass lesion. Adrenals/Urinary Tract: Left adrenal thickening evident without discrete nodule or mass. Right adrenal gland unremarkable 5 mm low-density lesion interpolar right kidney is too small to characterize. Similar 3 mm low-density lesion identified lower pole right kidney. Left kidney unremarkable. No evidence for hydroureter. The urinary bladder appears normal for the degree of distention. Stomach/Bowel: Stomach is unremarkable. Areas of apparent wall thickening in the gastric cardia and body of stomach are indeterminate. No evidence of outlet obstruction. Duodenum is normally positioned as is the ligament of Treitz. No small bowel wall thickening. No small bowel dilatation. Mild wall thickening noted in the terminal ileum and ileocecal valve. The appendix is not visualized, but there is no edema or  inflammation in the region of the cecum. Circumferential wall thickening noted in the cecum at the level of the ileocecal valve. Colonic wall is ill-defined in there is pericolonic soft tissue stranding in this region. Left colon unremarkable. Vascular/Lymphatic: There is abdominal aortic atherosclerosis without aneurysm. There is no gastrohepatic or hepatoduodenal ligament lymphadenopathy. Small lymph nodes are noted in the ileocolic mesentery measuring up to 6 mm short axis (image 89/series 3. No retroperitoneal lymphadenopathy. No pelvic sidewall lymphadenopathy. Reproductive: The uterus is surgically absent. There is no adnexal mass. Other: No intraperitoneal free fluid. Musculoskeletal: No worrisome lytic or sclerotic osseous abnormality. IMPRESSION: 1. Circumferential irregular wall thickening in the cecum, involving the ileocecal valve with some wall thickening in the terminal ileum as well. There is soft tissue stranding around the abnormal cecal wall raising the question of transmural extension of tumor. Small lymph nodes are noted in the adjacent ileocolic mesentery a metastatic disease cannot be excluded. 2. No evidence for hepatic metastases. 3. Tiny pulmonary nodules, some of which are new since 04/13/2015. Attention on follow-up recommended to exclude metastatic involvement. 4.  Aortic Atherosclerois (ICD10-170.0) 5.  Emphysema. ([id number]) Electronically Signed   By: Misty Stanley M.D.   On: 10/09/2018 12:59   Ct Abdomen Pelvis W Contrast  Result Date: 10/09/2018 CLINICAL DATA:  Possible colon mass seen on colonoscopy 4 days ago. EXAM: CT CHEST, ABDOMEN, AND PELVIS WITH CONTRAST TECHNIQUE: Multidetector CT imaging of the chest, abdomen and pelvis was performed following the standard protocol during bolus administration of intravenous contrast. CONTRAST:  152m OMNIPAQUE IOHEXOL 300 MG/ML  SOLN COMPARISON:  04/23/2015 FINDINGS: CT CHEST FINDINGS Cardiovascular: The heart size is normal. No  substantial pericardial effusion. Coronary artery calcification is evident. Atherosclerotic calcification is noted in the wall of the thoracic aorta. Mediastinum/Nodes: 11 mm short axis precarinal lymph node is stable. Other upper normal mediastinal and hilar lymphadenopathy is similar to prior. The esophagus has normal imaging features. There is no axillary lymphadenopathy. Lungs/Pleura: The central tracheobronchial airways are patent. Centrilobular emphsyema noted.  Calcified granuloma noted posterior right upper lobe. Noncalcified 2 mm right upper lobe nodule visible on 57/5, new since prior study. 2 mm right upper lobe nodule also visible on 46/5. 7 mm sub solid left upper lobe nodule (54/5) was 6 mm previously. Musculoskeletal: No worrisome lytic or sclerotic osseous abnormality. CT ABDOMEN PELVIS FINDINGS Hepatobiliary: No suspicious focal abnormality within the liver parenchyma. Gallbladder is surgically absent. No intrahepatic or extrahepatic biliary dilation. Pancreas: No focal mass lesion. No dilatation of the main duct. No intraparenchymal cyst. No peripancreatic edema. Spleen: No splenomegaly. No focal mass lesion. Adrenals/Urinary Tract: Left adrenal thickening evident without discrete nodule or mass. Right adrenal gland unremarkable 5 mm low-density lesion interpolar right kidney is too small to characterize. Similar 3 mm low-density lesion identified lower pole right kidney. Left kidney unremarkable. No evidence for hydroureter. The urinary bladder appears normal for the degree of distention. Stomach/Bowel: Stomach is unremarkable. Areas of apparent wall thickening in the gastric cardia and body of stomach are indeterminate. No evidence of outlet obstruction. Duodenum is normally positioned as is the ligament of Treitz. No small bowel wall thickening. No small bowel dilatation. Mild wall thickening noted in the terminal ileum and ileocecal valve. The appendix is not visualized, but there is no edema or  inflammation in the region of the cecum. Circumferential wall thickening noted in the cecum at the level of the ileocecal valve. Colonic wall is ill-defined in there is pericolonic soft tissue stranding in this region. Left colon unremarkable. Vascular/Lymphatic: There is abdominal aortic atherosclerosis without aneurysm. There is no gastrohepatic or hepatoduodenal ligament lymphadenopathy. Small lymph nodes are noted in the ileocolic mesentery measuring up to 6 mm short axis (image 89/series 3. No retroperitoneal lymphadenopathy. No pelvic sidewall lymphadenopathy. Reproductive: The uterus is surgically absent. There is no adnexal mass. Other: No intraperitoneal free fluid. Musculoskeletal: No worrisome lytic or sclerotic osseous abnormality. IMPRESSION: 1. Circumferential irregular wall thickening in the cecum, involving the ileocecal valve with some wall thickening in the terminal ileum as well. There is soft tissue stranding around the abnormal cecal wall raising the question of transmural extension of tumor. Small lymph nodes are noted in the adjacent ileocolic mesentery a metastatic disease cannot be excluded. 2. No evidence for hepatic metastases. 3. Tiny pulmonary nodules, some of which are new since 04/13/2015. Attention on follow-up recommended to exclude metastatic involvement. 4.  Aortic Atherosclerois (ICD10-170.0) 5.  Emphysema. ([id number]) Electronically Signed   By: Misty Stanley M.D.   On: 10/09/2018 12:59      ASSESSMENT & PLAN:  1. Cancer of right colon ([place])   2. Iron deficiency anemia due to chronic blood loss   3. Tobacco abuse   4. Lung nodules   5. Family history of cancer   Cancer Staging Cancer of right colon [facility]) Staging form: Colon and Rectum, AJCC 8th Edition - Pathologic stage from 10/31/2018: Stage IIIB (pT4a, pN1c, cM0) - Signed by [name], MD on 10/31/2018   #Stage IIIB right colon adenocarcinoma Pathology was reviewed and discussed with patient.  Per patient's  request, her Sister [name] was also called and she is able to overheard the conversation and participate in discussion and asking questions. The diagnosis of stage IIIb colon cancer and care plan were discussed with patient in details. NCCN guidelines were reviewed and shared with patient.   I recommend adjuvant chemotherapy with 5-FU for 6 months.  Goal of care was discussed. Curative intent, goal of adjuvant chemotherapy is to reduce local and systemic recurrence,  prolong her chance to survive.  Side effects of adjuvant chemotherapy was discussed. Patient has pT4 disease, she will also benefit from adjuvant radiation. Patient adamantly declines either chemotherapy or radiation treatments. She informs me that she values every day life quality more than life expectancy. She is not interested in proceeding with any treatment at this point. Discussed about surveillance plan which will be history and physical/labs every 3 months, CT scan every 6 months for the first 2 to 3 years, then annually images to 5 years  #Iron deficiency anemia, she received 2 IV iron treatments and tolerates well. Labs are reviewed and discussed with patient. Her hemoglobin is still decreased at 8, reticulocyte hemoglobin level decreased to 23.7 consistent with iron deficiency anemia. Recommend patient to proceed with IV Venofer weekly x4.  Patient agrees with treatment.  #Small lung nodules, attention on follow-up scans. #Family history of breast cancer, personal history of colon cancer.  Recommend genetic testing.  Patient wants to rediscuss in the future. . We spent sufficient time to discuss many aspect of care, questions were answered to patient's satisfaction.   Orders Placed This Encounter  Procedures  . CBC with Differential/Platelet    Standing Status:   Future    Standing Expiration Date:   10/31/2019  . Comprehensive metabolic panel    Standing Status:   Future    Standing Expiration Date:   10/31/2019  .  CEA    Standing Status:   Future    Standing Expiration Date:   10/31/2019    We spent sufficient time to discuss many aspect of care, questions were answered to patient's satisfaction.   cc [name], MD  Dr. Peyton Najjar. Follow-up in 3 months.  [name], MD, PhD Hematology Oncology [place] [facility] 10/31/2018

## 2018-11-03 LAB — SURGICAL PATHOLOGY

## 2018-11-06 ENCOUNTER — Other Ambulatory Visit: Payer: Self-pay

## 2018-11-06 ENCOUNTER — Inpatient Hospital Stay: Payer: Medicare HMO

## 2018-11-06 VITALS — BP 114/68 | HR 84 | Resp 18

## 2018-11-06 DIAGNOSIS — I700 Atherosclerosis of aorta: Secondary | ICD-10-CM | POA: Diagnosis not present

## 2018-11-06 DIAGNOSIS — D509 Iron deficiency anemia, unspecified: Secondary | ICD-10-CM | POA: Diagnosis not present

## 2018-11-06 DIAGNOSIS — R69 Illness, unspecified: Secondary | ICD-10-CM | POA: Diagnosis not present

## 2018-11-06 DIAGNOSIS — R634 Abnormal weight loss: Secondary | ICD-10-CM | POA: Diagnosis not present

## 2018-11-06 DIAGNOSIS — C182 Malignant neoplasm of ascending colon: Secondary | ICD-10-CM | POA: Diagnosis not present

## 2018-11-06 DIAGNOSIS — D500 Iron deficiency anemia secondary to blood loss (chronic): Secondary | ICD-10-CM

## 2018-11-06 DIAGNOSIS — R109 Unspecified abdominal pain: Secondary | ICD-10-CM | POA: Diagnosis not present

## 2018-11-06 DIAGNOSIS — J439 Emphysema, unspecified: Secondary | ICD-10-CM | POA: Diagnosis not present

## 2018-11-06 DIAGNOSIS — R590 Localized enlarged lymph nodes: Secondary | ICD-10-CM | POA: Diagnosis not present

## 2018-11-06 DIAGNOSIS — K5900 Constipation, unspecified: Secondary | ICD-10-CM | POA: Diagnosis not present

## 2018-11-06 DIAGNOSIS — R5383 Other fatigue: Secondary | ICD-10-CM | POA: Diagnosis not present

## 2018-11-06 MED ORDER — SODIUM CHLORIDE 0.9 % IV SOLN
Freq: Once | INTRAVENOUS | Status: AC
  Administered 2018-11-06: 13:00:00 via INTRAVENOUS
  Filled 2018-11-06: qty 250

## 2018-11-06 MED ORDER — IRON SUCROSE 20 MG/ML IV SOLN
200.0000 mg | Freq: Once | INTRAVENOUS | Status: AC
  Administered 2018-11-06: 200 mg via INTRAVENOUS
  Filled 2018-11-06: qty 10

## 2018-11-07 ENCOUNTER — Encounter: Payer: Self-pay | Admitting: Oncology

## 2018-11-07 ENCOUNTER — Other Ambulatory Visit: Payer: Self-pay

## 2018-11-07 ENCOUNTER — Inpatient Hospital Stay (HOSPITAL_BASED_OUTPATIENT_CLINIC_OR_DEPARTMENT_OTHER): Payer: Medicare HMO | Admitting: Oncology

## 2018-11-07 DIAGNOSIS — J439 Emphysema, unspecified: Secondary | ICD-10-CM | POA: Diagnosis not present

## 2018-11-07 DIAGNOSIS — D500 Iron deficiency anemia secondary to blood loss (chronic): Secondary | ICD-10-CM

## 2018-11-07 DIAGNOSIS — Z7189 Other specified counseling: Secondary | ICD-10-CM

## 2018-11-07 DIAGNOSIS — R634 Abnormal weight loss: Secondary | ICD-10-CM

## 2018-11-07 DIAGNOSIS — R5383 Other fatigue: Secondary | ICD-10-CM | POA: Diagnosis not present

## 2018-11-07 DIAGNOSIS — Z9049 Acquired absence of other specified parts of digestive tract: Secondary | ICD-10-CM

## 2018-11-07 DIAGNOSIS — R109 Unspecified abdominal pain: Secondary | ICD-10-CM | POA: Diagnosis not present

## 2018-11-07 DIAGNOSIS — Z803 Family history of malignant neoplasm of breast: Secondary | ICD-10-CM

## 2018-11-07 DIAGNOSIS — D509 Iron deficiency anemia, unspecified: Secondary | ICD-10-CM | POA: Diagnosis not present

## 2018-11-07 DIAGNOSIS — C182 Malignant neoplasm of ascending colon: Secondary | ICD-10-CM | POA: Diagnosis not present

## 2018-11-07 DIAGNOSIS — R590 Localized enlarged lymph nodes: Secondary | ICD-10-CM

## 2018-11-07 DIAGNOSIS — Z86010 Personal history of colonic polyps: Secondary | ICD-10-CM

## 2018-11-07 DIAGNOSIS — R69 Illness, unspecified: Secondary | ICD-10-CM | POA: Diagnosis not present

## 2018-11-07 DIAGNOSIS — K5900 Constipation, unspecified: Secondary | ICD-10-CM

## 2018-11-07 DIAGNOSIS — I700 Atherosclerosis of aorta: Secondary | ICD-10-CM

## 2018-11-07 DIAGNOSIS — F17210 Nicotine dependence, cigarettes, uncomplicated: Secondary | ICD-10-CM

## 2018-11-07 MED ORDER — ONDANSETRON HCL 8 MG PO TABS: 8.0000 mg | ORAL_TABLET | Freq: Two times a day (BID) | ORAL | 1 refills | Status: DC | PRN

## 2018-11-07 MED ORDER — LIDOCAINE-PRILOCAINE 2.5-2.5 % EX CREA: TOPICAL_CREAM | CUTANEOUS | 3 refills | Status: DC

## 2018-11-07 MED ORDER — PROCHLORPERAZINE MALEATE 10 MG PO TABS: 10.0000 mg | ORAL_TABLET | Freq: Four times a day (QID) | ORAL | 1 refills | Status: DC | PRN

## 2018-11-07 NOTE — Progress Notes (Signed)
Hematology/Oncology  [facility] Telephone:([phone] [phone]   Patient Care Team: [name], MD as PCP - General (Internal Medicine) Clent Jacks, RN as Oncology Nurse Navigator  REFERRING PROVIDER: [name], MD  CHIEF COMPLAINTS/REASON FOR VISIT:  Follow-up for management of stage III colon cancer,  iron deficiency, lung nodules HISTORY OF PRESENTING ILLNESS:   Mary Beltran is a  78 y.o.  female with PMH listed below was seen in consultation at the request of  [name], MD  for evaluation of colon cancer Patient had acute cholecystitis and had laparoscopic cholecystectomy same day by Dr. Peyton Najjar. Patient was found to have iron deficiency anemia with hemoglobin around 9.2, ferritin 7 and iron 24. Baseline hemoglobin 1 year ago was 13.1.  Patient was started on iron supplementation with Ferrex 150 mg iron capsule daily. Reports to have chronic constipation.  Denies any blood in the stool. Patient was seen by gastroenterology and had endoscopy and colonoscopy done on 10/06/2018. There was a partially obstructing mass in the cecum which was biopsied. 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 Gastric mucosa shows reactive foveolar hyperplasia, stromal fibrosis and focal chronic inflammation.  Negative for H. pylori.  Duodenal biopsy shows no abnormalities.  No evidence of celiac disease.  Sigmoid polyps showed tubular adenoma. 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 10 pound weight loss within past few months. Denies any family history colon cancer.  Paternal grandmother passed away due to breast cancer. Per patient's request, I called patient's son [name] and sister [name], and put both of them on speaker phone so they can hear the conversation and participate in discussion.  # 10/23/2018, she underwent right hemicolectomy Pathology showed invasive moderately  differentiated adenocarcinoma involving cecum and adjacent terminal ileum with extension into pericecal adipose tissue and focally to inked serosal surface. Invasive moderately differentiated adenocarcinoma involving proximal ascending colon and ileocecal valve with extension into pericolonic adipose tissue. Tubular adenoma, sessile serrated polyp in the cecum, tubular adenoma sessile serrated polyp in the ascending colon.  Grade 2, tumor invades visceral peritoneum, all margins are negative. Pathological staging pT4a pN1c   INTERVAL HISTORY Mary Beltran is a 78 y.o. female who has above history reviewed by me today presents for follow up visit for management of stage III right colon cancer, iron deficiency, lung nodules She is getting IV venofer treatments for iron deficiency anemia. She feels fatigue is improving.  We had a discussion about adjuvant chemotherapy and radiation therapy.  She declined.  She informed infusion RN that she has changed mind and requests further discussion of options.  No new complaints.   Review of Systems  Constitutional: Negative for appetite change, chills, fatigue and fever.  HENT:   Negative for hearing loss and voice change.   Eyes: Negative for eye problems.  Respiratory: Negative for chest tightness and cough.   Cardiovascular: Negative for chest pain.  Gastrointestinal: Negative for abdominal distention, abdominal pain, blood in stool and constipation.  Endocrine: Negative for hot flashes.  Genitourinary: Negative for difficulty urinating and frequency.   Musculoskeletal: Negative for arthralgias.  Skin: Negative for itching and rash.  Neurological: Negative for extremity weakness.  Hematological: Negative for adenopathy.  Psychiatric/Behavioral: Negative for confusion.    MEDICAL HISTORY:  Past Medical History:  Diagnosis Date  . Complication of anesthesia    NOT WITH GB  . GERD (gastroesophageal reflux disease)   . IDA (iron deficiency  anemia) 10/13/2018  . PONV (postoperative  nausea and vomiting)     SURGICAL HISTORY: Past Surgical History:  Procedure Laterality Date  . ABDOMINAL HYSTERECTOMY     complete  . APPENDECTOMY    . CHOLECYSTECTOMY N/A 08/22/2018   Procedure: LAPAROSCOPIC CHOLECYSTECTOMY;  Surgeon: Herbert Pun, MD;  Location: [facility] ORS;  Service: General;  Laterality: N/A;  . COLONOSCOPY    . LAPAROSCOPIC RIGHT COLECTOMY N/A 10/23/2018   Procedure: LAPAROSCOPIC HAND ASSISTED RIGHT COLECTOMY;  Surgeon: Herbert Pun, MD;  Location: [facility] ORS;  Service: General;  Laterality: N/A;  . PERCUTANEOUS PINNING WRIST FRACTURE Right   . WRIST FOREIGN BODY REMOVAL Right    2016    SOCIAL HISTORY: Social History   Socioeconomic History  . Marital status: Divorced    Spouse name: Not on file  . Number of children: Not on file  . Years of education: Not on file  . Highest education level: Not on file  Occupational History  . Occupation: retired  Scientific laboratory technician  . Financial resource strain: Not on file  . Food insecurity    Worry: Not on file    Inability: Not on file  . Transportation needs    Medical: Not on file    Non-medical: Not on file  Tobacco Use  . Smoking status: Current Every Day Smoker    Packs/day: 0.25    Years: 60.00    Pack years: 15.00  . Smokeless tobacco: Never Used  Substance and Sexual Activity  . Alcohol use: No  . Drug use: Not Currently  . Sexual activity: Not on file  Lifestyle  . Physical activity    Days per week: Not on file    Minutes per session: Not on file  . Stress: Not on file  Relationships  . Social Herbalist on phone: Not on file    Gets together: Not on file    Attends religious service: Not on file    Active member of club or organization: Not on file    Attends meetings of clubs or organizations: Not on file    Relationship status: Not on file  . Intimate partner violence    Fear of current or ex partner: Not on file     Emotionally abused: Not on file    Physically abused: Not on file    Forced sexual activity: Not on file  Other Topics Concern  . Not on file  Social History Narrative  . Not on file    FAMILY HISTORY: Family History  Problem Relation Age of Onset  . COPD Mother   . Suicidality Father   . Breast cancer Paternal Grandmother   . Failure to thrive Sister     ALLERGIES:  is allergic to pain & fever [acetaminophen]; sulfa antibiotics; codeine; and penicillins.  MEDICATIONS:  Current Outpatient Medications  Medication Sig Dispense Refill  . gabapentin (NEURONTIN) 300 MG capsule Take 1 capsule (300 mg total) by mouth 2 (two) times daily. 30 capsule 0  . nabumetone (RELAFEN) 500 MG tablet Take 1 tablet (500 mg total) by mouth 2 (two) times daily for 15 days. 30 tablet 0   No current facility-administered medications for this visit.      PHYSICAL EXAMINATION: ECOG PERFORMANCE STATUS: 1 - Symptomatic but completely ambulatory Physical Exam Constitutional:      General: She is not in acute distress.    Comments: Thin, walk in   HENT:     Head: Normocephalic and atraumatic.  Eyes:     General: No  scleral icterus.    Pupils: Pupils are equal, round, and reactive to light.  Neck:     Musculoskeletal: Normal range of motion and neck supple.  Cardiovascular:     Rate and Rhythm: Normal rate and regular rhythm.     Heart sounds: Normal heart sounds.  Pulmonary:     Effort: Pulmonary effort is normal. No respiratory distress.     Breath sounds: No wheezing.  Abdominal:     General: Bowel sounds are normal. There is no distension.     Palpations: Abdomen is soft. There is no mass.     Tenderness: There is no abdominal tenderness.  Musculoskeletal: Normal range of motion.        General: No deformity.  Skin:    General: Skin is warm and dry.     Coloration: Skin is not pale.     Findings: No erythema or rash.  Neurological:     Mental Status: She is alert and oriented to  person, place, and time.     Cranial Nerves: No cranial nerve deficit.     Coordination: Coordination normal.  Psychiatric:        Behavior: Behavior normal.        Thought Content: Thought content normal.     LABORATORY DATA:  I have reviewed the data as listed Lab Results  Component Value Date   WBC 8.6 10/26/2018   HGB 8.0 (L) 10/30/2018   HCT 26.2 (L) 10/30/2018   MCV 81.4 10/26/2018   PLT 328 10/26/2018   Recent Labs    10/13/18 1154 10/24/18 0248 10/26/18 0504  NA 135 134* 136  K 3.9 3.8 3.9  CL 102 102 110  CO2 21* 22 22  GLUCOSE 106* 114* 106*  BUN 9 11 11   CREATININE 0.67 0.72 0.83  CALCIUM 8.9 8.3* 8.0*  GFRNONAA >60 >60 >60  GFRAA >60 >60 >60  PROT 7.4  --   --   ALBUMIN 4.0  --   --   AST 18  --   --   ALT 11  --   --   ALKPHOS 78  --   --   BILITOT 0.5  --   --    Iron/TIBC/Ferritin/ %Sat    Component Value Date/Time   IRON 20 (L) 10/13/2018 1154   TIBC 547 (H) 10/13/2018 1154   FERRITIN 6 (L) 10/13/2018 1154   IRONPCTSAT 4 (L) 10/13/2018 1154      RADIOGRAPHIC STUDIES: I have personally reviewed the radiological images as listed and agreed with the findings in the report.  Ct Chest W Contrast  Result Date: 10/09/2018 CLINICAL DATA:  Possible colon mass seen on colonoscopy 4 days ago. EXAM: CT CHEST, ABDOMEN, AND PELVIS WITH CONTRAST TECHNIQUE: Multidetector CT imaging of the chest, abdomen and pelvis was performed following the standard protocol during bolus administration of intravenous contrast. CONTRAST:  182m OMNIPAQUE IOHEXOL 300 MG/ML  SOLN COMPARISON:  04/23/2015 FINDINGS: CT CHEST FINDINGS Cardiovascular: The heart size is normal. No substantial pericardial effusion. Coronary artery calcification is evident. Atherosclerotic calcification is noted in the wall of the thoracic aorta. Mediastinum/Nodes: 11 mm short axis precarinal lymph node is stable. Other upper normal mediastinal and hilar lymphadenopathy is similar to prior. The esophagus  has normal imaging features. There is no axillary lymphadenopathy. Lungs/Pleura: The central tracheobronchial airways are patent. Centrilobular emphsyema noted. Calcified granuloma noted posterior right upper lobe. Noncalcified 2 mm right upper lobe nodule visible on 57/5, new since prior study. 2 mm  right upper lobe nodule also visible on 46/5. 7 mm sub solid left upper lobe nodule (54/5) was 6 mm previously. Musculoskeletal: No worrisome lytic or sclerotic osseous abnormality. CT ABDOMEN PELVIS FINDINGS Hepatobiliary: No suspicious focal abnormality within the liver parenchyma. Gallbladder is surgically absent. No intrahepatic or extrahepatic biliary dilation. Pancreas: No focal mass lesion. No dilatation of the main duct. No intraparenchymal cyst. No peripancreatic edema. Spleen: No splenomegaly. No focal mass lesion. Adrenals/Urinary Tract: Left adrenal thickening evident without discrete nodule or mass. Right adrenal gland unremarkable 5 mm low-density lesion interpolar right kidney is too small to characterize. Similar 3 mm low-density lesion identified lower pole right kidney. Left kidney unremarkable. No evidence for hydroureter. The urinary bladder appears normal for the degree of distention. Stomach/Bowel: Stomach is unremarkable. Areas of apparent wall thickening in the gastric cardia and body of stomach are indeterminate. No evidence of outlet obstruction. Duodenum is normally positioned as is the ligament of Treitz. No small bowel wall thickening. No small bowel dilatation. Mild wall thickening noted in the terminal ileum and ileocecal valve. The appendix is not visualized, but there is no edema or inflammation in the region of the cecum. Circumferential wall thickening noted in the cecum at the level of the ileocecal valve. Colonic wall is ill-defined in there is pericolonic soft tissue stranding in this region. Left colon unremarkable. Vascular/Lymphatic: There is abdominal aortic atherosclerosis  without aneurysm. There is no gastrohepatic or hepatoduodenal ligament lymphadenopathy. Small lymph nodes are noted in the ileocolic mesentery measuring up to 6 mm short axis (image 89/series 3. No retroperitoneal lymphadenopathy. No pelvic sidewall lymphadenopathy. Reproductive: The uterus is surgically absent. There is no adnexal mass. Other: No intraperitoneal free fluid. Musculoskeletal: No worrisome lytic or sclerotic osseous abnormality. IMPRESSION: 1. Circumferential irregular wall thickening in the cecum, involving the ileocecal valve with some wall thickening in the terminal ileum as well. There is soft tissue stranding around the abnormal cecal wall raising the question of transmural extension of tumor. Small lymph nodes are noted in the adjacent ileocolic mesentery a metastatic disease cannot be excluded. 2. No evidence for hepatic metastases. 3. Tiny pulmonary nodules, some of which are new since 04/13/2015. Attention on follow-up recommended to exclude metastatic involvement. 4.  Aortic Atherosclerois (ICD10-170.0) 5.  Emphysema. ([id number]) Electronically Signed   By: Misty Stanley M.D.   On: 10/09/2018 12:59   Ct Abdomen Pelvis W Contrast  Result Date: 10/09/2018 CLINICAL DATA:  Possible colon mass seen on colonoscopy 4 days ago. EXAM: CT CHEST, ABDOMEN, AND PELVIS WITH CONTRAST TECHNIQUE: Multidetector CT imaging of the chest, abdomen and pelvis was performed following the standard protocol during bolus administration of intravenous contrast. CONTRAST:  130m OMNIPAQUE IOHEXOL 300 MG/ML  SOLN COMPARISON:  04/23/2015 FINDINGS: CT CHEST FINDINGS Cardiovascular: The heart size is normal. No substantial pericardial effusion. Coronary artery calcification is evident. Atherosclerotic calcification is noted in the wall of the thoracic aorta. Mediastinum/Nodes: 11 mm short axis precarinal lymph node is stable. Other upper normal mediastinal and hilar lymphadenopathy is similar to prior. The esophagus  has normal imaging features. There is no axillary lymphadenopathy. Lungs/Pleura: The central tracheobronchial airways are patent. Centrilobular emphsyema noted. Calcified granuloma noted posterior right upper lobe. Noncalcified 2 mm right upper lobe nodule visible on 57/5, new since prior study. 2 mm right upper lobe nodule also visible on 46/5. 7 mm sub solid left upper lobe nodule (54/5) was 6 mm previously. Musculoskeletal: No worrisome lytic or sclerotic osseous abnormality. CT ABDOMEN PELVIS FINDINGS Hepatobiliary:  No suspicious focal abnormality within the liver parenchyma. Gallbladder is surgically absent. No intrahepatic or extrahepatic biliary dilation. Pancreas: No focal mass lesion. No dilatation of the main duct. No intraparenchymal cyst. No peripancreatic edema. Spleen: No splenomegaly. No focal mass lesion. Adrenals/Urinary Tract: Left adrenal thickening evident without discrete nodule or mass. Right adrenal gland unremarkable 5 mm low-density lesion interpolar right kidney is too small to characterize. Similar 3 mm low-density lesion identified lower pole right kidney. Left kidney unremarkable. No evidence for hydroureter. The urinary bladder appears normal for the degree of distention. Stomach/Bowel: Stomach is unremarkable. Areas of apparent wall thickening in the gastric cardia and body of stomach are indeterminate. No evidence of outlet obstruction. Duodenum is normally positioned as is the ligament of Treitz. No small bowel wall thickening. No small bowel dilatation. Mild wall thickening noted in the terminal ileum and ileocecal valve. The appendix is not visualized, but there is no edema or inflammation in the region of the cecum. Circumferential wall thickening noted in the cecum at the level of the ileocecal valve. Colonic wall is ill-defined in there is pericolonic soft tissue stranding in this region. Left colon unremarkable. Vascular/Lymphatic: There is abdominal aortic atherosclerosis  without aneurysm. There is no gastrohepatic or hepatoduodenal ligament lymphadenopathy. Small lymph nodes are noted in the ileocolic mesentery measuring up to 6 mm short axis (image 89/series 3. No retroperitoneal lymphadenopathy. No pelvic sidewall lymphadenopathy. Reproductive: The uterus is surgically absent. There is no adnexal mass. Other: No intraperitoneal free fluid. Musculoskeletal: No worrisome lytic or sclerotic osseous abnormality. IMPRESSION: 1. Circumferential irregular wall thickening in the cecum, involving the ileocecal valve with some wall thickening in the terminal ileum as well. There is soft tissue stranding around the abnormal cecal wall raising the question of transmural extension of tumor. Small lymph nodes are noted in the adjacent ileocolic mesentery a metastatic disease cannot be excluded. 2. No evidence for hepatic metastases. 3. Tiny pulmonary nodules, some of which are new since 04/13/2015. Attention on follow-up recommended to exclude metastatic involvement. 4.  Aortic Atherosclerois (ICD10-170.0) 5.  Emphysema. ([id number]) Electronically Signed   By: Misty Stanley M.D.   On: 10/09/2018 12:59      ASSESSMENT & PLAN:  1. Malignant neoplasm of ascending colon ([place])   2. Iron deficiency anemia due to chronic blood loss   3. Goals of care, counseling/discussion   Cancer Staging Cancer of right colon [facility]) Staging form: Colon and Rectum, AJCC 8th Edition - Pathologic stage from 10/31/2018: Stage IIIB (pT4a, pN1c, cM0) - Signed by [name], MD on 10/31/2018   #Stage IIIB right colon adenocarcinoma Pathology was reviewed and discussed with patient.  The rationale of adjuvant chemotherapy was discussed.  Recommend adjuvant chemotherapy 5-FU for 6 months. Goal of care was discussed.  Chemotherapy is with curative intent, goal of the adjuvant chemotherapy is to reduce local and systemic recurrence. Chemotherapy education was provided.  We had discussed the composition of  chemotherapy regimen, length of chemo cycle, duration of treatment and the time to assess response to treatment.    I explained to the patient the risks and benefits of chemotherapy 5 FU  including all but not limited to hair loss, mouth sore, nausea, vomiting, diarrhea, low blood counts, bleeding, skin peeling, neuropathy and risk of life threatening infection and even death, secondary malignancy etc. Patient voices understanding and willing to proceed chemotherapy.  Recommend to start adjuvant chemotherapy within 8 weeks.   # Chemotherapy education; refer to [facility] for Medi- port placement.  Antiemetics-Zofran and Compazine; EMLA cream sent to pharmacy  Supportive care measures are necessary for patient well-being and will be provided as necessary. We spent sufficient time to discuss many aspect of care, questions were answered to patient's satisfaction.  #Iron deficiency anemia, continue planned weekly IV venofer treatments.   #Small lung nodules, attention on follow-up scans. #Family history of breast cancer, personal history of colon cancer.  Recommend genetic testing.  Patient wants to rediscuss in the future. . We spent sufficient time to discuss many aspect of care, questions were answered to patient's satisfaction.    Orders Placed This Encounter  Procedures  . CBC with Differential    Standing Status:   Standing    Number of Occurrences:   20    Standing Expiration Date:   11/08/2019  . Comprehensive metabolic panel    Standing Status:   Standing    Number of Occurrences:   20    Standing Expiration Date:   11/08/2019   We spent sufficient time to discuss many aspect of care, questions were answered to patient's satisfaction. Total face to face encounter time for this patient visit was 25 min. >50% of the time was  spent in counseling and coordination of care.   cc [name], MD  Dr. Peyton Najjar. Follow up in early September for evaluation prior to chemotherapy  treatment.   [name], MD, PhD Hematology Oncology [place] [facility] 11/07/2018

## 2018-11-07 NOTE — Progress Notes (Signed)
START ON PATHWAY REGIMEN - Colorectal     A cycle is every 14 days:     Leucovorin      Fluorouracil      Fluorouracil   **Always confirm dose/schedule in your pharmacy ordering system**  Patient Characteristics: Postoperative without Neoadjuvant Therapy (Pathologic Staging), Colon, Stage III, High Risk (pT4 or pN2) Tumor Location: Colon Therapeutic Status: Postoperative without Neoadjuvant Therapy (Pathologic Staging) AJCC M Category: cM0 AJCC T Category: pT4a AJCC N Category: pN1c AJCC 8 Stage Grouping: IIIB Intent of Therapy: Curative Intent, Discussed with Patient

## 2018-11-10 NOTE — Patient Instructions (Signed)
Fluorouracil, 5-FU injection What is this medicine? FLUOROURACIL, 5-FU (flure oh YOOR a sil) is a chemotherapy drug. It slows the growth of cancer cells. This medicine is used to treat many types of cancer like breast cancer, colon or rectal cancer, pancreatic cancer, and stomach cancer. This medicine may be used for other purposes; ask your health care provider or pharmacist if you have questions. COMMON BRAND NAME(S): Adrucil What should I tell my health care provider before I take this medicine? They need to know if you have any of these conditions:  blood disorders  dihydropyrimidine dehydrogenase (DPD) deficiency  infection (especially a virus infection such as chickenpox, cold sores, or herpes)  kidney disease  liver disease  malnourished, poor nutrition  recent or ongoing radiation therapy  an unusual or allergic reaction to fluorouracil, other chemotherapy, other medicines, foods, dyes, or preservatives  pregnant or trying to get pregnant  breast-feeding How should I use this medicine? This drug is given as an infusion or injection into a vein. It is administered in a hospital or clinic by a specially trained health care professional. Talk to your pediatrician regarding the use of this medicine in children. Special care may be needed. Overdosage: If you think you have taken too much of this medicine contact a poison control center or emergency room at once. NOTE: This medicine is only for you. Do not share this medicine with others. What if I miss a dose? It is important not to miss your dose. Call your doctor or health care professional if you are unable to keep an appointment. What may interact with this medicine?  allopurinol  cimetidine  dapsone  digoxin  hydroxyurea  leucovorin  levamisole  medicines for seizures like ethotoin, fosphenytoin, phenytoin  medicines to increase blood counts like filgrastim, pegfilgrastim, sargramostim  medicines that  treat or prevent blood clots like warfarin, enoxaparin, and dalteparin  methotrexate  metronidazole  pyrimethamine  some other chemotherapy drugs like busulfan, cisplatin, estramustine, vinblastine  trimethoprim  trimetrexate  vaccines Talk to your doctor or health care professional before taking any of these medicines:  acetaminophen  aspirin  ibuprofen  ketoprofen  naproxen This list may not describe all possible interactions. Give your health care provider a list of all the medicines, herbs, non-prescription drugs, or dietary supplements you use. Also tell them if you smoke, drink alcohol, or use illegal drugs. Some items may interact with your medicine. What should I watch for while using this medicine? Visit your doctor for checks on your progress. This drug may make you feel generally unwell. This is not uncommon, as chemotherapy can affect healthy cells as well as cancer cells. Report any side effects. Continue your course of treatment even though you feel ill unless your doctor tells you to stop. In some cases, you may be given additional medicines to help with side effects. Follow all directions for their use. Call your doctor or health care professional for advice if you get a fever, chills or sore throat, or other symptoms of a cold or flu. Do not treat yourself. This drug decreases your body's ability to fight infections. Try to avoid being around people who are sick. This medicine may increase your risk to bruise or bleed. Call your doctor or health care professional if you notice any unusual bleeding. Be careful brushing and flossing your teeth or using a toothpick because you may get an infection or bleed more easily. If you have any dental work done, tell your dentist you are  receiving this medicine. Avoid taking products that contain aspirin, acetaminophen, ibuprofen, naproxen, or ketoprofen unless instructed by your doctor. These medicines may hide a fever. Do not  become pregnant while taking this medicine. Women should inform their doctor if they wish to become pregnant or think they might be pregnant. There is a potential for serious side effects to an unborn child. Talk to your health care professional or pharmacist for more information. Do not breast-feed an infant while taking this medicine. Men should inform their doctor if they wish to father a child. This medicine may lower sperm counts. Do not treat diarrhea with over the counter products. Contact your doctor if you have diarrhea that lasts more than 2 days or if it is severe and watery. This medicine can make you more sensitive to the sun. Keep out of the sun. If you cannot avoid being in the sun, wear protective clothing and use sunscreen. Do not use sun lamps or tanning beds/booths. What side effects may I notice from receiving this medicine? Side effects that you should report to your doctor or health care professional as soon as possible:  allergic reactions like skin rash, itching or hives, swelling of the face, lips, or tongue  low blood counts - this medicine may decrease the number of white blood cells, red blood cells and platelets. You may be at increased risk for infections and bleeding.  signs of infection - fever or chills, cough, sore throat, pain or difficulty passing urine  signs of decreased platelets or bleeding - bruising, pinpoint red spots on the skin, black, tarry stools, blood in the urine  signs of decreased red blood cells - unusually weak or tired, fainting spells, lightheadedness  breathing problems  changes in vision  chest pain  mouth sores  nausea and vomiting  pain, swelling, redness at site where injected  pain, tingling, numbness in the hands or feet  redness, swelling, or sores on hands or feet  stomach pain  unusual bleeding Side effects that usually do not require medical attention (report to your doctor or health care professional if they  continue or are bothersome):  changes in finger or toe nails  diarrhea  dry or itchy skin  hair loss  headache  loss of appetite  sensitivity of eyes to the light  stomach upset  unusually teary eyes This list may not describe all possible side effects. Call your doctor for medical advice about side effects. You may report side effects to FDA at 1-800-FDA-1088. Where should I keep my medicine? This drug is given in a hospital or clinic and will not be stored at home. NOTE: This sheet is a summary. It may not cover all possible information. If you have questions about this medicine, talk to your doctor, pharmacist, or health care provider.  2020 [employer] (2007-07-30 13:53:16) Leucovorin injection What is this medicine? LEUCOVORIN (loo koe VOR in) is used to prevent or treat the harmful effects of some medicines. This medicine is used to treat anemia caused by a low amount of folic acid in the body. It is also used with 5-fluorouracil (5-FU) to treat colon cancer. This medicine may be used for other purposes; ask your health care provider or pharmacist if you have questions. What should I tell my health care provider before I take this medicine? They need to know if you have any of these conditions:  anemia from low levels of vitamin B-12 in the blood  an unusual or allergic reaction to  leucovorin, folic acid, other medicines, foods, dyes, or preservatives  pregnant or trying to get pregnant  breast-feeding How should I use this medicine? This medicine is for injection into a muscle or into a vein. It is given by a health care professional in a hospital or clinic setting. Talk to your pediatrician regarding the use of this medicine in children. Special care may be needed. Overdosage: If you think you have taken too much of this medicine contact a poison control center or emergency room at once. NOTE: This medicine is only for you. Do not share this medicine with  others. What if I miss a dose? This does not apply. What may interact with this medicine?  capecitabine  fluorouracil  phenobarbital  phenytoin  primidone  trimethoprim-sulfamethoxazole This list may not describe all possible interactions. Give your health care provider a list of all the medicines, herbs, non-prescription drugs, or dietary supplements you use. Also tell them if you smoke, drink alcohol, or use illegal drugs. Some items may interact with your medicine. What should I watch for while using this medicine? Your condition will be monitored carefully while you are receiving this medicine. This medicine may increase the side effects of 5-fluorouracil, 5-FU. Tell your doctor or health care professional if you have diarrhea or mouth sores that do not get better or that get worse. What side effects may I notice from receiving this medicine? Side effects that you should report to your doctor or health care professional as soon as possible:  allergic reactions like skin rash, itching or hives, swelling of the face, lips, or tongue  breathing problems  fever, infection  mouth sores  unusual bleeding or bruising  unusually weak or tired Side effects that usually do not require medical attention (report to your doctor or health care professional if they continue or are bothersome):  constipation or diarrhea  loss of appetite  nausea, vomiting This list may not describe all possible side effects. Call your doctor for medical advice about side effects. You may report side effects to FDA at 1-800-FDA-1088. Where should I keep my medicine? This drug is given in a hospital or clinic and will not be stored at home. NOTE: This sheet is a summary. It may not cover all possible information. If you have questions about this medicine, talk to your doctor, pharmacist, or health care provider.  2020 [employer] Standard (2007-09-30 16:50:29)

## 2018-11-12 ENCOUNTER — Other Ambulatory Visit: Payer: Self-pay

## 2018-11-13 ENCOUNTER — Ambulatory Visit: Payer: Medicare HMO | Admitting: Oncology

## 2018-11-13 ENCOUNTER — Other Ambulatory Visit: Payer: Self-pay

## 2018-11-13 ENCOUNTER — Inpatient Hospital Stay: Payer: Medicare HMO | Attending: Oncology

## 2018-11-13 VITALS — BP 126/72 | HR 81 | Temp 97.9°F | Resp 18

## 2018-11-13 DIAGNOSIS — C182 Malignant neoplasm of ascending colon: Secondary | ICD-10-CM | POA: Diagnosis not present

## 2018-11-13 DIAGNOSIS — R634 Abnormal weight loss: Secondary | ICD-10-CM | POA: Diagnosis not present

## 2018-11-13 DIAGNOSIS — D509 Iron deficiency anemia, unspecified: Secondary | ICD-10-CM | POA: Diagnosis not present

## 2018-11-13 DIAGNOSIS — R69 Illness, unspecified: Secondary | ICD-10-CM | POA: Diagnosis not present

## 2018-11-13 DIAGNOSIS — J449 Chronic obstructive pulmonary disease, unspecified: Secondary | ICD-10-CM | POA: Insufficient documentation

## 2018-11-13 DIAGNOSIS — Z79899 Other long term (current) drug therapy: Secondary | ICD-10-CM | POA: Insufficient documentation

## 2018-11-13 DIAGNOSIS — F17210 Nicotine dependence, cigarettes, uncomplicated: Secondary | ICD-10-CM | POA: Insufficient documentation

## 2018-11-13 DIAGNOSIS — F419 Anxiety disorder, unspecified: Secondary | ICD-10-CM | POA: Insufficient documentation

## 2018-11-13 DIAGNOSIS — Z515 Encounter for palliative care: Secondary | ICD-10-CM | POA: Diagnosis not present

## 2018-11-13 DIAGNOSIS — D500 Iron deficiency anemia secondary to blood loss (chronic): Secondary | ICD-10-CM

## 2018-11-13 DIAGNOSIS — J439 Emphysema, unspecified: Secondary | ICD-10-CM | POA: Diagnosis not present

## 2018-11-13 DIAGNOSIS — K219 Gastro-esophageal reflux disease without esophagitis: Secondary | ICD-10-CM | POA: Insufficient documentation

## 2018-11-13 MED ORDER — SODIUM CHLORIDE 0.9 % IV SOLN
INTRAVENOUS | Status: DC
  Administered 2018-11-13: 14:00:00 via INTRAVENOUS
  Filled 2018-11-13: qty 250

## 2018-11-13 MED ORDER — IRON SUCROSE 20 MG/ML IV SOLN
200.0000 mg | Freq: Once | INTRAVENOUS | Status: AC
  Administered 2018-11-13: 200 mg via INTRAVENOUS
  Filled 2018-11-13: qty 10

## 2018-11-14 ENCOUNTER — Ambulatory Visit: Payer: Medicare HMO | Admitting: Oncology

## 2018-11-14 ENCOUNTER — Other Ambulatory Visit: Payer: Self-pay

## 2018-11-17 ENCOUNTER — Encounter: Payer: Self-pay | Admitting: Nurse Practitioner

## 2018-11-17 ENCOUNTER — Inpatient Hospital Stay: Payer: Medicare HMO

## 2018-11-17 ENCOUNTER — Inpatient Hospital Stay (HOSPITAL_BASED_OUTPATIENT_CLINIC_OR_DEPARTMENT_OTHER): Payer: Medicare HMO | Admitting: Nurse Practitioner

## 2018-11-17 ENCOUNTER — Other Ambulatory Visit: Payer: Self-pay

## 2018-11-17 VITALS — BP 166/80 | HR 72 | Temp 97.5°F | Resp 16

## 2018-11-17 DIAGNOSIS — C182 Malignant neoplasm of ascending colon: Secondary | ICD-10-CM | POA: Diagnosis not present

## 2018-11-17 DIAGNOSIS — J449 Chronic obstructive pulmonary disease, unspecified: Secondary | ICD-10-CM | POA: Diagnosis not present

## 2018-11-17 DIAGNOSIS — R69 Illness, unspecified: Secondary | ICD-10-CM | POA: Diagnosis not present

## 2018-11-17 DIAGNOSIS — K219 Gastro-esophageal reflux disease without esophagitis: Secondary | ICD-10-CM | POA: Diagnosis not present

## 2018-11-17 DIAGNOSIS — R634 Abnormal weight loss: Secondary | ICD-10-CM | POA: Diagnosis not present

## 2018-11-17 DIAGNOSIS — D509 Iron deficiency anemia, unspecified: Secondary | ICD-10-CM | POA: Diagnosis not present

## 2018-11-17 DIAGNOSIS — Z515 Encounter for palliative care: Secondary | ICD-10-CM | POA: Diagnosis not present

## 2018-11-17 DIAGNOSIS — J439 Emphysema, unspecified: Secondary | ICD-10-CM | POA: Diagnosis not present

## 2018-11-17 DIAGNOSIS — Z79899 Other long term (current) drug therapy: Secondary | ICD-10-CM | POA: Diagnosis not present

## 2018-11-17 NOTE — Progress Notes (Signed)
[place]  [phone] [phone]  Patient Care Team: [name], MD as PCP - General (Internal Medicine) Clent Jacks, RN as Oncology Nurse Navigator   Name of the patient: Mary Beltran  [id number]  Janann 05, 1942   Date of visit: 11/17/18  Diagnosis- Stage III Colon Cancer  Chief complaint/Reason for visit- Initial Meeting for [facility], preparing for starting chemotherapy  Heme/Onc history:  Oncology History  Cancer of right colon ([place])  10/23/2018 Initial Diagnosis   Cancer of right colon ([place])   10/31/2018 Cancer Staging   Staging form: Colon and Rectum, AJCC 8th Edition - Pathologic stage from 10/31/2018: Stage IIIB (pT4a, pN1c, cM0) - Signed by [name], MD on 10/31/2018   12/17/2018 -  Chemotherapy   The patient had leucovorin 692 mg in dextrose 5 % 250 mL infusion, 400 mg/m2, Intravenous,  Once, 0 of 12 cycles fluorouracil (ADRUCIL) chemo injection 700 mg, 400 mg/m2, Intravenous,  Once, 0 of 12 cycles fluorouracil (ADRUCIL) 4,150 mg in sodium chloride 0.9 % 67 mL chemo infusion, 2,400 mg/m2, Intravenous, 1 Day/Dose, 0 of 12 cycles  for chemotherapy treatment.      Interval history-  Kagan Bonilla, 78 years old with new diagnosis of stage III colon cancer, who presents to chemo care clinic today for initial meeting in preparation for starting chemotherapy. I introduced the chemo care clinic and we discussed that the role of the clinic is to assist those who are at an increased risk of emergency room visits and/or complications during the course of chemotherapy treatment. We discussed that the increased risk takes into account factors such as age, performance status, and co-morbidities. We also discussed that for some, this might include barriers to care such as not having a primary care provider, lack of insurance/transportation, or not being able to afford medications. We discussed  that the goal of the program is to help prevent unplanned ER visits and help reduce complications during chemotherapy. We do this by discussing specific risk factors to each individual and identifying ways that we can help improve these risk factors and reduce barriers to care.   She says that she is feeling somewhat anxious and overwhelmed by this diagnosis and starting chemotherapy. We dicussed that she was specifically identified as moderate risk based on recent hospital admission, anemia, history of COPD, not identifying as being in a relationship.  She participated in chemo education class today and has port placement on 12/08/2018.  She lives in a condo on the second floor with 11 steps.  Is divorced and has 2 sons, both of whom live out of town.  Is previously discussed her youngest son [name] being HCPOA but would like to reflect that in paperwork.  Has a sister whom she is close with who lives nearby.  ECOG FS:1 - Symptomatic but completely ambulatory  Review of systems- Review of Systems  Constitutional: Positive for weight loss. Negative for chills, diaphoresis, fever and malaise/fatigue.  HENT: Negative for congestion and sore throat.   Eyes: Negative for blurred vision and pain.  Respiratory: Negative for cough, sputum production, shortness of breath and wheezing.   Cardiovascular: Negative for chest pain, palpitations and leg swelling.  Gastrointestinal: Negative for abdominal pain, blood in stool, constipation, diarrhea, heartburn, melena and nausea.  Genitourinary: Negative for frequency and hematuria.  Musculoskeletal: Negative for back pain, falls, joint pain and myalgias.  Skin: Negative for itching and rash.  Neurological: Negative for dizziness  and weakness.  Psychiatric/Behavioral: Negative for depression. The patient is nervous/anxious. The patient does not have insomnia.      Current treatment- s/p hemicolectomy; plan for adjuvant chemotherapy starting in early  September  Allergies  Allergen Reactions   Pain & Fever [Acetaminophen]     Patient states all pain medications make her "vomit"   Sulfa Antibiotics Nausea And Vomiting   Codeine    Penicillins Other (See Comments)    Did it involve swelling of the face/tongue/throat, SOB, or low BP? Yes Did it involve sudden or severe rash/hives, skin peeling, or any reaction on the inside of your mouth or nose? Yes Did you need to seek medical attention at a hospital or doctor's office? Yes When did it last happen?in her 8s If all above answers are NO, may proceed with cephalosporin use.     Past Medical History:  Diagnosis Date   Complication of anesthesia    NOT WITH GB   GERD (gastroesophageal reflux disease)    IDA (iron deficiency anemia) 10/13/2018   PONV (postoperative nausea and vomiting)     Past Surgical History:  Procedure Laterality Date   ABDOMINAL HYSTERECTOMY     complete   APPENDECTOMY     CHOLECYSTECTOMY N/A 08/22/2018   Procedure: LAPAROSCOPIC CHOLECYSTECTOMY;  Surgeon: Herbert Pun, MD;  Location: [facility] ORS;  Service: General;  Laterality: N/A;   COLONOSCOPY     LAPAROSCOPIC RIGHT COLECTOMY N/A 10/23/2018   Procedure: LAPAROSCOPIC HAND ASSISTED RIGHT COLECTOMY;  Surgeon: Herbert Pun, MD;  Location: [facility] ORS;  Service: General;  Laterality: N/A;   PERCUTANEOUS PINNING WRIST FRACTURE Right    WRIST FOREIGN BODY REMOVAL Right    2016    Social History   Socioeconomic History   Marital status: Divorced    Spouse name: Not on file   Number of children: Not on file   Years of education: Not on file   Highest education level: Not on file  Occupational History   Occupation: retired  Scientist, product/process development strain: Not on file   Food insecurity    Worry: Not on file    Inability: Not on Lexicographer needs    Medical: Not on file    Non-medical: Not on file  Tobacco Use   Smoking status: Current  Every Day Smoker    Packs/day: 0.25    Years: 60.00    Pack years: 15.00    Types: Cigarettes   Smokeless tobacco: Never Used   Tobacco comment: Patient states that this is the only thing she enjoys  Substance and Sexual Activity   Alcohol use: No   Drug use: Not Currently   Sexual activity: Not on file  Lifestyle   Physical activity    Days per week: Not on file    Minutes per session: Not on file   Stress: Not on file  Relationships   Social connections    Talks on phone: Not on file    Gets together: Not on file    Attends religious service: Not on file    Active member of club or organization: Not on file    Attends meetings of clubs or organizations: Not on file    Relationship status: Not on file   Intimate partner violence    Fear of current or ex partner: Not on file    Emotionally abused: Not on file    Physically abused: Not on file    Forced sexual activity: Not on  file  Other Topics Concern   Not on file  Social History Narrative   Not on file    Family History  Problem Relation Age of Onset   COPD Mother    Suicidality Father    Breast cancer Paternal Grandmother    Failure to thrive Sister      Current Outpatient Medications:    gabapentin (NEURONTIN) 300 MG capsule, Take 1 capsule (300 mg total) by mouth 2 (two) times daily. (Patient not taking: Reported on 11/17/2018), Disp: 30 capsule, Rfl: 0   lidocaine-prilocaine (EMLA) cream, Apply to affected area once (Patient not taking: Reported on 11/17/2018), Disp: 30 g, Rfl: 3   ondansetron (ZOFRAN) 8 MG tablet, Take 1 tablet (8 mg total) by mouth 2 (two) times daily as needed (Nausea or vomiting). (Patient not taking: Reported on 11/17/2018), Disp: 30 tablet, Rfl: 1   prochlorperazine (COMPAZINE) 10 MG tablet, Take 1 tablet (10 mg total) by mouth every 6 (six) hours as needed (Nausea or vomiting). (Patient not taking: Reported on 11/17/2018), Disp: 30 tablet, Rfl: 1  Physical exam:  Vitals:    11/17/18 1122  BP: (!) 166/80  Pulse: 72  Resp: 16  Temp: (!) 97.5 F (36.4 C)  TempSrc: Tympanic  SpO2: 99%   Physical Exam Constitutional:      General: She is not in acute distress.    Appearance: Normal appearance.     Comments: Thin built, seen in exam room.  Unaccompanied.  HENT:     Head: Normocephalic and atraumatic.     Mouth/Throat:     Mouth: Mucous membranes are dry.     Pharynx: Oropharynx is clear.  Eyes:     General: No scleral icterus.    Conjunctiva/sclera: Conjunctivae normal.  Neck:     Musculoskeletal: Neck supple. No neck rigidity.  Cardiovascular:     Pulses: Normal pulses.     Heart sounds: Normal heart sounds.  Pulmonary:     Effort: Pulmonary effort is normal. No respiratory distress.  Abdominal:     General: There is no distension.     Tenderness: There is no abdominal tenderness.  Musculoskeletal: Normal range of motion.        General: No deformity.  Skin:    General: Skin is warm and dry.  Neurological:     Mental Status: She is alert and oriented to person, place, and time.  Psychiatric:        Mood and Affect: Mood is anxious.        Behavior: Behavior normal.      CMP Latest Ref Rng & Units 10/26/2018  Glucose 70 - 99 mg/dL 106(H)  BUN 8 - 23 mg/dL 11  Creatinine 0.44 - 1.00 mg/dL 0.83  Sodium 135 - 145 mmol/L 136  Potassium 3.5 - 5.1 mmol/L 3.9  Chloride 98 - 111 mmol/L 110  CO2 22 - 32 mmol/L 22  Calcium 8.9 - 10.3 mg/dL 8.0(L)  Total Protein 6.5 - 8.1 g/dL -  Total Bilirubin 0.3 - 1.2 mg/dL -  Alkaline Phos 38 - 126 U/L -  AST 15 - 41 U/L -  ALT 0 - 44 U/L -   CBC Latest Ref Rng & Units 10/30/2018  WBC 4.0 - 10.5 K/uL -  Hemoglobin 12.0 - 15.0 g/dL 8.0(L)  Hematocrit 36.0 - 46.0 % 26.2(L)  Platelets 150 - 400 K/uL -    No images are attached to the encounter.  No results found.   Assessment and plan- Patient is a  78 y.o. female who presents to [facility] for initial meeting in preparation for starting  chemotherapy for the treatment of colon cancer.   1. Stage IIIB right colon cancer-she underwent colonoscopy on 10/06/2018 which showed partially obstructing mass in the cecum.  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  She underwent right hemicolectomy on 10/23/2018.  Pathologic stage pT4a pN1c.  Adjuvant chemotherapy and radiation was recommended but initially patient declined.  After discussing with her family she opted for adjuvant chemotherapy with leucovorin and 5-FU for 6 months.  Chemotherapy given with curative intent and to reduce local and systemic recurrence.  Adjuvant radiation was also recommended.  2. Chemo Care Clinic/High Risk for ER/Hospitalization during chemotherapy- We discussed the role of the chemo care clinic and identified patient specific risk factors. I discussed that patient was identified as high risk primarily based on: Recent hospital admissions, Medicare status & age, anemia, COPD, not identifying is being in relationship.  Recent hospital admissions were associated with diagnostics and surgery.   Anemia-history of iron deficiency anemia and currently receiving IV Venofer.  She has been feeling better with more energy since starting Venofer.  We discussed that Dr. Tasia Catchings plans for her to receive for infusions of Venofer and we will continue to monitor her blood counts throughout her treatment.   COPD/Emphysema-patient denies history of COPD and emphysema.  Imaging in July 2020 showed evidence of centrilobular emphysema and history of lung nodules which were new since January 2017 imaging.  We discussed that Dr. Tasia Catchings will be reimaging her in the future and will include CT of the chest to reevaluate these nodules.  Not in a relationship-patient is divorced and currently lives alone.  We discussed social support and opportunities for social connections.  We discussed that socialization is limited during covid-19 pandemic however, hopefully in the future the support groups  and mentorship programs through the cancer center will re-start and I would encourage her to participate in those. Please notify nurse navigator to enroll.  She identifies good social support in friends, neighbors, sister, and 2 sons and denies needs today.  3. Social Determinants of Health- we discussed that social determinants of health may have significant impacts on health and outcomes for cancer patients.  Today we discussed specific social determinants of performance status, alcohol use, depression, financial needs, food insecurity, housing, interpersonal violence, social connections, stress, tobacco use, and transportation.  After lengthy discussion the following were identified as areas of need: anxiety/impaired coping, stress, and tobacco use.  - Based on anxiety & impaired coping: we discussed self-referral to [facility] for counseling services or palliative care/symptom management as well as primary care providers. She will consider and contact clinic if interested.   - Based on concern of stress-we discussed options for managing stress including healthy eating, exercise as well as participating in no charge counseling services at the cancer center and support groups.  During COVID-19 I encouraged her to continue to be physically active at home as tolerated as many group exercise classes are currently on hold.    - Based on tobacco use-She says she is not interested in quitting smoking and enjoys smoking.  She says that she smokes more when stressed which we discussed was not uncommon. Smoking cessation was encouraged.  We discussed options for management including medications and referral to quit Smart program if she becomes interested.   4. Co-morbidities Complicating Care:  Weight loss-has lost approximately 10 to 15 pounds since time of diagnosis with history of  celiac disease.  Will refer to dietitian for discussion of food choices and monitoring of weights.  Provided with samples of  Ensure.   5. Palliative Care- based on stage of cancer and identified needs today including her request for advanced care planning, I will refer patient to palliative care for goals of care, advanced care planning, and ongoing symptom management.   We also discussed the role of the Symptom Management Clinic at [facility] for acute issues and methods of contacting clinic/provider. She denies needing specific assistance at this time and She will be continued to be followed by Mariea Clonts, RN (Nurse Navigator).   Follow-up with Dr. Tasia Catchings as scheduled.   Visit Diagnosis 1. Cancer of right colon [facility])    Patient expressed understanding and was in agreement with this plan. She also understands that She can call clinic at any time with any questions, concerns, or complaints.   A total of (25) minutes of face-to-face time was spent with this patient with greater than 50% of that time in counseling and care-coordination.  Beckey Rutter, DNP, AGNP-C [facility] (work cell) [phone] (office)  CC: Dr. Tasia Catchings, Jennet Maduro, [place], Billey Chang, NP

## 2018-11-19 ENCOUNTER — Other Ambulatory Visit: Payer: Self-pay

## 2018-11-20 ENCOUNTER — Inpatient Hospital Stay (HOSPITAL_BASED_OUTPATIENT_CLINIC_OR_DEPARTMENT_OTHER): Payer: Medicare HMO | Admitting: Hospice and Palliative Medicine

## 2018-11-20 ENCOUNTER — Inpatient Hospital Stay: Payer: Medicare HMO

## 2018-11-20 ENCOUNTER — Inpatient Hospital Stay: Payer: Medicare HMO | Admitting: Oncology

## 2018-11-20 ENCOUNTER — Other Ambulatory Visit: Payer: Self-pay

## 2018-11-20 VITALS — BP 123/71 | HR 84 | Temp 99.0°F | Resp 18

## 2018-11-20 DIAGNOSIS — K219 Gastro-esophageal reflux disease without esophagitis: Secondary | ICD-10-CM | POA: Diagnosis not present

## 2018-11-20 DIAGNOSIS — J439 Emphysema, unspecified: Secondary | ICD-10-CM | POA: Diagnosis not present

## 2018-11-20 DIAGNOSIS — R634 Abnormal weight loss: Secondary | ICD-10-CM | POA: Diagnosis not present

## 2018-11-20 DIAGNOSIS — D500 Iron deficiency anemia secondary to blood loss (chronic): Secondary | ICD-10-CM

## 2018-11-20 DIAGNOSIS — C182 Malignant neoplasm of ascending colon: Secondary | ICD-10-CM | POA: Diagnosis not present

## 2018-11-20 DIAGNOSIS — J449 Chronic obstructive pulmonary disease, unspecified: Secondary | ICD-10-CM | POA: Diagnosis not present

## 2018-11-20 DIAGNOSIS — Z515 Encounter for palliative care: Secondary | ICD-10-CM

## 2018-11-20 DIAGNOSIS — Z7189 Other specified counseling: Secondary | ICD-10-CM

## 2018-11-20 DIAGNOSIS — R69 Illness, unspecified: Secondary | ICD-10-CM | POA: Diagnosis not present

## 2018-11-20 DIAGNOSIS — D509 Iron deficiency anemia, unspecified: Secondary | ICD-10-CM | POA: Diagnosis not present

## 2018-11-20 DIAGNOSIS — Z79899 Other long term (current) drug therapy: Secondary | ICD-10-CM | POA: Diagnosis not present

## 2018-11-20 MED ORDER — SODIUM CHLORIDE 0.9 % IV SOLN
Freq: Once | INTRAVENOUS | Status: AC
  Administered 2018-11-20: 14:00:00 via INTRAVENOUS
  Filled 2018-11-20: qty 250

## 2018-11-20 MED ORDER — IRON SUCROSE 20 MG/ML IV SOLN
200.0000 mg | Freq: Once | INTRAVENOUS | Status: AC
  Administered 2018-11-20: 14:00:00 200 mg via INTRAVENOUS
  Filled 2018-11-20: qty 10

## 2018-11-20 NOTE — Progress Notes (Signed)
[place]  [phone] [phone]   Name: Mary Beltran Date: 11/20/2018 MRN: [id number]  DOB: June 21, 1940  Patient Care Team: [name], MD as PCP - General (Internal Medicine) Clent Jacks, RN as Oncology Nurse Navigator    REASON FOR CONSULTATION: Palliative Care consult requested for this 78 y.o. 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  PMH also notable for lung nodules and IDA requiring IV iron.  Patient initially declined adjuvant chemotherapy and radiation but then changed her mind and now wishes to proceed.  She was referred to palliative care for clarification of goals and to manage ongoing symptoms.   SOCIAL HISTORY:     reports that she has been smoking cigarettes. She has a 15.00 pack-year smoking history. She has never used smokeless tobacco. She reports previous drug use. She reports that she does not drink alcohol.   Patient is not married.  She lives at home alone in a townhouse.  She has 2 sons, 1 of whom lives in [place] and the other in [place].  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  ADVANCE DIRECTIVES:  Has previously completed but cannot locate  CODE STATUS:   PAST MEDICAL HISTORY: Past Medical History:  Diagnosis Date  . Complication of anesthesia    NOT WITH GB  . GERD (gastroesophageal reflux disease)   . IDA (iron deficiency anemia) 10/13/2018  . PONV (postoperative nausea and vomiting)     PAST SURGICAL HISTORY:  Past Surgical History:  Procedure Laterality Date  . ABDOMINAL HYSTERECTOMY     complete  . APPENDECTOMY    . CHOLECYSTECTOMY N/A 08/22/2018   Procedure: LAPAROSCOPIC CHOLECYSTECTOMY;  Surgeon: Herbert Pun, MD;  Location: [facility] ORS;  Service: General;  Laterality: N/A;  . COLONOSCOPY    . LAPAROSCOPIC RIGHT COLECTOMY N/A 10/23/2018   Procedure: LAPAROSCOPIC HAND ASSISTED RIGHT COLECTOMY;  Surgeon: Herbert Pun, MD;  Location: [facility] ORS;  Service: General;  Laterality: N/A;  . PERCUTANEOUS PINNING WRIST FRACTURE Right   . WRIST FOREIGN BODY REMOVAL Right    2016    HEMATOLOGY/ONCOLOGY HISTORY:  Oncology History  Cancer of right colon ([place])  10/23/2018 Initial Diagnosis   Cancer of right colon ([place])   10/31/2018 Cancer Staging   Staging form: Colon and Rectum, AJCC 8th Edition - Pathologic stage from 10/31/2018: Stage IIIB (pT4a, pN1c, cM0) - Signed by [name], MD on 10/31/2018   12/17/2018 -  Chemotherapy   The patient had leucovorin 692 mg in dextrose 5 % 250 mL infusion, 400 mg/m2, Intravenous,  Once, 0 of 12 cycles fluorouracil (ADRUCIL) chemo injection 700 mg, 400 mg/m2, Intravenous,  Once, 0 of 12 cycles fluorouracil (ADRUCIL) 4,150 mg in sodium chloride 0.9 % 67 mL chemo infusion, 2,400 mg/m2, Intravenous, 1 Day/Dose, 0 of 12 cycles  for chemotherapy treatment.      ALLERGIES:  is allergic to pain & fever [acetaminophen]; sulfa antibiotics; codeine; and penicillins.  MEDICATIONS:  Current Outpatient Medications  Medication Sig Dispense Refill  . gabapentin (NEURONTIN) 300 MG capsule Take 1 capsule (300 mg total) by mouth 2 (two) times daily. (Patient not taking: Reported on 11/17/2018) 30 capsule 0  . lidocaine-prilocaine (EMLA) cream Apply to affected area once (Patient not taking: Reported on 11/17/2018) 30 g 3  . ondansetron (ZOFRAN) 8 MG tablet Take 1 tablet (8 mg total) by mouth 2 (two) times daily as needed (Nausea or vomiting). (  Patient not taking: Reported on 11/17/2018) 30 tablet 1  . prochlorperazine (COMPAZINE) 10 MG tablet Take 1 tablet (10 mg total) by mouth every 6 (six) hours as needed (Nausea or vomiting). (Patient not taking: Reported on 11/17/2018) 30 tablet 1   No current facility-administered medications for this visit.     VITAL SIGNS: There were no vitals taken for this visit.  There were no vitals filed for this visit.  Estimated body mass index is 21.88 kg/m as calculated from the following:   Height as of 10/23/18: 5' 7"  (1.702 m).   Weight as of 10/30/18: 139 lb 11.2 oz (63.4 kg).  LABS: CBC:    Component Value Date/Time   WBC 8.6 10/26/2018 0504   HGB 8.0 (L) 10/30/2018 1031   HCT 26.2 (L) 10/30/2018 1031   PLT 328 10/26/2018 0504   MCV 81.4 10/26/2018 0504   NEUTROABS 5.8 10/13/2018 1154   LYMPHSABS 1.7 10/13/2018 1154   MONOABS 0.6 10/13/2018 1154   EOSABS 0.1 10/13/2018 1154   BASOSABS 0.1 10/13/2018 1154   Comprehensive Metabolic Panel:    Component Value Date/Time   NA 136 10/26/2018 0504   K 3.9 10/26/2018 0504   CL 110 10/26/2018 0504   CO2 22 10/26/2018 0504   BUN 11 10/26/2018 0504   CREATININE 0.83 10/26/2018 0504   GLUCOSE 106 (H) 10/26/2018 0504   CALCIUM 8.0 (L) 10/26/2018 0504   AST 18 10/13/2018 1154   ALT 11 10/13/2018 1154   ALKPHOS 78 10/13/2018 1154   BILITOT 0.5 10/13/2018 1154   PROT 7.4 10/13/2018 1154   ALBUMIN 4.0 10/13/2018 1154    RADIOGRAPHIC STUDIES: No results found.  PERFORMANCE STATUS (ECOG) : 1 - Symptomatic but completely ambulatory  Review of Systems Unless otherwise noted, a complete review of systems is negative.  Physical Exam General: NAD, frail appearing, thin Pulmonary: unlabored Extremities: no edema, no joint deformities Skin: no rashes Neurological: Weakness but otherwise nonfocal  IMPRESSION: I met with patient today in the infusion area.  Introduced palliative care services and attempted to establish therapeutic rapport.  Patient reports that she is doing reasonably well.  She denies any distressing symptoms.  She says functionally she is completely independent with all of her self-care.  She still drives and manages her home, where she lives independently.  Patient feels she is coping well with her illness so far.  She is worried about the financial impact of starting chemotherapy.   We discussed resources available in the cancer center and I encouraged her to speak with Barnabas Lister, SW if needed.  Together, we reviewed ACP documents.  She has previously completed a living will but can no longer locate the documents.  She is interested in re-completing those and I sent her home with documents today.  We also reviewed a MOST Form, which she took home with her to think about prior to completing.  Case discussed with RD.  PLAN: -Continue current scope of treatment -Referral to [place], SW -Increase oral supplements TID -ACP documents reviewed -MOST form reviewed -RTC in 3-4 weeks   Patient expressed understanding and was in agreement with this plan. She also understands that She can call the clinic at any time with any questions, concerns, or complaints.     Time Total: 20 minutes  Visit consisted of counseling and education dealing with the complex and emotionally intense issues of symptom management and palliative care in the setting of serious and potentially life-threatening illness.Greater than 50%  of this time was  spent counseling and coordinating care related to the above assessment and plan.  Signed by: Altha Harm, PhD, NP-C [phone] (Work Cell)

## 2018-11-20 NOTE — Progress Notes (Signed)
Nutrition Assessment:  Referral from [place], NP for weight loss, new diagnosis of colon cancer. Starting chemotherapy.   78 year old female with new diagnosis of Stage III adenocarcinoma of the colon s/p right hemicolectomy with lung nodules.  Past medical history of Fe deficiency anemia, smoker, COPD, GERD, celiac disease (but patient thinks test was wrong), s/p lap cholecsytectomy 09/2018 and hemicolectomy 7/16.    Spoke with patient following venofer infusion.  Patient reports that she has a good appetite.  Has been trying to lose weight all her life.  Reports that weight has been decreasing since cholecystectomy.  Reports that she typically eats cereal or oatmeal cookie for breakfast.  Has never been a breakfast eater.  Reports that lunch is usually a sandwich and dinner is meat and vegetables.  Lives alone, cooks and prepares meals.  Reports that she has tried ensure/boost does not really like them but can drink them.    Reports has taken a laxative mostly all her life.  However since having gallbladder removed has had normal, regular bowel movements typically every am.     Medications: zofran, compazine  Labs: glucose 106, Hgb 8.0  Anthropometrics:   Height: 67 inches  Weight: 139 lb 11.2 oz UBW: 140-150s per patient.   Noted weight on 5/15 of 136 lb BMI: 21   Estimated Energy Needs  Kcals: 1900-2200 calories Protein: 95-110 g Fluid: 2.2 L  NUTRITION DIAGNOSIS: Increased nutrient needs related to cancer as evidenced by estimated energy needs    INTERVENTION:  Discussed importance of good nutrition during treatment and importance of weight maintenance.  Reviewed foods high in protein and provided examples of those foods.  Contact information provided.     MONITORING, EVALUATION, GOAL: Patient will consume adequate calories and protein to maintain weight during treatment   NEXT VISIT: phone f/u Sept 3rd  Fawne Hughley B. Zenia Resides, [place], [place] Registered Dietitian [phone]  (pager)

## 2018-11-28 ENCOUNTER — Ambulatory Visit: Payer: Self-pay | Admitting: General Surgery

## 2018-11-28 NOTE — H&P (View-Only) (Signed)
HISTORY OF PRESENT ILLNESS:    [name] is a 78 y.o.female patient who comes for follow up after right colectomy.  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  She tolerated the procedure well.  She has been recovering properly.  At home she is having regular bowel movements.  She is tolerating diet.  Pain is controlled.  Patient was evaluated by oncologist.  Oncology recommended to have chemotherapy and to discuss radiation therapy with radiation oncologist.       PAST MEDICAL HISTORY:      Past Medical History:  Diagnosis Date  . Cramps, muscle, general 04/23/2017   Better with mag        PAST SURGICAL HISTORY:        Past Surgical History:  Procedure Laterality Date  . APPENDECTOMY    . CHOLECYSTECTOMY  08/22/2018   DR Lesli Albee  . COLONOSCOPY  10/06/2018   Invasive Adenocarcinoma, moderately differentiated/Repeat 38yrTKT  . EGD  10/06/2018   Chronic inflammation/No Repeat/TKT         MEDICATIONS:  Encounter Medications        Outpatient Encounter Medications as of 11/06/2018  Medication Sig Dispense Refill  . gabapentin (NEURONTIN) 300 MG capsule Take 300 mg by mouth 2 (two) times daily       . nabumetone (RELAFEN) 500 MG tablet Take 500 mg by mouth once daily       . iron polysaccharides (FERREX) 150 mg iron capsule Take 1 capsule (150 mg total) by mouth once daily (Patient not taking: Reported on 10/14/2018  ) 30 capsule 11   No facility-administered encounter medications on file as of 11/06/2018.        ALLERGIES:   Acetaminophen; Neomycin-bacitracin-polymyxin; Codeine; Penicillins; and Sulfa (sulfonamide antibiotics)   SOCIAL HISTORY:  Social History          Socioeconomic History  . Marital status: Divorced    Spouse name: Not on file  . Number of children: Not on file  . Years of education: Not on file  . Highest education level: Not on file  Occupational History  . Not on file  Social  Needs  . Financial resource strain: Not on file  . Food insecurity:    Worry: Not on file    Inability: Not on file  . Transportation needs:    Medical: Not on file    Non-medical: Not on file  Tobacco Use  . Smoking status: Current Every Day Smoker    Years: 60.00    Types: Cigarettes  . Smokeless tobacco: Never Used  Substance and Sexual Activity  . Alcohol use: No  . Drug use: No  . Sexual activity: Not on file  Other Topics Concern  . Not on file  Social History Narrative  . Not on file      FAMILY HISTORY:       Family History  Problem Relation Age of Onset  . Glaucoma Mother   . Ulcers Father   . Cancer Sister   . Thyroid disease Sister   . Goiter Sister   . Heart disease Paternal Grandfather      PHYSICAL EXAM:   Ht:170.2 cm (5' 7" ) Wt:59.9 kg (132 lb) BCZY:SAYTsurface area is 1.68 meters squared. Body mass index is 20.67 kg/m..Marland Kitchen  GENERAL: Alert, active, oriented x3  ABDOMEN: Soft and depressible, nontender with no palpable mass, no hepatomegaly. Wounds dry and clean.  EXTREMITIES: Well-developed well-nourished symmetrical with no dependent edema.  NEUROLOGICAL: Awake  alert oriented, facial expression symmetrical, moving all extremities.      IMPRESSION:     Malignant neoplasm of ascending colon (CMS-HCC) [C18.2] -Wound well-healed, no sign of infection. -Patient recovering well. -Patient was recommended to have chemotherapy and radiation therapy.  Discussed with patient about chemoport.     PLAN:  1.  Insertion of Port a Cath  Patient verbalized understanding, all questions were answered, and were agreeable with the plan outlined above.   Herbert Pun, MD  Electronically signed by Herbert Pun, MD

## 2018-11-28 NOTE — H&P (Signed)
HISTORY OF PRESENT ILLNESS:    [name] is a 78 y.o.female patient who comes for follow up after right colectomy.  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  She tolerated the procedure well.  She has been recovering properly.  At home she is having regular bowel movements.  She is tolerating diet.  Pain is controlled.  Patient was evaluated by oncologist.  Oncology recommended to have chemotherapy and to discuss radiation therapy with radiation oncologist.       PAST MEDICAL HISTORY:      Past Medical History:  Diagnosis Date  . Cramps, muscle, general 04/23/2017   Better with mag        PAST SURGICAL HISTORY:        Past Surgical History:  Procedure Laterality Date  . APPENDECTOMY    . CHOLECYSTECTOMY  08/22/2018   DR Lesli Albee  . COLONOSCOPY  10/06/2018   Invasive Adenocarcinoma, moderately differentiated/Repeat 41yrTKT  . EGD  10/06/2018   Chronic inflammation/No Repeat/TKT         MEDICATIONS:  Encounter Medications        Outpatient Encounter Medications as of 11/06/2018  Medication Sig Dispense Refill  . gabapentin (NEURONTIN) 300 MG capsule Take 300 mg by mouth 2 (two) times daily       . nabumetone (RELAFEN) 500 MG tablet Take 500 mg by mouth once daily       . iron polysaccharides (FERREX) 150 mg iron capsule Take 1 capsule (150 mg total) by mouth once daily (Patient not taking: Reported on 10/14/2018  ) 30 capsule 11   No facility-administered encounter medications on file as of 11/06/2018.        ALLERGIES:   Acetaminophen; Neomycin-bacitracin-polymyxin; Codeine; Penicillins; and Sulfa (sulfonamide antibiotics)   SOCIAL HISTORY:  Social History          Socioeconomic History  . Marital status: Divorced    Spouse name: Not on file  . Number of children: Not on file  . Years of education: Not on file  . Highest education level: Not on file  Occupational History  . Not on file  Social  Needs  . Financial resource strain: Not on file  . Food insecurity:    Worry: Not on file    Inability: Not on file  . Transportation needs:    Medical: Not on file    Non-medical: Not on file  Tobacco Use  . Smoking status: Current Every Day Smoker    Years: 60.00    Types: Cigarettes  . Smokeless tobacco: Never Used  Substance and Sexual Activity  . Alcohol use: No  . Drug use: No  . Sexual activity: Not on file  Other Topics Concern  . Not on file  Social History Narrative  . Not on file      FAMILY HISTORY:       Family History  Problem Relation Age of Onset  . Glaucoma Mother   . Ulcers Father   . Cancer Sister   . Thyroid disease Sister   . Goiter Sister   . Heart disease Paternal Grandfather      PHYSICAL EXAM:   Ht:170.2 cm (5' 7" ) Wt:59.9 kg (132 lb) BWUX:LKGMsurface area is 1.68 meters squared. Body mass index is 20.67 kg/m..Marland Kitchen  GENERAL: Alert, active, oriented x3  ABDOMEN: Soft and depressible, nontender with no palpable mass, no hepatomegaly. Wounds dry and clean.  EXTREMITIES: Well-developed well-nourished symmetrical with no dependent edema.  NEUROLOGICAL: Awake  alert oriented, facial expression symmetrical, moving all extremities.      IMPRESSION:     Malignant neoplasm of ascending colon (CMS-HCC) [C18.2] -Wound well-healed, no sign of infection. -Patient recovering well. -Patient was recommended to have chemotherapy and radiation therapy.  Discussed with patient about chemoport.     PLAN:  1.  Insertion of Port a Cath  Patient verbalized understanding, all questions were answered, and were agreeable with the plan outlined above.   Herbert Pun, MD  Electronically signed by Herbert Pun, MD

## 2018-12-01 ENCOUNTER — Encounter
Admission: RE | Admit: 2018-12-01 | Discharge: 2018-12-01 | Disposition: A | Discharge status: Home / Self Care | Payer: Medicare HMO | Source: Ambulatory Visit | Attending: General Surgery | Admitting: General Surgery

## 2018-12-01 ENCOUNTER — Other Ambulatory Visit: Payer: Self-pay

## 2018-12-01 NOTE — Patient Instructions (Signed)
Your procedure is scheduled on: Monday 12/08/18.  Report to DAY SURGERY DEPARTMENT LOCATED ON 2ND FLOOR MEDICAL MALL ENTRANCE. To find out your arrival time please call [phone] between 1PM - 3PM on Friday 12/01/18.  Remember: Instructions that are not followed completely may result in serious medical risk, up to and including death, or upon the discretion of your surgeon and anesthesiologist your surgery may need to be rescheduled.      _X__ 1. Do not eat food after midnight the night before your procedure.                 No gum chewing or hard candies. You may drink clear liquids up to 2 hours                 before you are scheduled to arrive for your surgery- DO NOT drink clear                 liquids within 2 hours of the start of your surgery.                 Clear Liquids include:  water, apple juice without pulp, clear carbohydrate                 drink such as Clearfast or Gatorade, Black Coffee or Tea (Do not add      Milk or creamerto coffee or tea).  __X__2.  On the morning of surgery brush your teeth with toothpaste and water, you may rinse your mouth with mouthwash if you wish.  Do not swallow any toothpaste or mouthwash.       _X__ 3.  Do Not Smoke or use e-cigarettes For 24 Hours Prior to Your Surgery.                 Do not use any chewable tobacco products for at least 6 hours prior to                 surgery.    __X__4.  Notify your doctor if there is any change in your medical condition      (cold, fever, infections).      Do not wear jewelry, make-up, hairpins, clips or nail polish. Do not wear lotions, powders, or perfumes.  Do not shave 48 hours prior to surgery. Men may shave face and neck. Do not bring valuables to the hospital.     [facility] is not responsible for any belongings or valuables.   Contacts, dentures/partials or body piercings may not be worn into surgery. Bring a case for your contacts, glasses or hearing aids, a denture cup will  be supplied.    Patients discharged the day of surgery will not be allowed to drive home.    __X__ Take these medicines the morning of surgery with A SIP OF WATER:    1. You may take Tylenol if needed.     __X__ Use CHG Soap/SAGE wipes as directed    __X__ Stop Anti-inflammatories 7 days before surgery such as Advil, Ibuprofen, Motrin, BC or Goodies Powder, Naprosyn, Naproxen, Aleve, Aspirin, Meloxicam. May take Tylenol if needed for pain or discomfort.    __X__ Please do not begin taking any new herbal supplements before your surgery.

## 2018-12-04 ENCOUNTER — Other Ambulatory Visit: Payer: Self-pay

## 2018-12-04 ENCOUNTER — Other Ambulatory Visit
Admission: RE | Admit: 2018-12-04 | Discharge: 2018-12-04 | Disposition: A | Discharge status: Home / Self Care | Payer: Medicare HMO | Source: Ambulatory Visit | Attending: General Surgery | Admitting: General Surgery

## 2018-12-04 DIAGNOSIS — Z01812 Encounter for preprocedural laboratory examination: Secondary | ICD-10-CM | POA: Insufficient documentation

## 2018-12-04 DIAGNOSIS — Z20828 Contact with and (suspected) exposure to other viral communicable diseases: Secondary | ICD-10-CM | POA: Diagnosis not present

## 2018-12-04 LAB — SARS CORONAVIRUS 2 (TAT 6-24 HRS): SARS Coronavirus 2: NEGATIVE

## 2018-12-08 ENCOUNTER — Ambulatory Visit: Payer: Medicare HMO | Admitting: Anesthesiology

## 2018-12-08 ENCOUNTER — Ambulatory Visit: Payer: Medicare HMO

## 2018-12-08 ENCOUNTER — Ambulatory Visit
Admission: RE | Admit: 2018-12-08 | Discharge: 2018-12-08 | Disposition: A | Discharge status: Home / Self Care | Payer: Medicare HMO | Attending: General Surgery | Admitting: General Surgery

## 2018-12-08 ENCOUNTER — Other Ambulatory Visit: Payer: Self-pay

## 2018-12-08 ENCOUNTER — Encounter: Payer: Self-pay | Admitting: *Deleted

## 2018-12-08 ENCOUNTER — Encounter
Admission: RE | Disposition: A | Discharge status: Home / Self Care | Payer: Self-pay | Source: Home / Self Care | Attending: General Surgery

## 2018-12-08 DIAGNOSIS — J439 Emphysema, unspecified: Secondary | ICD-10-CM | POA: Diagnosis not present

## 2018-12-08 DIAGNOSIS — Z882 Allergy status to sulfonamides status: Secondary | ICD-10-CM | POA: Insufficient documentation

## 2018-12-08 DIAGNOSIS — Z791 Long term (current) use of non-steroidal anti-inflammatories (NSAID): Secondary | ICD-10-CM | POA: Diagnosis not present

## 2018-12-08 DIAGNOSIS — F17210 Nicotine dependence, cigarettes, uncomplicated: Secondary | ICD-10-CM | POA: Insufficient documentation

## 2018-12-08 DIAGNOSIS — D509 Iron deficiency anemia, unspecified: Secondary | ICD-10-CM | POA: Diagnosis not present

## 2018-12-08 DIAGNOSIS — Z79899 Other long term (current) drug therapy: Secondary | ICD-10-CM | POA: Diagnosis not present

## 2018-12-08 DIAGNOSIS — Z885 Allergy status to narcotic agent status: Secondary | ICD-10-CM | POA: Insufficient documentation

## 2018-12-08 DIAGNOSIS — Z880 Allergy status to penicillin: Secondary | ICD-10-CM | POA: Diagnosis not present

## 2018-12-08 DIAGNOSIS — C182 Malignant neoplasm of ascending colon: Secondary | ICD-10-CM | POA: Insufficient documentation

## 2018-12-08 DIAGNOSIS — Z95828 Presence of other vascular implants and grafts: Secondary | ICD-10-CM

## 2018-12-08 DIAGNOSIS — Z9049 Acquired absence of other specified parts of digestive tract: Secondary | ICD-10-CM | POA: Diagnosis not present

## 2018-12-08 DIAGNOSIS — R69 Illness, unspecified: Secondary | ICD-10-CM | POA: Diagnosis not present

## 2018-12-08 DIAGNOSIS — C180 Malignant neoplasm of cecum: Secondary | ICD-10-CM | POA: Insufficient documentation

## 2018-12-08 DIAGNOSIS — Z452 Encounter for adjustment and management of vascular access device: Secondary | ICD-10-CM | POA: Diagnosis not present

## 2018-12-08 DIAGNOSIS — K219 Gastro-esophageal reflux disease without esophagitis: Secondary | ICD-10-CM | POA: Diagnosis not present

## 2018-12-08 HISTORY — PX: PORTACATH PLACEMENT: SHX2246

## 2018-12-08 SURGERY — INSERTION, TUNNELED CENTRAL VENOUS DEVICE, WITH PORT
Anesthesia: Monitor Anesthesia Care | Site: Chest | Laterality: Right

## 2018-12-08 MED ORDER — SODIUM CHLORIDE (PF) 0.9 % IJ SOLN
INTRAMUSCULAR | Status: AC
  Filled 2018-12-08: qty 50

## 2018-12-08 MED ORDER — ONDANSETRON HCL 4 MG/2ML IJ SOLN: 4.0000 mg | Freq: Once | INTRAMUSCULAR | Status: DC | PRN

## 2018-12-08 MED ORDER — PROPOFOL 10 MG/ML IV BOLUS
INTRAVENOUS | Status: AC
  Filled 2018-12-08: qty 20

## 2018-12-08 MED ORDER — SUCCINYLCHOLINE CHLORIDE 20 MG/ML IJ SOLN
INTRAMUSCULAR | Status: AC
  Filled 2018-12-08: qty 1

## 2018-12-08 MED ORDER — MIDAZOLAM HCL 2 MG/2ML IJ SOLN
INTRAMUSCULAR | Status: AC
  Filled 2018-12-08: qty 2

## 2018-12-08 MED ORDER — GABAPENTIN 300 MG PO CAPS
ORAL_CAPSULE | ORAL | Status: AC
  Administered 2018-12-08: 300 mg
  Filled 2018-12-08: qty 1

## 2018-12-08 MED ORDER — LACTATED RINGERS IV SOLN
INTRAVENOUS | Status: DC
  Administered 2018-12-08: 07:00:00 via INTRAVENOUS

## 2018-12-08 MED ORDER — ROCURONIUM BROMIDE 50 MG/5ML IV SOLN
INTRAVENOUS | Status: AC
  Filled 2018-12-08: qty 1

## 2018-12-08 MED ORDER — HEPARIN SODIUM (PORCINE) 5000 UNIT/ML IJ SOLN
INTRAMUSCULAR | Status: AC
  Filled 2018-12-08: qty 1

## 2018-12-08 MED ORDER — BUPIVACAINE-EPINEPHRINE (PF) 0.25% -1:200000 IJ SOLN
INTRAMUSCULAR | Status: AC
  Filled 2018-12-08: qty 30

## 2018-12-08 MED ORDER — FENTANYL CITRATE (PF) 100 MCG/2ML IJ SOLN: 25.0000 ug | INTRAMUSCULAR | Status: DC | PRN

## 2018-12-08 MED ORDER — FAMOTIDINE 20 MG PO TABS
ORAL_TABLET | ORAL | Status: AC
  Administered 2018-12-08: 20 mg via ORAL
  Filled 2018-12-08: qty 1

## 2018-12-08 MED ORDER — EPHEDRINE SULFATE 50 MG/ML IJ SOLN
INTRAMUSCULAR | Status: AC
  Filled 2018-12-08: qty 1

## 2018-12-08 MED ORDER — FENTANYL CITRATE (PF) 100 MCG/2ML IJ SOLN
INTRAMUSCULAR | Status: DC | PRN
  Administered 2018-12-08: 50 ug via INTRAVENOUS

## 2018-12-08 MED ORDER — LIDOCAINE HCL (PF) 2 % IJ SOLN
INTRAMUSCULAR | Status: AC
  Filled 2018-12-08: qty 10

## 2018-12-08 MED ORDER — CLINDAMYCIN PHOSPHATE 900 MG/50ML IV SOLN
INTRAVENOUS | Status: AC
  Filled 2018-12-08: qty 50

## 2018-12-08 MED ORDER — PHENYLEPHRINE HCL (PRESSORS) 10 MG/ML IV SOLN
INTRAVENOUS | Status: DC | PRN
  Administered 2018-12-08: 200 ug via INTRAVENOUS

## 2018-12-08 MED ORDER — FENTANYL CITRATE (PF) 100 MCG/2ML IJ SOLN
INTRAMUSCULAR | Status: AC
  Filled 2018-12-08: qty 2

## 2018-12-08 MED ORDER — SODIUM CHLORIDE 0.9 % IV SOLN
INTRAVENOUS | Status: DC | PRN
  Administered 2018-12-08: 9 mL via INTRAMUSCULAR

## 2018-12-08 MED ORDER — CLINDAMYCIN PHOSPHATE 900 MG/50ML IV SOLN
900.0000 mg | INTRAVENOUS | Status: AC
  Administered 2018-12-08: 08:00:00 900 mg via INTRAVENOUS

## 2018-12-08 MED ORDER — BUPIVACAINE-EPINEPHRINE (PF) 0.25% -1:200000 IJ SOLN
INTRAMUSCULAR | Status: DC | PRN
  Administered 2018-12-08: 10 mL

## 2018-12-08 MED ORDER — DEXAMETHASONE SODIUM PHOSPHATE 10 MG/ML IJ SOLN
INTRAMUSCULAR | Status: AC
  Filled 2018-12-08: qty 1

## 2018-12-08 MED ORDER — SODIUM CHLORIDE 0.9 % IV SOLN
INTRAVENOUS | Status: DC
  Administered 2018-12-08: 07:00:00 via INTRAVENOUS

## 2018-12-08 MED ORDER — FAMOTIDINE 20 MG PO TABS
20.0000 mg | ORAL_TABLET | Freq: Once | ORAL | Status: AC
  Administered 2018-12-08: 07:00:00 20 mg via ORAL

## 2018-12-08 MED ORDER — PROPOFOL 500 MG/50ML IV EMUL
INTRAVENOUS | Status: DC | PRN
  Administered 2018-12-08: 50 ug/kg/min via INTRAVENOUS

## 2018-12-08 MED ORDER — ONDANSETRON HCL 4 MG/2ML IJ SOLN
INTRAMUSCULAR | Status: AC
  Filled 2018-12-08: qty 2

## 2018-12-08 MED ORDER — MIDAZOLAM HCL 2 MG/2ML IJ SOLN
INTRAMUSCULAR | Status: DC | PRN
  Administered 2018-12-08 (×2): 1 mg via INTRAVENOUS

## 2018-12-08 SURGICAL SUPPLY — 31 items
BLADE SURG SZ11 CARB STEEL (BLADE) ×3 IMPLANT
CANISTER SUCT 1200ML W/VALVE (MISCELLANEOUS) ×3 IMPLANT
CHLORAPREP W/TINT 26 (MISCELLANEOUS) ×3 IMPLANT
COVER LIGHT HANDLE STERIS (MISCELLANEOUS) ×6 IMPLANT
COVER WAND RF STERILE (DRAPES) ×3 IMPLANT
DERMABOND ADVANCED (GAUZE/BANDAGES/DRESSINGS) ×2
DERMABOND ADVANCED .7 DNX12 (GAUZE/BANDAGES/DRESSINGS) ×1 IMPLANT
DRAPE C-ARM XRAY 36X54 (DRAPES) ×3 IMPLANT
ELECT REM PT RETURN 9FT ADLT (ELECTROSURGICAL) ×3
ELECTRODE REM PT RTRN 9FT ADLT (ELECTROSURGICAL) ×1 IMPLANT
GLOVE BIO SURGEON STRL SZ 6.5 (GLOVE) ×2 IMPLANT
GLOVE BIO SURGEONS STRL SZ 6.5 (GLOVE) ×1
GLOVE BIOGEL PI IND STRL 6.5 (GLOVE) ×1 IMPLANT
GLOVE BIOGEL PI INDICATOR 6.5 (GLOVE) ×2
GOWN STRL REUS W/ TWL LRG LVL3 (GOWN DISPOSABLE) ×3 IMPLANT
GOWN STRL REUS W/TWL LRG LVL3 (GOWN DISPOSABLE) ×6
KIT PORT POWER 8FR ISP CVUE (Port) ×3 IMPLANT
KIT TURNOVER KIT A (KITS) ×3 IMPLANT
LABEL OR SOLS (LABEL) ×3 IMPLANT
NDL FILTER BLUNT 18X1 1/2 (NEEDLE) ×1 IMPLANT
NEEDLE FILTER BLUNT 18X 1/2SAF (NEEDLE) ×2
NEEDLE FILTER BLUNT 18X1 1/2 (NEEDLE) ×1 IMPLANT
PACK PORT-A-CATH (MISCELLANEOUS) ×3 IMPLANT
SUT MNCRL AB 4-0 PS2 18 (SUTURE) ×3 IMPLANT
SUT PROLENE 2 0 FS (SUTURE) ×2 IMPLANT
SUT VIC AB 2-0 SH 27 (SUTURE) ×2
SUT VIC AB 2-0 SH 27XBRD (SUTURE) ×1 IMPLANT
SUT VIC AB 3-0 SH 27 (SUTURE) ×2
SUT VIC AB 3-0 SH 27X BRD (SUTURE) ×1 IMPLANT
SYR 10ML LL (SYRINGE) ×3 IMPLANT
SYR 3ML LL SCALE MARK (SYRINGE) ×3 IMPLANT

## 2018-12-08 NOTE — Anesthesia Postprocedure Evaluation (Signed)
Anesthesia Post Note  Patient: Mary Beltran  Procedure(s) Performed: INSERTION PORT-A-CATH, CENTRAL VENOUS CATH WITH SUBCUTANEOUS INFUSION PORT (Right Chest)  Patient location during evaluation: PACU Anesthesia Type: MAC Level of consciousness: awake and alert Pain management: pain level controlled Vital Signs Assessment: post-procedure vital signs reviewed and stable Respiratory status: spontaneous breathing, nonlabored ventilation, respiratory function stable and patient connected to nasal cannula oxygen Cardiovascular status: stable and blood pressure returned to baseline Postop Assessment: no apparent nausea or vomiting Anesthetic complications: no     Last Vitals:  Vitals:   12/08/18 0852 12/08/18 0902  BP:  (!) 143/62  Pulse:  62  Resp:  18  Temp: (!) 36.1 C (!) 36.2 C  SpO2:  100%    Last Pain:  Vitals:   12/08/18 0902  TempSrc: Temporal  PainSc: 0-No pain                 Colleene Swarthout S

## 2018-12-08 NOTE — Transfer of Care (Signed)
Immediate Anesthesia Transfer of Care Note  Patient: Mary Beltran  Procedure(s) Performed: INSERTION PORT-A-CATH, CENTRAL VENOUS CATH WITH SUBCUTANEOUS INFUSION PORT (Right Chest)  Patient Location: PACU  Anesthesia Type:MAC  Level of Consciousness: awake  Airway & Oxygen Therapy: Patient Spontanous Breathing  Post-op Assessment: Report given to RN  Post vital signs: Reviewed and stable  Last Vitals:  Vitals Value Taken Time  BP 115/68 12/08/18 0822  Temp    Pulse 73 12/08/18 0824  Resp 13 12/08/18 0824  SpO2 96 % 12/08/18 0824  Vitals shown include unvalidated device data.  Last Pain:  Vitals:   12/08/18 0822  TempSrc:   PainSc: (P) 0-No pain         Complications: No apparent anesthesia complications

## 2018-12-08 NOTE — Anesthesia Preprocedure Evaluation (Addendum)
Anesthesia Evaluation  Patient identified by MRN, date of birth, ID band Patient awake    Reviewed: Allergy & Precautions, NPO status , Patient's Chart, lab work & pertinent test results, reviewed documented beta blocker date and time   Airway Mallampati: II  TM Distance: >3 FB     Dental  (+) Upper Dentures, Lower Dentures   Pulmonary Current Smoker,           Cardiovascular      Neuro/Psych    GI/Hepatic GERD  Controlled,  Endo/Other    Renal/GU      Musculoskeletal   Abdominal   Peds  Hematology  (+) anemia ,   Anesthesia Other Findings Hb 8.0. Rbbb.  Reproductive/Obstetrics                            Anesthesia Physical Anesthesia Plan  ASA: III  Anesthesia Plan: MAC   Post-op Pain Management:    Induction: Intravenous  PONV Risk Score and Plan:   Airway Management Planned:   Additional Equipment:   Intra-op Plan:   Post-operative Plan:   Informed Consent: I have reviewed the patients History and Physical, chart, labs and discussed the procedure including the risks, benefits and alternatives for the proposed anesthesia with the patient or authorized representative who has indicated his/her understanding and acceptance.       Plan Discussed with: CRNA  Anesthesia Plan Comments:         Anesthesia Quick Evaluation

## 2018-12-08 NOTE — Anesthesia Post-op Follow-up Note (Signed)
Anesthesia QCDR form completed.        

## 2018-12-08 NOTE — Op Note (Signed)
SURGICAL PROCEDURE REPORT  DATE OF PROCEDURE: 12/08/2018   SURGEON: Dr. Windell Moment   ANESTHESIA: Local with light IV sedation   PRE-OPERATIVE DIAGNOSIS: Advanced colon cancer requiring durable central venous access for chemotherapy   POST-OPERATIVE DIAGNOSIS: Advanced colon cancer requiring durable central venous access for chemotherapy   PROCEDURE(S):  1.) Percutaneous access of Right internal jugular vein under ultrasound guidance 2.) Insertion of tunneled Right internal jugular central venous catheter with subcutaneous port  INTRAOPERATIVE FINDINGS: Patent easily compressible Right internal jugular vein with appropriate respiratory variations and well-secured tunneled central venous catheter with subcutaneous port at completion of the procedure  ESTIMATED BLOOD LOSS: Minimal (<20 mL)   SPECIMENS: None   IMPLANTS: 27F tunneled Bard PowerPort central venous catheter with subcutaneous port  DRAINS: None   COMPLICATIONS: None apparent   CONDITION AT COMPLETION: Hemodynamically stable, awake   DISPOSITION: PACU   INDICATION(S) FOR PROCEDURE:  Patient is a 78 y.o. female who presented with advanced colon cancer requiring durable central venous access for chemotherapy. All risks, benefits, and alternatives to above elective procedures were discussed with the patient, who elected to proceed, and informed consent was accordingly obtained at that time.  DETAILS OF PROCEDURE:  Patient was brought to the operative suite and appropriately identified. In Trendelenburg position, Right IJ venous access site was prepped and draped in the usual sterile fashion, and following a brief timeout, percutaneous Right IJ venous access was obtained under ultrasound guidance using Seldinger technique, by which local anesthetic was injected over the Right IJ vein, and access needle was inserted under direct ultrasound visualization into the Right IJ vein, through which soft guidewire was advanced, over  which access needle was withdrawn. Guidewire was secured, attention was directed to injection of local anesthetic along the planned tunnel site, 2-3 cm transverse Right chest incision was made and confirmed to accommodate the subcutaneous port, and flushed catheter was tunneled retrograde from the port site over the Right chest to the Right IJ access site with the attached port well-secured to the catheter and within the subcutaneous pocket. Insertion sheath was advanced over the guidewire, which was withdrawn along with the insertion sheath dilator. The catheter was introduced through the sheath and left on the [place] junction under fluoro guidance and catheter cut to desire lenght. Catheter connected to port and fixed to the pocket on two side to avoid twisting. Port was confirmed to withdraw blood and flush easily, after which concentrated heparin was instilled into the port and catheter. Dermis at the subcutaneous pocket was re-approximated using buried interrupted 3-0 Vicryl suture, and 4-0 Monocryl suture was used to re-approximate skin at the insertion and subcutaneous port sites in running subcuticular fashion for the subcutaneous port and buried interrupted fashion for the insertion site. Skin was cleaned, dried, and sterile skin glue was applied. Patient was then safely transferred to PACU for a chest x-ray. Ultrasound images are available on paper chart and Fluoroscopy guidance images are available in Epic.

## 2018-12-08 NOTE — Interval H&P Note (Signed)
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  The various methods of treatment have been discussed with the patient and family. After consideration of risks, benefits and other options for treatment, the patient has consented to  Procedure(s): INSERTION PORT-A-CATH, CENTRAL VENOUS CATH WITH SUBCUTANEOUS INFUSION PORT (N/A) as a surgical intervention.  The patient's history has been reviewed, patient examined, no change in status, stable for surgery.  I have reviewed the patient's chart and labs.  Will attempt to place chemo port on the right side. Right side marked in the pre procedure room. Questions were answered to the patient's satisfaction.     Herbert Pun

## 2018-12-08 NOTE — Discharge Instructions (Signed)
°  Diet: Resume home heart healthy regular diet.   Activity: Increase activity as tolerated. Light activity and walking are encouraged. Do not drive or drink alcohol if taking narcotic pain medications.  Wound care: May shower with soapy water and pat dry (do not rub incisions), but no baths or submerging incision underwater until follow-up. (no swimming)   Medications: Resume all home medications. For mild to moderate pain: acetaminophen (Tylenol) or ibuprofen (if no kidney disease). Combining Tylenol with alcohol can substantially increase your risk of causing liver disease. Narcotic pain medications, if prescribed, can be used for severe pain, though may cause nausea, constipation, and drowsiness. Do not combine Tylenol and Norco within a 6 hour period as Norco contains Tylenol. If you do not need the narcotic pain medication, you do not need to fill the prescription.  Call office [phone]) at any time if any questions, worsening pain, fevers/chills, bleeding, drainage from incision site, or other concerns.  AMBULATORY SURGERY  DISCHARGE INSTRUCTIONS   1) The drugs that you were given will stay in your system until tomorrow so for the next 24 hours you should not:  A) Drive an automobile B) Make any legal decisions C) Drink any alcoholic beverage   2) You may resume regular meals tomorrow.  Today it is better to start with liquids and gradually work up to solid foods.  You may eat anything you prefer, but it is better to start with liquids, then soup and crackers, and gradually work up to solid foods.   3) Please notify your doctor immediately if you have any unusual bleeding, trouble breathing, redness and pain at the surgery site, drainage, fever, or pain not relieved by medication.    4) Additional Instructions:        Please contact your physician with any problems or Same Day Surgery at [phone], Monday through Friday 6 am to 4 pm, or [place] at [facility]  number at [phone].

## 2018-12-11 ENCOUNTER — Other Ambulatory Visit: Payer: Self-pay

## 2018-12-11 ENCOUNTER — Inpatient Hospital Stay: Payer: Medicare HMO | Attending: Oncology

## 2018-12-11 DIAGNOSIS — D509 Iron deficiency anemia, unspecified: Secondary | ICD-10-CM | POA: Insufficient documentation

## 2018-12-11 DIAGNOSIS — C182 Malignant neoplasm of ascending colon: Secondary | ICD-10-CM | POA: Insufficient documentation

## 2018-12-11 DIAGNOSIS — R918 Other nonspecific abnormal finding of lung field: Secondary | ICD-10-CM | POA: Insufficient documentation

## 2018-12-11 DIAGNOSIS — R197 Diarrhea, unspecified: Secondary | ICD-10-CM | POA: Insufficient documentation

## 2018-12-11 DIAGNOSIS — Z5111 Encounter for antineoplastic chemotherapy: Secondary | ICD-10-CM | POA: Insufficient documentation

## 2018-12-11 DIAGNOSIS — F17210 Nicotine dependence, cigarettes, uncomplicated: Secondary | ICD-10-CM | POA: Insufficient documentation

## 2018-12-11 DIAGNOSIS — J439 Emphysema, unspecified: Secondary | ICD-10-CM | POA: Insufficient documentation

## 2018-12-11 DIAGNOSIS — Z66 Do not resuscitate: Secondary | ICD-10-CM | POA: Insufficient documentation

## 2018-12-11 DIAGNOSIS — K219 Gastro-esophageal reflux disease without esophagitis: Secondary | ICD-10-CM | POA: Insufficient documentation

## 2018-12-11 DIAGNOSIS — K5909 Other constipation: Secondary | ICD-10-CM | POA: Insufficient documentation

## 2018-12-11 DIAGNOSIS — R110 Nausea: Secondary | ICD-10-CM | POA: Insufficient documentation

## 2018-12-11 DIAGNOSIS — D125 Benign neoplasm of sigmoid colon: Secondary | ICD-10-CM | POA: Insufficient documentation

## 2018-12-11 DIAGNOSIS — J029 Acute pharyngitis, unspecified: Secondary | ICD-10-CM | POA: Insufficient documentation

## 2018-12-11 DIAGNOSIS — Z803 Family history of malignant neoplasm of breast: Secondary | ICD-10-CM | POA: Insufficient documentation

## 2018-12-11 DIAGNOSIS — Z79899 Other long term (current) drug therapy: Secondary | ICD-10-CM | POA: Insufficient documentation

## 2018-12-11 NOTE — Progress Notes (Signed)
Nutrition Follow-up:  Patient with colon cancer.  Planning to start chemotherapy on 9/9.    Spoke with patient via phone this am for nutrition follow-up.  Patient reports that she had her port placed on 8/31 and that went well except for feeling nauseated after port was placed.  Reports that she is nervous about starting chemotherapy.  She reports that appetite is good.  She has been eating 3 meals per day and snacks in between. Reports that yesterday she ate an egg sandwich, sometimes has cereal.  Reports lunch was chicken pie that she had made.  Supper was soup and sandwich.  She snacks on peanut butter crackers. Reports that her sister and neighbors bring her food to eat as well.      Medications: reviewed  Labs: reviewed  Anthropometrics:   Weight on home scales today 130 lb per patient.   139 lb on 7/23   NUTRITION DIAGNOSIS: Increased nutrient needs continue   INTERVENTION:  Encouraged patient to continue to eat 3 meals per day with at least 2 snacks between meals.  Encouraged patient to choose protein food at every meal.  We discussed foods that have protein in them.  Patient has contact information    MONITORING, EVALUATION, GOAL: Patient will consume adequate calories and protein to maintain weight during treatment   NEXT VISIT: October 1, phone visit  Mary Beltran B. Zenia Resides, [place], [place] Registered Dietitian [phone] (pager)

## 2018-12-16 ENCOUNTER — Other Ambulatory Visit: Payer: Self-pay

## 2018-12-17 ENCOUNTER — Encounter: Payer: Self-pay | Admitting: Oncology

## 2018-12-17 ENCOUNTER — Other Ambulatory Visit: Payer: Self-pay

## 2018-12-17 ENCOUNTER — Inpatient Hospital Stay (HOSPITAL_BASED_OUTPATIENT_CLINIC_OR_DEPARTMENT_OTHER): Payer: Medicare HMO | Admitting: Oncology

## 2018-12-17 ENCOUNTER — Inpatient Hospital Stay: Payer: Medicare HMO | Admitting: Hospice and Palliative Medicine

## 2018-12-17 ENCOUNTER — Inpatient Hospital Stay: Payer: Medicare HMO

## 2018-12-17 VITALS — BP 154/67 | HR 75 | Temp 97.5°F | Resp 16 | Ht 67.0 in | Wt 132.1 lb

## 2018-12-17 DIAGNOSIS — C182 Malignant neoplasm of ascending colon: Secondary | ICD-10-CM | POA: Diagnosis not present

## 2018-12-17 DIAGNOSIS — J029 Acute pharyngitis, unspecified: Secondary | ICD-10-CM | POA: Diagnosis not present

## 2018-12-17 DIAGNOSIS — Z79899 Other long term (current) drug therapy: Secondary | ICD-10-CM | POA: Diagnosis not present

## 2018-12-17 DIAGNOSIS — J439 Emphysema, unspecified: Secondary | ICD-10-CM | POA: Diagnosis not present

## 2018-12-17 DIAGNOSIS — Z803 Family history of malignant neoplasm of breast: Secondary | ICD-10-CM | POA: Diagnosis not present

## 2018-12-17 DIAGNOSIS — R918 Other nonspecific abnormal finding of lung field: Secondary | ICD-10-CM | POA: Diagnosis not present

## 2018-12-17 DIAGNOSIS — Z720 Tobacco use: Secondary | ICD-10-CM | POA: Diagnosis not present

## 2018-12-17 DIAGNOSIS — R110 Nausea: Secondary | ICD-10-CM | POA: Diagnosis not present

## 2018-12-17 DIAGNOSIS — Z7189 Other specified counseling: Secondary | ICD-10-CM | POA: Diagnosis not present

## 2018-12-17 DIAGNOSIS — Z66 Do not resuscitate: Secondary | ICD-10-CM | POA: Diagnosis not present

## 2018-12-17 DIAGNOSIS — F17210 Nicotine dependence, cigarettes, uncomplicated: Secondary | ICD-10-CM | POA: Diagnosis not present

## 2018-12-17 DIAGNOSIS — D509 Iron deficiency anemia, unspecified: Secondary | ICD-10-CM | POA: Diagnosis not present

## 2018-12-17 DIAGNOSIS — Z809 Family history of malignant neoplasm, unspecified: Secondary | ICD-10-CM | POA: Diagnosis not present

## 2018-12-17 DIAGNOSIS — D500 Iron deficiency anemia secondary to blood loss (chronic): Secondary | ICD-10-CM

## 2018-12-17 DIAGNOSIS — K5909 Other constipation: Secondary | ICD-10-CM | POA: Diagnosis not present

## 2018-12-17 DIAGNOSIS — D125 Benign neoplasm of sigmoid colon: Secondary | ICD-10-CM | POA: Diagnosis not present

## 2018-12-17 DIAGNOSIS — K219 Gastro-esophageal reflux disease without esophagitis: Secondary | ICD-10-CM | POA: Diagnosis not present

## 2018-12-17 DIAGNOSIS — R197 Diarrhea, unspecified: Secondary | ICD-10-CM | POA: Diagnosis not present

## 2018-12-17 DIAGNOSIS — Z5111 Encounter for antineoplastic chemotherapy: Secondary | ICD-10-CM | POA: Diagnosis present

## 2018-12-17 LAB — COMPREHENSIVE METABOLIC PANEL
ALT: 27 U/L (ref 0–44)
AST: 35 U/L (ref 15–41)
Albumin: 4.4 g/dL (ref 3.5–5.0)
Alkaline Phosphatase: 80 U/L (ref 38–126)
Anion gap: 10 (ref 5–15)
BUN: 11 mg/dL (ref 8–23)
CO2: 22 mmol/L (ref 22–32)
Calcium: 9.5 mg/dL (ref 8.9–10.3)
Chloride: 103 mmol/L (ref 98–111)
Creatinine, Ser: 0.72 mg/dL (ref 0.44–1.00)
GFR calc Af Amer: >60 mL/min (ref >60)
GFR calc non Af Amer: >60 mL/min (ref >60)
Glucose, Bld: 119 mg/dL — ABNORMAL HIGH (ref 70–99)
Potassium: 3.7 mmol/L (ref 3.5–5.1)
Sodium: 135 mmol/L (ref 135–145)
Total Bilirubin: 0.4 mg/dL (ref 0.3–1.2)
Total Protein: 7 g/dL (ref 6.5–8.1)

## 2018-12-17 LAB — CBC WITH DIFFERENTIAL/PLATELET
Abs Immature Granulocytes: 0.02 10*3/uL (ref 0.00–0.07)
Basophils Absolute: 0.1 10*3/uL (ref 0.0–0.1)
Basophils Relative: 1 %
Eosinophils Absolute: 0.1 10*3/uL (ref 0.0–0.5)
Eosinophils Relative: 2 %
HCT: 38.8 % (ref 36.0–46.0)
Hemoglobin: 12.4 g/dL (ref 12.0–15.0)
Immature Granulocytes: 0 %
Lymphocytes Relative: 20 %
Lymphs Abs: 1.2 10*3/uL (ref 0.7–4.0)
MCH: 27.2 pg (ref 26.0–34.0)
MCHC: 32 g/dL (ref 30.0–36.0)
MCV: 85.1 fL (ref 80.0–100.0)
Monocytes Absolute: 0.5 10*3/uL (ref 0.1–1.0)
Monocytes Relative: 8 %
Neutro Abs: 4.3 10*3/uL (ref 1.7–7.7)
Neutrophils Relative %: 69 %
Platelets: 240 10*3/uL (ref 150–400)
RBC: 4.56 MIL/uL (ref 3.87–5.11)
RDW: 20.8 % — ABNORMAL HIGH (ref 11.5–15.5)
WBC: 6.1 10*3/uL (ref 4.0–10.5)
nRBC: 0 % (ref 0.0–0.2)

## 2018-12-17 MED ORDER — SODIUM CHLORIDE 0.9 % IV SOLN
Freq: Once | INTRAVENOUS | Status: AC
  Administered 2018-12-17: 11:00:00 via INTRAVENOUS
  Filled 2018-12-17: qty 250

## 2018-12-17 MED ORDER — PROCHLORPERAZINE MALEATE 10 MG PO TABS
10.0000 mg | ORAL_TABLET | Freq: Once | ORAL | Status: AC
  Administered 2018-12-17: 12:00:00 10 mg via ORAL
  Filled 2018-12-17: qty 1

## 2018-12-17 MED ORDER — SODIUM CHLORIDE 0.9 % IV SOLN
2400.0000 mg/m2 | INTRAVENOUS | Status: DC
  Administered 2018-12-17: 12:00:00 4150 mg via INTRAVENOUS
  Filled 2018-12-17: qty 83

## 2018-12-17 MED ORDER — SODIUM CHLORIDE 0.9% FLUSH
10.0000 mL | INTRAVENOUS | Status: DC | PRN
  Administered 2018-12-17: 09:00:00 10 mL via INTRAVENOUS
  Filled 2018-12-17: qty 10

## 2018-12-17 MED ORDER — LEUCOVORIN CALCIUM INJECTION 350 MG: 400.0000 mg/m2 | Freq: Once | INTRAVENOUS | Status: DC

## 2018-12-17 MED ORDER — FLUOROURACIL CHEMO INJECTION 2.5 GM/50ML
400.0000 mg/m2 | Freq: Once | INTRAVENOUS | Status: AC
  Administered 2018-12-17: 12:00:00 700 mg via INTRAVENOUS
  Filled 2018-12-17: qty 14

## 2018-12-17 MED ORDER — LEUCOVORIN CALCIUM INJECTION 100 MG
20.0000 mg/m2 | Freq: Once | INTRAMUSCULAR | Status: AC
  Administered 2018-12-17: 12:00:00 34 mg via INTRAVENOUS
  Filled 2018-12-17: qty 1.7

## 2018-12-17 MED ORDER — HEPARIN SOD (PORK) LOCK FLUSH 100 UNIT/ML IV SOLN: 500.0000 [IU] | Freq: Once | INTRAVENOUS | Status: DC

## 2018-12-17 NOTE — Progress Notes (Signed)
Pt nervous about her first day for chemo.

## 2018-12-17 NOTE — Progress Notes (Signed)
Pt tolerated first time Leucovorin injection, Adrucil push, and the initiation of the 5FU pump well. All questions were answer. RN made patient aware who to contact if she has problems with her pump at home and if she develops any complications to call the clinic and if it is an emergency to call 911. Pt verbalizes understanding. VSS upon discharge.   Dionne Rossa [employer]

## 2018-12-17 NOTE — Progress Notes (Signed)
Hematology/Oncology  [facility] Telephone:([phone] Fax:([phone]   Patient Care Team: [name], MD as PCP - General (Internal Medicine) Clent Jacks, RN as Oncology Nurse Navigator  REFERRING PROVIDER: [name], MD  CHIEF COMPLAINTS/REASON FOR VISIT:  Follow-up for management of stage III colon cancer,  iron deficiency, lung nodules HISTORY OF PRESENTING ILLNESS:   Mary Beltran is a  78 y.o.  female with PMH listed below was seen in consultation at the request of  [name], MD  for evaluation of colon cancer Patient had acute cholecystitis and had laparoscopic cholecystectomy same day by Dr. Peyton Najjar. Patient was found to have iron deficiency anemia with hemoglobin around 9.2, ferritin 7 and iron 24. Baseline hemoglobin 1 year ago was 13.1.  Patient was started on iron supplementation with Ferrex 150 mg iron capsule daily. Reports to have chronic constipation.  Denies any blood in the stool. Patient was seen by gastroenterology and had endoscopy and colonoscopy done on 10/06/2018. There was a partially obstructing mass in the cecum which was biopsied. 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 Gastric mucosa shows reactive foveolar hyperplasia, stromal fibrosis and focal chronic inflammation.  Negative for H. pylori.  Duodenal biopsy shows no abnormalities.  No evidence of celiac disease.  Sigmoid polyps showed tubular adenoma. 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 10 pound weight loss within past few months. Denies any family history colon cancer.  Paternal grandmother passed away due to breast cancer. Per patient's request, I called patient's son Mary Beltran and sister Mary Beltran, and put both of them on speaker phone so they can hear the conversation and participate in discussion.  # 10/23/2018, she underwent right hemicolectomy Pathology showed invasive moderately  differentiated adenocarcinoma involving cecum and adjacent terminal ileum with extension into pericecal adipose tissue and focally to inked serosal surface. Invasive moderately differentiated adenocarcinoma involving proximal ascending colon and ileocecal valve with extension into pericolonic adipose tissue. Tubular adenoma, sessile serrated polyp in the cecum, tubular adenoma sessile serrated polyp in the ascending colon.  Grade 2, tumor invades visceral peritoneum, all margins are negative. Pathological staging pT4a pN1c  #Iron deficiency anemia, status post IV Venofer treatments.   INTERVAL HISTORY Mary Beltran is a 77 y.o. female who has above history reviewed by me today presents for follow up visit for management of stage III right colon cancer, iron deficiency, lung nodules #She has agreed to proceed with adjuvant chemotherapy. Denies any new complaints today. Feeling well at baseline .  Denies any fever, chills, nausea vomiting. She reports that she has antiemetics and understands the instructions of how to use it Appetite is fair,    Review of Systems  Constitutional: Negative for appetite change, chills, fatigue and fever.  HENT:   Negative for hearing loss and voice change.   Eyes: Negative for eye problems.  Respiratory: Negative for chest tightness and cough.   Cardiovascular: Negative for chest pain.  Gastrointestinal: Negative for abdominal distention, abdominal pain, blood in stool and constipation.  Endocrine: Negative for hot flashes.  Genitourinary: Negative for difficulty urinating and frequency.   Musculoskeletal: Negative for arthralgias.  Skin: Negative for itching and rash.  Neurological: Negative for extremity weakness.  Hematological: Negative for adenopathy.  Psychiatric/Behavioral: Negative for confusion.    MEDICAL HISTORY:  Past Medical History:  Diagnosis Date  . Complication of anesthesia    NOT WITH GB  . GERD (gastroesophageal reflux disease)    . IDA (iron deficiency anemia) 10/13/2018  .  PONV (postoperative nausea and vomiting)     SURGICAL HISTORY: Past Surgical History:  Procedure Laterality Date  . ABDOMINAL HYSTERECTOMY     complete  . APPENDECTOMY    . CHOLECYSTECTOMY N/A 08/22/2018   Procedure: LAPAROSCOPIC CHOLECYSTECTOMY;  Surgeon: Herbert Pun, MD;  Location: [facility] ORS;  Service: General;  Laterality: N/A;  . COLONOSCOPY    . LAPAROSCOPIC RIGHT COLECTOMY N/A 10/23/2018   Procedure: LAPAROSCOPIC HAND ASSISTED RIGHT COLECTOMY;  Surgeon: Herbert Pun, MD;  Location: [facility] ORS;  Service: General;  Laterality: N/A;  . PERCUTANEOUS PINNING WRIST FRACTURE Right   . PORTACATH PLACEMENT Right 12/08/2018   Procedure: INSERTION PORT-A-CATH, CENTRAL VENOUS CATH WITH SUBCUTANEOUS INFUSION PORT;  Surgeon: Herbert Pun, MD;  Location: [facility] ORS;  Service: General;  Laterality: Right;  . WRIST FOREIGN BODY REMOVAL Right    2016    SOCIAL HISTORY: Social History   Socioeconomic History  . Marital status: Divorced    Spouse name: Not on file  . Number of children: Not on file  . Years of education: Not on file  . Highest education level: Not on file  Occupational History  . Occupation: retired  Scientific laboratory technician  . Financial resource strain: Not on file  . Food insecurity    Worry: Not on file    Inability: Not on file  . Transportation needs    Medical: Not on file    Non-medical: Not on file  Tobacco Use  . Smoking status: Current Every Day Smoker    Packs/day: 0.25    Years: 60.00    Pack years: 15.00    Types: Cigarettes  . Smokeless tobacco: Never Used  . Tobacco comment: Patient states that this is the only thing she enjoys  Substance and Sexual Activity  . Alcohol use: No  . Drug use: Not Currently  . Sexual activity: Not Currently  Lifestyle  . Physical activity    Days per week: Not on file    Minutes per session: Not on file  . Stress: Not on file  Relationships  . Social  Herbalist on phone: Not on file    Gets together: Not on file    Attends religious service: Not on file    Active member of club or organization: Not on file    Attends meetings of clubs or organizations: Not on file    Relationship status: Not on file  . Intimate partner violence    Fear of current or ex partner: Not on file    Emotionally abused: Not on file    Physically abused: Not on file    Forced sexual activity: Not on file  Other Topics Concern  . Not on file  Social History Narrative  . Not on file    FAMILY HISTORY: Family History  Problem Relation Age of Onset  . COPD Mother   . Suicidality Father   . Breast cancer Paternal Grandmother   . Failure to thrive Sister     ALLERGIES:  is allergic to pain & fever [acetaminophen]; sulfa antibiotics; codeine; and penicillins.  MEDICATIONS:  Current Outpatient Medications  Medication Sig Dispense Refill  . lidocaine-prilocaine (EMLA) cream Apply to affected area once 30 g 3  . ondansetron (ZOFRAN) 8 MG tablet Take 1 tablet (8 mg total) by mouth 2 (two) times daily as needed (Nausea or vomiting). 30 tablet 1  . prochlorperazine (COMPAZINE) 10 MG tablet Take 1 tablet (10 mg total) by mouth every 6 (six) hours as  needed (Nausea or vomiting). 30 tablet 1   No current facility-administered medications for this visit.      PHYSICAL EXAMINATION: ECOG PERFORMANCE STATUS: 1 - Symptomatic but completely ambulatory Physical Exam Constitutional:      General: She is not in acute distress.    Comments: Thin, walk in   HENT:     Head: Normocephalic and atraumatic.  Eyes:     General: No scleral icterus.    Pupils: Pupils are equal, round, and reactive to light.  Neck:     Musculoskeletal: Normal range of motion and neck supple.  Cardiovascular:     Rate and Rhythm: Normal rate and regular rhythm.     Heart sounds: Normal heart sounds.  Pulmonary:     Effort: Pulmonary effort is normal. No respiratory distress.      Breath sounds: No wheezing.  Abdominal:     General: Bowel sounds are normal. There is no distension.     Palpations: Abdomen is soft. There is no mass.     Tenderness: There is no abdominal tenderness.  Musculoskeletal: Normal range of motion.        General: No deformity.  Skin:    General: Skin is warm and dry.     Coloration: Skin is not pale.     Findings: No erythema or rash.  Neurological:     Mental Status: She is alert and oriented to person, place, and time.     Cranial Nerves: No cranial nerve deficit.     Coordination: Coordination normal.  Psychiatric:        Behavior: Behavior normal.        Thought Content: Thought content normal.     LABORATORY DATA:  I have reviewed the data as listed Lab Results  Component Value Date   WBC 6.1 12/17/2018   HGB 12.4 12/17/2018   HCT 38.8 12/17/2018   MCV 85.1 12/17/2018   PLT 240 12/17/2018   Recent Labs    10/13/18 1154 10/24/18 0248 10/26/18 0504 12/17/18 0846  NA 135 134* 136 135  K 3.9 3.8 3.9 3.7  CL 102 102 110 103  CO2 21* 22 22 22   GLUCOSE 106* 114* 106* 119*  BUN 9 11 11 11   CREATININE 0.67 0.72 0.83 0.72  CALCIUM 8.9 8.3* 8.0* 9.5  GFRNONAA >60 >60 >60 >60  GFRAA >60 >60 >60 >60  PROT 7.4  --   --  7.0  ALBUMIN 4.0  --   --  4.4  AST 18  --   --  35  ALT 11  --   --  27  ALKPHOS 78  --   --  80  BILITOT 0.5  --   --  0.4   Iron/TIBC/Ferritin/ %Sat    Component Value Date/Time   IRON 20 (L) 10/13/2018 1154   TIBC 547 (H) 10/13/2018 1154   FERRITIN 6 (L) 10/13/2018 1154   IRONPCTSAT 4 (L) 10/13/2018 1154      RADIOGRAPHIC STUDIES: I have personally reviewed the radiological images as listed and agreed with the findings in the report.  Dg Chest Port 1 View  Result Date: 12/08/2018 CLINICAL DATA:  Port placement EXAM: PORTABLE CHEST 1 VIEW COMPARISON:  10/09/2018 chest CT FINDINGS: Right internal jugular Port-A-Cath terminates in the middle third of the SVC. Normal heart size.  Atherosclerotic aortic arch. Otherwise normal mediastinal contour. No pneumothorax. No pleural effusion. No overt pulmonary edema. No acute consolidative airspace disease. Emphysema. IMPRESSION: Right internal jugular Port-A-Cath terminates  in the middle third of the SVC. No pneumothorax. No acute pulmonary disease. Emphysema. Electronically Signed   By: Ilona Sorrel M.D.   On: 12/08/2018 08:45   Dg C-arm 1-60 Min-no Report  Result Date: 12/08/2018 Fluoroscopy was utilized by the requesting physician.  No radiographic interpretation.      ASSESSMENT & PLAN:  1. Cancer of right colon ([place])   2. Goals of care, counseling/discussion   3. Family history of cancer   4. Tobacco abuse   Cancer Staging Cancer of right colon [facility]) Staging form: Colon and Rectum, AJCC 8th Edition - Pathologic stage from 10/31/2018: Stage IIIB (pT4a, pN1c, cM0) - Signed by [name], MD on 10/31/2018   #Stage IIIB right colon adenocarcinoma Labs are reviewed and discussed with patient. Chemotherapy side effects have been discussed with patient previously and discussed with her again today.  She agrees with the plan. Counts acceptable proceed with adjuvant chemotherapy with 5-FU. Plan 5-FU every 2 weeks x 6 cycles.  #Family history of.Cancer, recommend genetic testing and was previously discussed with patient.  She wants to consider it and update me.  #Iron deficiency anemia, status post IV Venofer treatments.  Hemoglobin has returned to baseline.  Hemoglobin 12.4 today. #Small lung nodules, attention on follow-up scans. Wheezing left lower lobe.  Patient is smoker.  Declined inhaler prescription. Smoking cessation was discussed with patient . We spent sufficient time to discuss many aspect of care, questions were answered to patient's satisfaction. Follow-up in 1 week for assessment of tolerability. Follow-up in 2 weeks for lab and assessment prior to cycle two 5-FU.  [name], MD, PhD Hematology Oncology  [place] at [facility] 12/17/2018

## 2018-12-18 ENCOUNTER — Telehealth: Payer: Self-pay

## 2018-12-18 LAB — CEA: CEA: 3.6 ng/mL (ref 0.0–4.7)

## 2018-12-18 NOTE — Telephone Encounter (Signed)
Telephone call to patient for follow up after first chemotherapy.   States feeling fine.  Did have some nausea this morning but took antinausea meds and it went away.  States eating and drinking fluids well.  Encouraged patient to call for any questions or concerns.

## 2018-12-19 ENCOUNTER — Other Ambulatory Visit: Payer: Self-pay

## 2018-12-19 ENCOUNTER — Inpatient Hospital Stay: Payer: Medicare HMO

## 2018-12-19 VITALS — BP 121/67 | HR 89 | Temp 98.0°F | Resp 17

## 2018-12-19 DIAGNOSIS — Z66 Do not resuscitate: Secondary | ICD-10-CM | POA: Diagnosis not present

## 2018-12-19 DIAGNOSIS — R110 Nausea: Secondary | ICD-10-CM | POA: Diagnosis not present

## 2018-12-19 DIAGNOSIS — R918 Other nonspecific abnormal finding of lung field: Secondary | ICD-10-CM | POA: Diagnosis not present

## 2018-12-19 DIAGNOSIS — C182 Malignant neoplasm of ascending colon: Secondary | ICD-10-CM

## 2018-12-19 DIAGNOSIS — R197 Diarrhea, unspecified: Secondary | ICD-10-CM | POA: Diagnosis not present

## 2018-12-19 DIAGNOSIS — J029 Acute pharyngitis, unspecified: Secondary | ICD-10-CM | POA: Diagnosis not present

## 2018-12-19 DIAGNOSIS — D509 Iron deficiency anemia, unspecified: Secondary | ICD-10-CM | POA: Diagnosis not present

## 2018-12-19 DIAGNOSIS — K5909 Other constipation: Secondary | ICD-10-CM | POA: Diagnosis not present

## 2018-12-19 DIAGNOSIS — Z5111 Encounter for antineoplastic chemotherapy: Secondary | ICD-10-CM | POA: Diagnosis not present

## 2018-12-19 DIAGNOSIS — D125 Benign neoplasm of sigmoid colon: Secondary | ICD-10-CM | POA: Diagnosis not present

## 2018-12-19 MED ORDER — HEPARIN SOD (PORK) LOCK FLUSH 100 UNIT/ML IV SOLN
INTRAVENOUS | Status: AC
  Filled 2018-12-19: qty 5

## 2018-12-19 MED ORDER — HEPARIN SOD (PORK) LOCK FLUSH 100 UNIT/ML IV SOLN
500.0000 [IU] | Freq: Once | INTRAVENOUS | Status: AC | PRN
  Administered 2018-12-19: 500 [IU]

## 2018-12-23 ENCOUNTER — Other Ambulatory Visit: Payer: Self-pay

## 2018-12-23 ENCOUNTER — Encounter: Payer: Self-pay | Admitting: Oncology

## 2018-12-23 NOTE — Progress Notes (Signed)
Patient pre screened for office appointment, no questions or concerns today. 

## 2018-12-24 ENCOUNTER — Inpatient Hospital Stay (HOSPITAL_BASED_OUTPATIENT_CLINIC_OR_DEPARTMENT_OTHER): Payer: Medicare HMO | Admitting: Oncology

## 2018-12-24 ENCOUNTER — Inpatient Hospital Stay: Payer: Medicare HMO

## 2018-12-24 ENCOUNTER — Other Ambulatory Visit: Payer: Self-pay

## 2018-12-24 VITALS — BP 146/78 | HR 86 | Temp 98.1°F | Wt 130.0 lb

## 2018-12-24 DIAGNOSIS — R110 Nausea: Secondary | ICD-10-CM | POA: Diagnosis not present

## 2018-12-24 DIAGNOSIS — R918 Other nonspecific abnormal finding of lung field: Secondary | ICD-10-CM | POA: Diagnosis not present

## 2018-12-24 DIAGNOSIS — C182 Malignant neoplasm of ascending colon: Secondary | ICD-10-CM

## 2018-12-24 DIAGNOSIS — Z5111 Encounter for antineoplastic chemotherapy: Secondary | ICD-10-CM | POA: Diagnosis not present

## 2018-12-24 DIAGNOSIS — D125 Benign neoplasm of sigmoid colon: Secondary | ICD-10-CM | POA: Diagnosis not present

## 2018-12-24 DIAGNOSIS — J029 Acute pharyngitis, unspecified: Secondary | ICD-10-CM | POA: Diagnosis not present

## 2018-12-24 DIAGNOSIS — Z66 Do not resuscitate: Secondary | ICD-10-CM | POA: Diagnosis not present

## 2018-12-24 DIAGNOSIS — R197 Diarrhea, unspecified: Secondary | ICD-10-CM | POA: Diagnosis not present

## 2018-12-24 DIAGNOSIS — K5909 Other constipation: Secondary | ICD-10-CM | POA: Diagnosis not present

## 2018-12-24 DIAGNOSIS — D509 Iron deficiency anemia, unspecified: Secondary | ICD-10-CM | POA: Diagnosis not present

## 2018-12-24 LAB — COMPREHENSIVE METABOLIC PANEL
ALT: 28 U/L (ref 0–44)
AST: 31 U/L (ref 15–41)
Albumin: 4.4 g/dL (ref 3.5–5.0)
Alkaline Phosphatase: 71 U/L (ref 38–126)
Anion gap: 7 (ref 5–15)
BUN: 12 mg/dL (ref 8–23)
CO2: 24 mmol/L (ref 22–32)
Calcium: 9.4 mg/dL (ref 8.9–10.3)
Chloride: 105 mmol/L (ref 98–111)
Creatinine, Ser: 0.63 mg/dL (ref 0.44–1.00)
GFR calc Af Amer: >60 mL/min (ref >60)
GFR calc non Af Amer: >60 mL/min (ref >60)
Glucose, Bld: 112 mg/dL — ABNORMAL HIGH (ref 70–99)
Potassium: 4.1 mmol/L (ref 3.5–5.1)
Sodium: 136 mmol/L (ref 135–145)
Total Bilirubin: 0.4 mg/dL (ref 0.3–1.2)
Total Protein: 7.3 g/dL (ref 6.5–8.1)

## 2018-12-24 LAB — CBC WITH DIFFERENTIAL/PLATELET
Abs Immature Granulocytes: 0.01 10*3/uL (ref 0.00–0.07)
Basophils Absolute: 0 10*3/uL (ref 0.0–0.1)
Basophils Relative: 1 %
Eosinophils Absolute: 0.1 10*3/uL (ref 0.0–0.5)
Eosinophils Relative: 2 %
HCT: 39.6 % (ref 36.0–46.0)
Hemoglobin: 12.6 g/dL (ref 12.0–15.0)
Immature Granulocytes: 0 %
Lymphocytes Relative: 29 %
Lymphs Abs: 1.7 10*3/uL (ref 0.7–4.0)
MCH: 27.3 pg (ref 26.0–34.0)
MCHC: 31.8 g/dL (ref 30.0–36.0)
MCV: 85.9 fL (ref 80.0–100.0)
Monocytes Absolute: 0.4 10*3/uL (ref 0.1–1.0)
Monocytes Relative: 7 %
Neutro Abs: 3.5 10*3/uL (ref 1.7–7.7)
Neutrophils Relative %: 61 %
Platelets: 224 10*3/uL (ref 150–400)
RBC: 4.61 MIL/uL (ref 3.87–5.11)
RDW: 20.2 % — ABNORMAL HIGH (ref 11.5–15.5)
WBC: 5.8 10*3/uL (ref 4.0–10.5)
nRBC: 0 % (ref 0.0–0.2)

## 2018-12-25 NOTE — Progress Notes (Signed)
Hematology/Oncology  [facility] Telephone:([phone] [phone]   Patient Care Team: [name], MD as PCP - General (Internal Medicine) Clent Jacks, RN as Oncology Nurse Navigator  REFERRING PROVIDER: [name], MD  CHIEF COMPLAINTS/REASON FOR VISIT:  Follow-up for management of stage III colon cancer,  iron deficiency, lung nodules HISTORY OF PRESENTING ILLNESS:   Mary Beltran is a  78 y.o.  female with PMH listed below was seen in consultation at the request of  [name], MD  for evaluation of colon cancer Patient had acute cholecystitis and had laparoscopic cholecystectomy same day by Dr. Peyton Najjar. Patient was found to have iron deficiency anemia with hemoglobin around 9.2, ferritin 7 and iron 24. Baseline hemoglobin 1 year ago was 13.1.  Patient was started on iron supplementation with Ferrex 150 mg iron capsule daily. Reports to have chronic constipation.  Denies any blood in the stool. Patient was seen by gastroenterology and had endoscopy and colonoscopy done on 10/06/2018. There was a partially obstructing mass in the cecum which was biopsied. 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 Gastric mucosa shows reactive foveolar hyperplasia, stromal fibrosis and focal chronic inflammation.  Negative for H. pylori.  Duodenal biopsy shows no abnormalities.  No evidence of celiac disease.  Sigmoid polyps showed tubular adenoma. 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 10 pound weight loss within past few months. Denies any family history colon cancer.  Paternal grandmother passed away due to breast cancer. Per patient's request, I called patient's son [name] and sister [name], and put both of them on speaker phone so they can hear the conversation and participate in discussion.  # 10/23/2018, she underwent right hemicolectomy Pathology showed invasive moderately  differentiated adenocarcinoma involving cecum and adjacent terminal ileum with extension into pericecal adipose tissue and focally to inked serosal surface. Invasive moderately differentiated adenocarcinoma involving proximal ascending colon and ileocecal valve with extension into pericolonic adipose tissue. Tubular adenoma, sessile serrated polyp in the cecum, tubular adenoma sessile serrated polyp in the ascending colon.  Grade 2, tumor invades visceral peritoneum, all margins are negative. Pathological staging pT4a pN1c  #Iron deficiency anemia, status post IV Venofer treatments.   INTERVAL HISTORY Mary Beltran is a 78 y.o. female who has above history reviewed by me today presents for follow up visit for management of stage III right colon cancer, iron deficiency, lung nodules  #Status post 1 cycle of adjuvant 5-FU treatments 1 week ago. She reports feeling well today. Denies any fever, chills.  She experienced mild nausea and took antiemetics which relieved her nausea. Also had 4 episodes of loose bowel movements on day 5.  She drinks Gatorade to stay hydrated. Denies any pain today.  Feeling well.    Review of Systems  Constitutional: Negative for appetite change, chills, fatigue and fever.  HENT:   Negative for hearing loss and voice change.   Eyes: Negative for eye problems.  Respiratory: Negative for chest tightness and cough.   Cardiovascular: Negative for chest pain.  Gastrointestinal: Positive for diarrhea. Negative for abdominal distention, abdominal pain, blood in stool and constipation.  Endocrine: Negative for hot flashes.  Genitourinary: Negative for difficulty urinating and frequency.   Musculoskeletal: Negative for arthralgias.  Skin: Negative for itching and rash.  Neurological: Negative for extremity weakness.  Hematological: Negative for adenopathy.  Psychiatric/Behavioral: Negative for confusion.    MEDICAL HISTORY:  Past Medical History:  Diagnosis Date  .  Complication of anesthesia  NOT WITH GB  . GERD (gastroesophageal reflux disease)   . IDA (iron deficiency anemia) 10/13/2018  . PONV (postoperative nausea and vomiting)     SURGICAL HISTORY: Past Surgical History:  Procedure Laterality Date  . ABDOMINAL HYSTERECTOMY     complete  . APPENDECTOMY    . CHOLECYSTECTOMY N/A 08/22/2018   Procedure: LAPAROSCOPIC CHOLECYSTECTOMY;  Surgeon: Herbert Pun, MD;  Location: [facility] ORS;  Service: General;  Laterality: N/A;  . COLONOSCOPY    . LAPAROSCOPIC RIGHT COLECTOMY N/A 10/23/2018   Procedure: LAPAROSCOPIC HAND ASSISTED RIGHT COLECTOMY;  Surgeon: Herbert Pun, MD;  Location: [facility] ORS;  Service: General;  Laterality: N/A;  . PERCUTANEOUS PINNING WRIST FRACTURE Right   . PORTACATH PLACEMENT Right 12/08/2018   Procedure: INSERTION PORT-A-CATH, CENTRAL VENOUS CATH WITH SUBCUTANEOUS INFUSION PORT;  Surgeon: Herbert Pun, MD;  Location: [facility] ORS;  Service: General;  Laterality: Right;  . WRIST FOREIGN BODY REMOVAL Right    2016    SOCIAL HISTORY: Social History   Socioeconomic History  . Marital status: Divorced    Spouse name: Not on file  . Number of children: Not on file  . Years of education: Not on file  . Highest education level: Not on file  Occupational History  . Occupation: retired  Scientific laboratory technician  . Financial resource strain: Not on file  . Food insecurity    Worry: Not on file    Inability: Not on file  . Transportation needs    Medical: Not on file    Non-medical: Not on file  Tobacco Use  . Smoking status: Current Every Day Smoker    Packs/day: 0.25    Years: 60.00    Pack years: 15.00    Types: Cigarettes  . Smokeless tobacco: Never Used  . Tobacco comment: Patient states that this is the only thing she enjoys  Substance and Sexual Activity  . Alcohol use: No  . Drug use: Not Currently  . Sexual activity: Not Currently  Lifestyle  . Physical activity    Days per week: Not on file     Minutes per session: Not on file  . Stress: Not on file  Relationships  . Social Herbalist on phone: Not on file    Gets together: Not on file    Attends religious service: Not on file    Active member of club or organization: Not on file    Attends meetings of clubs or organizations: Not on file    Relationship status: Not on file  . Intimate partner violence    Fear of current or ex partner: Not on file    Emotionally abused: Not on file    Physically abused: Not on file    Forced sexual activity: Not on file  Other Topics Concern  . Not on file  Social History Narrative  . Not on file    FAMILY HISTORY: Family History  Problem Relation Age of Onset  . COPD Mother   . Suicidality Father   . Breast cancer Paternal Grandmother   . Failure to thrive Sister     ALLERGIES:  is allergic to pain & fever [acetaminophen]; sulfa antibiotics; codeine; and penicillins.  MEDICATIONS:  Current Outpatient Medications  Medication Sig Dispense Refill  . lidocaine-prilocaine (EMLA) cream Apply to affected area once 30 g 3  . ondansetron (ZOFRAN) 8 MG tablet Take 1 tablet (8 mg total) by mouth 2 (two) times daily as needed (Nausea or vomiting). 30 tablet 1  .  prochlorperazine (COMPAZINE) 10 MG tablet Take 1 tablet (10 mg total) by mouth every 6 (six) hours as needed (Nausea or vomiting). 30 tablet 1   No current facility-administered medications for this visit.      PHYSICAL EXAMINATION: ECOG PERFORMANCE STATUS: 1 - Symptomatic but completely ambulatory Physical Exam Constitutional:      General: She is not in acute distress.    Comments: Thin, walk in   HENT:     Head: Normocephalic and atraumatic.  Eyes:     General: No scleral icterus.    Pupils: Pupils are equal, round, and reactive to light.  Neck:     Musculoskeletal: Normal range of motion and neck supple.  Cardiovascular:     Rate and Rhythm: Normal rate and regular rhythm.     Heart sounds: Normal heart  sounds.  Pulmonary:     Effort: Pulmonary effort is normal. No respiratory distress.     Breath sounds: No wheezing.  Abdominal:     General: Bowel sounds are normal. There is no distension.     Palpations: Abdomen is soft. There is no mass.     Tenderness: There is no abdominal tenderness.  Musculoskeletal: Normal range of motion.        General: No deformity.  Skin:    General: Skin is warm and dry.     Coloration: Skin is not pale.     Findings: No erythema or rash.  Neurological:     Mental Status: She is alert and oriented to person, place, and time.     Cranial Nerves: No cranial nerve deficit.     Coordination: Coordination normal.  Psychiatric:        Behavior: Behavior normal.        Thought Content: Thought content normal.     LABORATORY DATA:  I have reviewed the data as listed Lab Results  Component Value Date   WBC 5.8 12/24/2018   HGB 12.6 12/24/2018   HCT 39.6 12/24/2018   MCV 85.9 12/24/2018   PLT 224 12/24/2018   Recent Labs    10/13/18 1154  10/26/18 0504 12/17/18 0846 12/24/18 1348  NA 135   < > 136 135 136  K 3.9   < > 3.9 3.7 4.1  CL 102   < > 110 103 105  CO2 21*   < > 22 22 24   GLUCOSE 106*   < > 106* 119* 112*  BUN 9   < > 11 11 12   CREATININE 0.67   < > 0.83 0.72 0.63  CALCIUM 8.9   < > 8.0* 9.5 9.4  GFRNONAA >60   < > >60 >60 >60  GFRAA >60   < > >60 >60 >60  PROT 7.4  --   --  7.0 7.3  ALBUMIN 4.0  --   --  4.4 4.4  AST 18  --   --  35 31  ALT 11  --   --  27 28  ALKPHOS 78  --   --  80 71  BILITOT 0.5  --   --  0.4 0.4   < > = values in this interval not displayed.   Iron/TIBC/Ferritin/ %Sat    Component Value Date/Time   IRON 20 (L) 10/13/2018 1154   TIBC 547 (H) 10/13/2018 1154   FERRITIN 6 (L) 10/13/2018 1154   IRONPCTSAT 4 (L) 10/13/2018 1154      RADIOGRAPHIC STUDIES: I have personally reviewed the radiological images as listed and agreed  with the findings in the report.  Dg Chest Port 1 View  Result Date:  12/08/2018 CLINICAL DATA:  Port placement EXAM: PORTABLE CHEST 1 VIEW COMPARISON:  10/09/2018 chest CT FINDINGS: Right internal jugular Port-A-Cath terminates in the middle third of the SVC. Normal heart size. Atherosclerotic aortic arch. Otherwise normal mediastinal contour. No pneumothorax. No pleural effusion. No overt pulmonary edema. No acute consolidative airspace disease. Emphysema. IMPRESSION: Right internal jugular Port-A-Cath terminates in the middle third of the SVC. No pneumothorax. No acute pulmonary disease. Emphysema. Electronically Signed   By: Ilona Sorrel M.D.   On: 12/08/2018 08:45   Dg C-arm 1-60 Min-no Report  Result Date: 12/08/2018 Fluoroscopy was utilized by the requesting physician.  No radiographic interpretation.      ASSESSMENT & PLAN:  1. Cancer of right colon [facility])   Cancer Staging Cancer of right colon [facility]) Staging form: Colon and Rectum, AJCC 8th Edition - Pathologic stage from 10/31/2018: Stage IIIB (pT4a, pN1c, cM0) - Signed by [name], MD on 10/31/2018   #Stage IIIB right colon adenocarcinoma Labs are reviewed and discussed with patient. Stable counts. She clinically tolerates treatments well.   #Nausea, chemotherapy-induced, continue use antiemetics. #Diarrhea, chemotherapy-induced, discussed with patient about using Imodium PRN.  Continue stay well-hydrated.  #Family history of.Cancer, recommend genetic testing and was previously discussed with patient.  She wants to consider it and update me. Follow-up in 1 weeks for lab and assessment prior to cycle two 5-FU.  [name], MD, PhD Hematology Oncology [place] at [facility] 12/25/2018

## 2018-12-30 ENCOUNTER — Encounter: Payer: Self-pay | Admitting: Oncology

## 2018-12-30 ENCOUNTER — Other Ambulatory Visit: Payer: Self-pay

## 2018-12-30 NOTE — Progress Notes (Signed)
Patient pre screened for office appointment, no questions or concerns today. 

## 2018-12-31 ENCOUNTER — Inpatient Hospital Stay (HOSPITAL_BASED_OUTPATIENT_CLINIC_OR_DEPARTMENT_OTHER): Payer: Medicare HMO | Admitting: Oncology

## 2018-12-31 ENCOUNTER — Inpatient Hospital Stay (HOSPITAL_BASED_OUTPATIENT_CLINIC_OR_DEPARTMENT_OTHER): Payer: Medicare HMO | Admitting: Hospice and Palliative Medicine

## 2018-12-31 ENCOUNTER — Other Ambulatory Visit: Payer: Self-pay

## 2018-12-31 ENCOUNTER — Inpatient Hospital Stay: Payer: Medicare HMO

## 2018-12-31 VITALS — BP 148/83 | HR 84 | Temp 96.0°F | Resp 16 | Wt 132.0 lb

## 2018-12-31 DIAGNOSIS — Z809 Family history of malignant neoplasm, unspecified: Secondary | ICD-10-CM | POA: Diagnosis not present

## 2018-12-31 DIAGNOSIS — R197 Diarrhea, unspecified: Secondary | ICD-10-CM | POA: Diagnosis not present

## 2018-12-31 DIAGNOSIS — C182 Malignant neoplasm of ascending colon: Secondary | ICD-10-CM

## 2018-12-31 DIAGNOSIS — Z515 Encounter for palliative care: Secondary | ICD-10-CM

## 2018-12-31 DIAGNOSIS — D125 Benign neoplasm of sigmoid colon: Secondary | ICD-10-CM | POA: Diagnosis not present

## 2018-12-31 DIAGNOSIS — D509 Iron deficiency anemia, unspecified: Secondary | ICD-10-CM | POA: Diagnosis not present

## 2018-12-31 DIAGNOSIS — R110 Nausea: Secondary | ICD-10-CM | POA: Diagnosis not present

## 2018-12-31 DIAGNOSIS — Z66 Do not resuscitate: Secondary | ICD-10-CM | POA: Diagnosis not present

## 2018-12-31 DIAGNOSIS — Z7189 Other specified counseling: Secondary | ICD-10-CM | POA: Diagnosis not present

## 2018-12-31 DIAGNOSIS — K5909 Other constipation: Secondary | ICD-10-CM | POA: Diagnosis not present

## 2018-12-31 DIAGNOSIS — R918 Other nonspecific abnormal finding of lung field: Secondary | ICD-10-CM | POA: Diagnosis not present

## 2018-12-31 DIAGNOSIS — J029 Acute pharyngitis, unspecified: Secondary | ICD-10-CM | POA: Diagnosis not present

## 2018-12-31 DIAGNOSIS — Z5111 Encounter for antineoplastic chemotherapy: Secondary | ICD-10-CM | POA: Diagnosis not present

## 2018-12-31 LAB — COMPREHENSIVE METABOLIC PANEL
ALT: 18 U/L (ref 0–44)
AST: 22 U/L (ref 15–41)
Albumin: 4.2 g/dL (ref 3.5–5.0)
Alkaline Phosphatase: 70 U/L (ref 38–126)
Anion gap: 8 (ref 5–15)
BUN: 15 mg/dL (ref 8–23)
CO2: 23 mmol/L (ref 22–32)
Calcium: 9.3 mg/dL (ref 8.9–10.3)
Chloride: 105 mmol/L (ref 98–111)
Creatinine, Ser: 0.67 mg/dL (ref 0.44–1.00)
GFR calc Af Amer: >60 mL/min (ref >60)
GFR calc non Af Amer: >60 mL/min (ref >60)
Glucose, Bld: 103 mg/dL — ABNORMAL HIGH (ref 70–99)
Potassium: 3.9 mmol/L (ref 3.5–5.1)
Sodium: 136 mmol/L (ref 135–145)
Total Bilirubin: 0.5 mg/dL (ref 0.3–1.2)
Total Protein: 7.3 g/dL (ref 6.5–8.1)

## 2018-12-31 LAB — CBC WITH DIFFERENTIAL/PLATELET
Abs Immature Granulocytes: 0.01 10*3/uL (ref 0.00–0.07)
Basophils Absolute: 0 10*3/uL (ref 0.0–0.1)
Basophils Relative: 1 %
Eosinophils Absolute: 0.1 10*3/uL (ref 0.0–0.5)
Eosinophils Relative: 2 %
HCT: 38 % (ref 36.0–46.0)
Hemoglobin: 12.3 g/dL (ref 12.0–15.0)
Immature Granulocytes: 0 %
Lymphocytes Relative: 26 %
Lymphs Abs: 1.3 10*3/uL (ref 0.7–4.0)
MCH: 28 pg (ref 26.0–34.0)
MCHC: 32.4 g/dL (ref 30.0–36.0)
MCV: 86.4 fL (ref 80.0–100.0)
Monocytes Absolute: 0.5 10*3/uL (ref 0.1–1.0)
Monocytes Relative: 11 %
Neutro Abs: 3 10*3/uL (ref 1.7–7.7)
Neutrophils Relative %: 60 %
Platelets: 207 10*3/uL (ref 150–400)
RBC: 4.4 MIL/uL (ref 3.87–5.11)
RDW: 20 % — ABNORMAL HIGH (ref 11.5–15.5)
WBC: 5 10*3/uL (ref 4.0–10.5)
nRBC: 0 % (ref 0.0–0.2)

## 2018-12-31 MED ORDER — LEUCOVORIN CALCIUM INJECTION 350 MG: 400.0000 mg/m2 | Freq: Once | INTRAVENOUS | Status: DC

## 2018-12-31 MED ORDER — FLUOROURACIL CHEMO INJECTION 2.5 GM/50ML
400.0000 mg/m2 | Freq: Once | INTRAVENOUS | Status: AC
  Administered 2018-12-31: 700 mg via INTRAVENOUS
  Filled 2018-12-31: qty 14

## 2018-12-31 MED ORDER — SODIUM CHLORIDE 0.9 % IV SOLN
2400.0000 mg/m2 | INTRAVENOUS | Status: DC
  Administered 2018-12-31: 4150 mg via INTRAVENOUS
  Filled 2018-12-31: qty 83

## 2018-12-31 MED ORDER — LEUCOVORIN CALCIUM INJECTION 100 MG
20.0000 mg/m2 | Freq: Once | INTRAMUSCULAR | Status: AC
  Administered 2018-12-31: 34 mg via INTRAVENOUS
  Filled 2018-12-31: qty 1.7

## 2018-12-31 MED ORDER — PROCHLORPERAZINE MALEATE 10 MG PO TABS
10.0000 mg | ORAL_TABLET | Freq: Once | ORAL | Status: AC
  Administered 2018-12-31: 10 mg via ORAL
  Filled 2018-12-31: qty 1

## 2018-12-31 MED ORDER — SODIUM CHLORIDE 0.9 % IV SOLN
Freq: Once | INTRAVENOUS | Status: AC
  Administered 2018-12-31: 10:00:00 via INTRAVENOUS
  Filled 2018-12-31: qty 250

## 2018-12-31 MED ORDER — SODIUM CHLORIDE 0.9% FLUSH
10.0000 mL | Freq: Once | INTRAVENOUS | Status: AC
  Administered 2018-12-31: 10 mL via INTRAVENOUS
  Filled 2018-12-31: qty 10

## 2018-12-31 NOTE — Progress Notes (Signed)
[facility]  Telephone:([phone] [phone]   Name: Mary Beltran Date: 12/31/2018 MRN: [id number]  DOB: Aug 06, 1940  Patient Care Team: [name], MD as PCP - General (Internal Medicine) Clent Jacks, RN as Oncology Nurse Navigator    REASON FOR CONSULTATION: Palliative Care consult requested for this 78 y.o. 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  PMH also notable for lung nodules and IDA requiring IV iron.  Patient initially declined adjuvant chemotherapy and radiation but then changed her mind and now wishes to proceed.  She was referred to palliative care for clarification of goals and to manage ongoing symptoms.   SOCIAL HISTORY:     reports that she has been smoking cigarettes. She has a 15.00 pack-year smoking history. She has never used smokeless tobacco. She reports previous drug use. She reports that she does not drink alcohol.   Patient is not married.  She lives at home alone in a townhouse.  She has 2 sons, 1 of whom lives in [place] and the other in [place].  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  ADVANCE DIRECTIVES:  Son is healthcare power of attorney.  She has a living will  CODE STATUS: DNR/DNI (MOST Form completed on 12/31/2018)  PAST MEDICAL HISTORY: Past Medical History:  Diagnosis Date  . Complication of anesthesia    NOT WITH GB  . GERD (gastroesophageal reflux disease)   . IDA (iron deficiency anemia) 10/13/2018  . PONV (postoperative nausea and vomiting)     PAST SURGICAL HISTORY:  Past Surgical History:  Procedure Laterality Date  . ABDOMINAL HYSTERECTOMY     complete  . APPENDECTOMY    . CHOLECYSTECTOMY N/A 08/22/2018   Procedure: LAPAROSCOPIC CHOLECYSTECTOMY;  Surgeon: Herbert Pun, MD;  Location: [facility] ORS;  Service: General;  Laterality: N/A;  . COLONOSCOPY     . LAPAROSCOPIC RIGHT COLECTOMY N/A 10/23/2018   Procedure: LAPAROSCOPIC HAND ASSISTED RIGHT COLECTOMY;  Surgeon: Herbert Pun, MD;  Location: [facility] ORS;  Service: General;  Laterality: N/A;  . PERCUTANEOUS PINNING WRIST FRACTURE Right   . PORTACATH PLACEMENT Right 12/08/2018   Procedure: INSERTION PORT-A-CATH, CENTRAL VENOUS CATH WITH SUBCUTANEOUS INFUSION PORT;  Surgeon: Herbert Pun, MD;  Location: [facility] ORS;  Service: General;  Laterality: Right;  . WRIST FOREIGN BODY REMOVAL Right    2016    HEMATOLOGY/ONCOLOGY HISTORY:  Oncology History  Cancer of right colon ([place])  10/23/2018 Initial Diagnosis   Cancer of right colon ([place])   10/31/2018 Cancer Staging   Staging form: Colon and Rectum, AJCC 8th Edition - Pathologic stage from 10/31/2018: Stage IIIB (pT4a, pN1c, cM0) - Signed by [name], MD on 10/31/2018   12/17/2018 -  Chemotherapy   The patient had leucovorin 692 mg in dextrose 5 % 250 mL infusion, 400 mg/m2 = 692 mg, Intravenous,  Once, 2 of 12 cycles fluorouracil (ADRUCIL) chemo injection 700 mg, 400 mg/m2 = 700 mg, Intravenous,  Once, 2 of 12 cycles Administration: 700 mg (12/17/2018) fluorouracil (ADRUCIL) 4,150 mg in sodium chloride 0.9 % 67 mL chemo infusion, 2,400 mg/m2 = 4,150 mg, Intravenous, 1 Day/Dose, 2 of 12 cycles Administration: 4,150 mg (12/17/2018)  for chemotherapy treatment.      ALLERGIES:  is allergic to pain & fever [acetaminophen]; sulfa antibiotics; codeine; and penicillins.  MEDICATIONS:  Current Outpatient Medications  Medication Sig Dispense Refill  . lidocaine-prilocaine (EMLA) cream Apply to affected area  once 30 g 3  . ondansetron (ZOFRAN) 8 MG tablet Take 1 tablet (8 mg total) by mouth 2 (two) times daily as needed (Nausea or vomiting). 30 tablet 1  . prochlorperazine (COMPAZINE) 10 MG tablet Take 1 tablet (10 mg total) by mouth every 6 (six) hours as needed (Nausea or vomiting). 30 tablet 1   No current facility-administered  medications for this visit.    Facility-Administered Medications Ordered in Other Visits  Medication Dose Route Frequency Provider Last Rate Last Dose  . fluorouracil (ADRUCIL) 4,150 mg in sodium chloride 0.9 % 67 mL chemo infusion  2,400 mg/m2 (Treatment Plan Recorded) Intravenous 1 day or 1 dose [name], MD   4,150 mg at 12/31/18 1107    VITAL SIGNS: There were no vitals taken for this visit. There were no vitals filed for this visit.  Estimated body mass index is 20.67 kg/m as calculated from the following:   Height as of 12/17/18: _0  (1.702 m).   Weight as of an earlier encounter on 12/31/18: 132 lb (59.9 kg).  LABS: CBC:    Component Value Date/Time   WBC 5.0 12/31/2018 0901   HGB 12.3 12/31/2018 0901   HCT 38.0 12/31/2018 0901   PLT 207 12/31/2018 0901   MCV 86.4 12/31/2018 0901   NEUTROABS 3.0 12/31/2018 0901   LYMPHSABS 1.3 12/31/2018 0901   MONOABS 0.5 12/31/2018 0901   EOSABS 0.1 12/31/2018 0901   BASOSABS 0.0 12/31/2018 0901   Comprehensive Metabolic Panel:    Component Value Date/Time   NA 136 12/31/2018 0901   K 3.9 12/31/2018 0901   CL 105 12/31/2018 0901   CO2 23 12/31/2018 0901   BUN 15 12/31/2018 0901   CREATININE 0.67 12/31/2018 0901   GLUCOSE 103 (H) 12/31/2018 0901   CALCIUM 9.3 12/31/2018 0901   AST 22 12/31/2018 0901   ALT 18 12/31/2018 0901   ALKPHOS 70 12/31/2018 0901   BILITOT 0.5 12/31/2018 0901   PROT 7.3 12/31/2018 0901   ALBUMIN 4.2 12/31/2018 0901    RADIOGRAPHIC STUDIES: Dg Chest Port 1 View  Result Date: 12/08/2018 CLINICAL DATA:  Port placement EXAM: PORTABLE CHEST 1 VIEW COMPARISON:  10/09/2018 chest CT FINDINGS: Right internal jugular Port-A-Cath terminates in the middle third of the SVC. Normal heart size. Atherosclerotic aortic arch. Otherwise normal mediastinal contour. No pneumothorax. No pleural effusion. No overt pulmonary edema. No acute consolidative airspace disease. Emphysema. IMPRESSION: Right internal jugular  Port-A-Cath terminates in the middle third of the SVC. No pneumothorax. No acute pulmonary disease. Emphysema. Electronically Signed   By: Ilona Sorrel M.D.   On: 12/08/2018 08:45   Dg C-arm 1-60 Min-no Report  Result Date: 12/08/2018 Fluoroscopy was utilized by the requesting physician.  No radiographic interpretation.    PERFORMANCE STATUS (ECOG) : 1 - Symptomatic but completely ambulatory  Review of Systems Unless otherwise noted, a complete review of systems is negative.  Physical Exam General: NAD, frail appearing, thin Pulmonary: unlabored Extremities: no edema, no joint deformities Skin: no rashes Neurological: Weakness but otherwise nonfocal  IMPRESSION: I met with patient today in the infusion area.    Patient reports that she is doing well.  She denies any acute changes or concerns.  She denies any distressing symptoms today.  She says that her appetite has improved and that she is "eating like a horse."  I do note that she has gained 4 pounds over the past month to current weight of 132 pounds.  Patient says that she has  completed both a living will and healthcare power of attorney.  I note that they were scanned into the chart on 11/27/2018.  Patient says that she wants to be a DO NOT RESUSCITATE.  She is not interested in aggressive measures at end-of-life and would prefer to be kept comfortable.  She tells me that she was previously on a ventilator many years ago for about a month and would not want to repeat that experience.  She would be okay with short-term hospitalization to treat the treatable.   I completed a MOST form today. The patient outlined their wishes for the following treatment decisions:  Cardiopulmonary Resuscitation: Do Not Attempt Resuscitation (DNR/No CPR)  Medical Interventions: Limited Additional Interventions: Use medical treatment, IV fluids and cardiac monitoring as indicated, DO NOT USE intubation or mechanical ventilation. May consider use of  less invasive airway support such as BiPAP or CPAP. Also provide comfort measures. Transfer to the hospital if indicated. Avoid intensive care.   Antibiotics: Antibiotics if indicated  IV Fluids: IV fluids if indicated  Feeding Tube: No feeding tube     PLAN: -Continue current scope of treatment -MOST form completed (DNR/DNI) -RTC in 3-4 weeks   Patient expressed understanding and was in agreement with this plan. She also understands that She can call the clinic at any time with any questions, concerns, or complaints.     Time Total: 20 minutes  Visit consisted of counseling and education dealing with the complex and emotionally intense issues of symptom management and palliative care in the setting of serious and potentially life-threatening illness.Greater than 50%  of this time was spent counseling and coordinating care related to the above assessment and plan.  Signed by: Altha Harm, PhD, NP-C [phone] (Work Cell)

## 2018-12-31 NOTE — Progress Notes (Signed)
Hematology/Oncology  [facility] Telephone:([phone] [phone]   Patient Care Team: [name], MD as PCP - General (Internal Medicine) Clent Jacks, RN as Oncology Nurse Navigator  REFERRING PROVIDER: [name], MD  CHIEF COMPLAINTS/REASON FOR VISIT:  Follow-up for management of stage III colon cancer,  iron deficiency, lung nodules HISTORY OF PRESENTING ILLNESS:   Mary Beltran is a  78 y.o.  female with PMH listed below was seen in consultation at the request of  [name], MD  for evaluation of colon cancer Patient had acute cholecystitis and had laparoscopic cholecystectomy same day by Dr. Peyton Najjar. Patient was found to have iron deficiency anemia with hemoglobin around 9.2, ferritin 7 and iron 24. Baseline hemoglobin 1 year ago was 13.1.  Patient was started on iron supplementation with Ferrex 150 mg iron capsule daily. Reports to have chronic constipation.  Denies any blood in the stool. Patient was seen by gastroenterology and had endoscopy and colonoscopy done on 10/06/2018. There was a partially obstructing mass in the cecum which was biopsied. 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 Gastric mucosa shows reactive foveolar hyperplasia, stromal fibrosis and focal chronic inflammation.  Negative for H. pylori.  Duodenal biopsy shows no abnormalities.  No evidence of celiac disease.  Sigmoid polyps showed tubular adenoma. 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 10 pound weight loss within past few months. Denies any family history colon cancer.  Paternal grandmother passed away due to breast cancer. Per patient's request, I called patient's son [name] and sister [name], and put both of them on speaker phone so they can hear the conversation and participate in discussion.  # 10/23/2018, she underwent right hemicolectomy Pathology showed invasive moderately  differentiated adenocarcinoma involving cecum and adjacent terminal ileum with extension into pericecal adipose tissue and focally to inked serosal surface. Invasive moderately differentiated adenocarcinoma involving proximal ascending colon and ileocecal valve with extension into pericolonic adipose tissue. Tubular adenoma, sessile serrated polyp in the cecum, tubular adenoma sessile serrated polyp in the ascending colon.  Grade 2, tumor invades visceral peritoneum, all margins are negative. Pathological staging pT4a pN1c  #Iron deficiency anemia, status post IV Venofer treatments.   INTERVAL HISTORY Mary Beltran is a 78 y.o. female who has above history reviewed by me today presents for follow up visit for management of stage III right colon cancer, iron deficiency, lung nodules  #Status post 1 cycle of adjuvant 5-FU treatments 2 weeks ago  Patient reports clinically doing well  She has some sore throat for which she uses salt water rinse with improvement.  No sore throat today. Denies any fever, chills, nausea, vomiting.   No diarrhea today  denies any pain. .    Review of Systems  Constitutional: Negative for appetite change, chills, fatigue and fever.  HENT:   Negative for hearing loss and voice change.   Eyes: Negative for eye problems.  Respiratory: Negative for chest tightness and cough.   Cardiovascular: Negative for chest pain.  Gastrointestinal: Negative for abdominal distention, abdominal pain, blood in stool, constipation and diarrhea.  Endocrine: Negative for hot flashes.  Genitourinary: Negative for difficulty urinating and frequency.   Musculoskeletal: Negative for arthralgias.  Skin: Negative for itching and rash.  Neurological: Negative for extremity weakness.  Hematological: Negative for adenopathy.  Psychiatric/Behavioral: Negative for confusion.    MEDICAL HISTORY:  Past Medical History:  Diagnosis Date  . Complication of anesthesia    NOT WITH GB  . GERD  (  gastroesophageal reflux disease)   . IDA (iron deficiency anemia) 10/13/2018  . PONV (postoperative nausea and vomiting)     SURGICAL HISTORY: Past Surgical History:  Procedure Laterality Date  . ABDOMINAL HYSTERECTOMY     complete  . APPENDECTOMY    . CHOLECYSTECTOMY N/A 08/22/2018   Procedure: LAPAROSCOPIC CHOLECYSTECTOMY;  Surgeon: Herbert Pun, MD;  Location: [facility] ORS;  Service: General;  Laterality: N/A;  . COLONOSCOPY    . LAPAROSCOPIC RIGHT COLECTOMY N/A 10/23/2018   Procedure: LAPAROSCOPIC HAND ASSISTED RIGHT COLECTOMY;  Surgeon: Herbert Pun, MD;  Location: [facility] ORS;  Service: General;  Laterality: N/A;  . PERCUTANEOUS PINNING WRIST FRACTURE Right   . PORTACATH PLACEMENT Right 12/08/2018   Procedure: INSERTION PORT-A-CATH, CENTRAL VENOUS CATH WITH SUBCUTANEOUS INFUSION PORT;  Surgeon: Herbert Pun, MD;  Location: [facility] ORS;  Service: General;  Laterality: Right;  . WRIST FOREIGN BODY REMOVAL Right    2016    SOCIAL HISTORY: Social History   Socioeconomic History  . Marital status: Divorced    Spouse name: Not on file  . Number of children: Not on file  . Years of education: Not on file  . Highest education level: Not on file  Occupational History  . Occupation: retired  Scientific laboratory technician  . Financial resource strain: Not on file  . Food insecurity    Worry: Not on file    Inability: Not on file  . Transportation needs    Medical: Not on file    Non-medical: Not on file  Tobacco Use  . Smoking status: Current Every Day Smoker    Packs/day: 0.25    Years: 60.00    Pack years: 15.00    Types: Cigarettes  . Smokeless tobacco: Never Used  . Tobacco comment: Patient states that this is the only thing she enjoys  Substance and Sexual Activity  . Alcohol use: No  . Drug use: Not Currently  . Sexual activity: Not Currently  Lifestyle  . Physical activity    Days per week: Not on file    Minutes per session: Not on file  . Stress: Not on file   Relationships  . Social Herbalist on phone: Not on file    Gets together: Not on file    Attends religious service: Not on file    Active member of club or organization: Not on file    Attends meetings of clubs or organizations: Not on file    Relationship status: Not on file  . Intimate partner violence    Fear of current or ex partner: Not on file    Emotionally abused: Not on file    Physically abused: Not on file    Forced sexual activity: Not on file  Other Topics Concern  . Not on file  Social History Narrative  . Not on file    FAMILY HISTORY: Family History  Problem Relation Age of Onset  . COPD Mother   . Suicidality Father   . Breast cancer Paternal Grandmother   . Failure to thrive Sister     ALLERGIES:  is allergic to pain & fever [acetaminophen]; sulfa antibiotics; codeine; and penicillins.  MEDICATIONS:  Current Outpatient Medications  Medication Sig Dispense Refill  . lidocaine-prilocaine (EMLA) cream Apply to affected area once 30 g 3  . ondansetron (ZOFRAN) 8 MG tablet Take 1 tablet (8 mg total) by mouth 2 (two) times daily as needed (Nausea or vomiting). 30 tablet 1  . prochlorperazine (COMPAZINE) 10 MG tablet  Take 1 tablet (10 mg total) by mouth every 6 (six) hours as needed (Nausea or vomiting). 30 tablet 1   No current facility-administered medications for this visit.      PHYSICAL EXAMINATION: ECOG PERFORMANCE STATUS: 1 - Symptomatic but completely ambulatory Physical Exam Constitutional:      General: She is not in acute distress.    Comments: Thin, walk in   HENT:     Head: Normocephalic and atraumatic.  Eyes:     General: No scleral icterus.    Pupils: Pupils are equal, round, and reactive to light.  Neck:     Musculoskeletal: Normal range of motion and neck supple.  Cardiovascular:     Rate and Rhythm: Normal rate and regular rhythm.     Heart sounds: Normal heart sounds.  Pulmonary:     Effort: Pulmonary effort is  normal. No respiratory distress.     Breath sounds: No wheezing.  Abdominal:     General: Bowel sounds are normal. There is no distension.     Palpations: Abdomen is soft. There is no mass.     Tenderness: There is no abdominal tenderness.  Musculoskeletal: Normal range of motion.        General: No deformity.  Skin:    General: Skin is warm and dry.     Coloration: Skin is not pale.     Findings: No erythema or rash.  Neurological:     Mental Status: She is alert and oriented to person, place, and time.     Cranial Nerves: No cranial nerve deficit.     Coordination: Coordination normal.  Psychiatric:        Behavior: Behavior normal.        Thought Content: Thought content normal.     LABORATORY DATA:  I have reviewed the data as listed Lab Results  Component Value Date   WBC 5.0 12/31/2018   HGB 12.3 12/31/2018   HCT 38.0 12/31/2018   MCV 86.4 12/31/2018   PLT 207 12/31/2018   Recent Labs    12/17/18 0846 12/24/18 1348 12/31/18 0901  NA 135 136 136  K 3.7 4.1 3.9  CL 103 105 105  CO2 22 24 23   GLUCOSE 119* 112* 103*  BUN 11 12 15   CREATININE 0.72 0.63 0.67  CALCIUM 9.5 9.4 9.3  GFRNONAA >60 >60 >60  GFRAA >60 >60 >60  PROT 7.0 7.3 7.3  ALBUMIN 4.4 4.4 4.2  AST 35 31 22  ALT 27 28 18   ALKPHOS 80 71 70  BILITOT 0.4 0.4 0.5   Iron/TIBC/Ferritin/ %Sat    Component Value Date/Time   IRON 20 (L) 10/13/2018 1154   TIBC 547 (H) 10/13/2018 1154   FERRITIN 6 (L) 10/13/2018 1154   IRONPCTSAT 4 (L) 10/13/2018 1154      RADIOGRAPHIC STUDIES: I have personally reviewed the radiological images as listed and agreed with the findings in the report.  Dg Chest Port 1 View  Result Date: 12/08/2018 CLINICAL DATA:  Port placement EXAM: PORTABLE CHEST 1 VIEW COMPARISON:  10/09/2018 chest CT FINDINGS: Right internal jugular Port-A-Cath terminates in the middle third of the SVC. Normal heart size. Atherosclerotic aortic arch. Otherwise normal mediastinal contour. No  pneumothorax. No pleural effusion. No overt pulmonary edema. No acute consolidative airspace disease. Emphysema. IMPRESSION: Right internal jugular Port-A-Cath terminates in the middle third of the SVC. No pneumothorax. No acute pulmonary disease. Emphysema. Electronically Signed   By: Ilona Sorrel M.D.   On: 12/08/2018 08:45  Dg C-arm 1-60 Min-no Report  Result Date: 12/08/2018 Fluoroscopy was utilized by the requesting physician.  No radiographic interpretation.      ASSESSMENT & PLAN:  1. Cancer of right colon ([place])   2. Family history of cancer   3. Nausea without vomiting   Cancer Staging Cancer of right colon [facility]) Staging form: Colon and Rectum, AJCC 8th Edition - Pathologic stage from 10/31/2018: Stage IIIB (pT4a, pN1c, cM0) - Signed by [name], MD on 10/31/2018   #Stage IIIB right colon adenocarcinoma Labs are reviewed and discussed with patient. Patient tolerates cycle one 5-FU well with mild symptoms. Labs are reviewed and discussed with patient. Counts acceptable to proceed with cycle 2 adjuvant 5-FU chemotherapy   #Nausea, chemotherapy-induced, continue home antiemetics. #Diarrhea, resolved.  Continue to monitor. #Family history of cancer, recommend genetic testing.  Patient declines and want to consider and update me if she changes her mind.  Follow-up in 2 weeks for assessment prior to the next cycle of treatment. [name], MD, PhD Hematology Oncology [place] [facility] 12/31/2018

## 2019-01-02 ENCOUNTER — Other Ambulatory Visit: Payer: Self-pay

## 2019-01-02 ENCOUNTER — Inpatient Hospital Stay: Payer: Medicare HMO

## 2019-01-02 DIAGNOSIS — D125 Benign neoplasm of sigmoid colon: Secondary | ICD-10-CM | POA: Diagnosis not present

## 2019-01-02 DIAGNOSIS — Z5111 Encounter for antineoplastic chemotherapy: Secondary | ICD-10-CM | POA: Diagnosis not present

## 2019-01-02 DIAGNOSIS — D509 Iron deficiency anemia, unspecified: Secondary | ICD-10-CM | POA: Diagnosis not present

## 2019-01-02 DIAGNOSIS — C182 Malignant neoplasm of ascending colon: Secondary | ICD-10-CM

## 2019-01-02 DIAGNOSIS — J029 Acute pharyngitis, unspecified: Secondary | ICD-10-CM | POA: Diagnosis not present

## 2019-01-02 DIAGNOSIS — Z66 Do not resuscitate: Secondary | ICD-10-CM | POA: Diagnosis not present

## 2019-01-02 DIAGNOSIS — R110 Nausea: Secondary | ICD-10-CM | POA: Diagnosis not present

## 2019-01-02 DIAGNOSIS — R197 Diarrhea, unspecified: Secondary | ICD-10-CM | POA: Diagnosis not present

## 2019-01-02 DIAGNOSIS — K5909 Other constipation: Secondary | ICD-10-CM | POA: Diagnosis not present

## 2019-01-02 DIAGNOSIS — R918 Other nonspecific abnormal finding of lung field: Secondary | ICD-10-CM | POA: Diagnosis not present

## 2019-01-02 MED ORDER — HEPARIN SOD (PORK) LOCK FLUSH 100 UNIT/ML IV SOLN
INTRAVENOUS | Status: AC
  Filled 2019-01-02: qty 5

## 2019-01-02 MED ORDER — SODIUM CHLORIDE 0.9% FLUSH
10.0000 mL | INTRAVENOUS | Status: DC | PRN
  Administered 2019-01-02: 11:00:00 10 mL via INTRAVENOUS
  Filled 2019-01-02: qty 10

## 2019-01-02 MED ORDER — HEPARIN SOD (PORK) LOCK FLUSH 100 UNIT/ML IV SOLN
500.0000 [IU] | Freq: Once | INTRAVENOUS | Status: AC
  Administered 2019-01-02: 500 [IU] via INTRAVENOUS

## 2019-01-06 DIAGNOSIS — Z85828 Personal history of other malignant neoplasm of skin: Secondary | ICD-10-CM | POA: Diagnosis not present

## 2019-01-06 DIAGNOSIS — L821 Other seborrheic keratosis: Secondary | ICD-10-CM | POA: Diagnosis not present

## 2019-01-06 DIAGNOSIS — L814 Other melanin hyperpigmentation: Secondary | ICD-10-CM | POA: Diagnosis not present

## 2019-01-06 DIAGNOSIS — L570 Actinic keratosis: Secondary | ICD-10-CM | POA: Diagnosis not present

## 2019-01-06 DIAGNOSIS — Z8619 Personal history of other infectious and parasitic diseases: Secondary | ICD-10-CM | POA: Diagnosis not present

## 2019-01-06 DIAGNOSIS — B009 Herpesviral infection, unspecified: Secondary | ICD-10-CM | POA: Diagnosis not present

## 2019-01-06 DIAGNOSIS — D1801 Hemangioma of skin and subcutaneous tissue: Secondary | ICD-10-CM | POA: Diagnosis not present

## 2019-01-08 ENCOUNTER — Inpatient Hospital Stay: Payer: Medicare HMO | Attending: Oncology

## 2019-01-08 DIAGNOSIS — K5900 Constipation, unspecified: Secondary | ICD-10-CM | POA: Insufficient documentation

## 2019-01-08 DIAGNOSIS — Z803 Family history of malignant neoplasm of breast: Secondary | ICD-10-CM | POA: Insufficient documentation

## 2019-01-08 DIAGNOSIS — Z79899 Other long term (current) drug therapy: Secondary | ICD-10-CM | POA: Insufficient documentation

## 2019-01-08 DIAGNOSIS — D509 Iron deficiency anemia, unspecified: Secondary | ICD-10-CM | POA: Insufficient documentation

## 2019-01-08 DIAGNOSIS — K219 Gastro-esophageal reflux disease without esophagitis: Secondary | ICD-10-CM | POA: Insufficient documentation

## 2019-01-08 DIAGNOSIS — F17210 Nicotine dependence, cigarettes, uncomplicated: Secondary | ICD-10-CM | POA: Insufficient documentation

## 2019-01-08 DIAGNOSIS — Z5111 Encounter for antineoplastic chemotherapy: Secondary | ICD-10-CM | POA: Insufficient documentation

## 2019-01-08 DIAGNOSIS — C182 Malignant neoplasm of ascending colon: Secondary | ICD-10-CM | POA: Insufficient documentation

## 2019-01-08 NOTE — Progress Notes (Signed)
Nutrition Follow-up:  Patient with colon cancer.  Patient currently on chemotherapy.    Spoke with patient via phone for nutrition follow-up.  Patient reports that she has a really good appetite.  Reports that she has not had any nutrition impact symptoms.  Reports usually eating cereal or egg sandwich for breakfast which she has never been a breakfast eater.  Will sometimes have a mid-am snack of cheese or fruit.  Lunch is soup or sandwich and dinner is meatloaf and vegetables or chicken pie or something her sister or friends bring her.  Reports that she is trying to exercise (walking).    Medications: reviewed  Labs: glucose 103  Anthropometrics:   Weight 132 lb increased from 130 lb  NUTRITION DIAGNOSIS:  Increased nutrient needs continue during treatment    INTERVENTION:  Encouraged patient to continue with well balanced diet including good sources of protein.  Patient has contact information and knows to contact me if has questions.    MONITORING, EVALUATION, GOAL: Patient will consume adequate calories and protein to maintain weight during treatment   NEXT VISIT: phone f/u Thursday, Nov 19th  Braydan Marriott B. Zenia Resides, [place], [place] Registered Dietitian [phone] (pager)

## 2019-01-12 ENCOUNTER — Telehealth: Payer: Self-pay | Admitting: *Deleted

## 2019-01-12 DIAGNOSIS — Z9189 Other specified personal risk factors, not elsewhere classified: Secondary | ICD-10-CM

## 2019-01-12 NOTE — Telephone Encounter (Signed)
Please arrange patient to be tested for COVID19, and post pone chemo until test is back. Thank  you

## 2019-01-12 NOTE — Telephone Encounter (Signed)
Patient has been exposed to [place] by hugging her great granddaughter who has tested positive. She has an appointment for treatment Wednesday. Please advise what she should do about her appointment. She is not currently having symptoms.

## 2019-01-13 NOTE — Telephone Encounter (Signed)
Patient scheduled for COVID testing on 01/14/19 @ 9:00

## 2019-01-14 ENCOUNTER — Other Ambulatory Visit: Payer: Medicare HMO

## 2019-01-14 ENCOUNTER — Other Ambulatory Visit: Payer: Self-pay | Admitting: *Deleted

## 2019-01-14 ENCOUNTER — Ambulatory Visit: Payer: Medicare HMO

## 2019-01-14 ENCOUNTER — Ambulatory Visit: Payer: Medicare HMO | Admitting: Oncology

## 2019-01-14 ENCOUNTER — Encounter: Payer: Medicare HMO | Admitting: Hospice and Palliative Medicine

## 2019-01-14 DIAGNOSIS — Z20822 Contact with and (suspected) exposure to covid-19: Secondary | ICD-10-CM

## 2019-01-14 DIAGNOSIS — Z20828 Contact with and (suspected) exposure to other viral communicable diseases: Secondary | ICD-10-CM | POA: Diagnosis not present

## 2019-01-16 DIAGNOSIS — C182 Malignant neoplasm of ascending colon: Secondary | ICD-10-CM | POA: Diagnosis not present

## 2019-01-16 LAB — NOVEL CORONAVIRUS, NAA: SARS-CoV-2, NAA: NOT DETECTED

## 2019-01-16 NOTE — Telephone Encounter (Signed)
Called pt. to get her r/s for lab/MD/67f patient asked to skip next week and ask if she could start the back on 01/28/19 And pump dc on 10/23  Please Advise

## 2019-01-16 NOTE — Telephone Encounter (Signed)
COVID test result is negative.  Please r/s the 01/14/19 and 01/16/19 appts that were cancelled.  Please inform patient of new appts.  Thanks!

## 2019-01-17 NOTE — Telephone Encounter (Signed)
Ok if patient wants that way. Please reschedule. Thanks.

## 2019-01-21 ENCOUNTER — Inpatient Hospital Stay: Payer: Medicare HMO | Admitting: Oncology

## 2019-01-21 ENCOUNTER — Inpatient Hospital Stay: Payer: Medicare HMO

## 2019-01-27 ENCOUNTER — Other Ambulatory Visit: Payer: Self-pay

## 2019-01-27 NOTE — Progress Notes (Signed)
Patient pre screened for office appointment, no questions or concerns today. 

## 2019-01-28 ENCOUNTER — Other Ambulatory Visit: Payer: Self-pay

## 2019-01-28 ENCOUNTER — Inpatient Hospital Stay: Payer: Medicare HMO

## 2019-01-28 ENCOUNTER — Encounter: Payer: Self-pay | Admitting: Oncology

## 2019-01-28 ENCOUNTER — Other Ambulatory Visit: Payer: Medicare HMO

## 2019-01-28 ENCOUNTER — Inpatient Hospital Stay (HOSPITAL_BASED_OUTPATIENT_CLINIC_OR_DEPARTMENT_OTHER): Payer: Medicare HMO | Admitting: Oncology

## 2019-01-28 VITALS — BP 126/82 | HR 81 | Temp 97.3°F | Resp 18 | Wt 132.2 lb

## 2019-01-28 DIAGNOSIS — Z809 Family history of malignant neoplasm, unspecified: Secondary | ICD-10-CM

## 2019-01-28 DIAGNOSIS — R110 Nausea: Secondary | ICD-10-CM

## 2019-01-28 DIAGNOSIS — Z79899 Other long term (current) drug therapy: Secondary | ICD-10-CM | POA: Diagnosis not present

## 2019-01-28 DIAGNOSIS — Z5111 Encounter for antineoplastic chemotherapy: Secondary | ICD-10-CM

## 2019-01-28 DIAGNOSIS — C182 Malignant neoplasm of ascending colon: Secondary | ICD-10-CM

## 2019-01-28 DIAGNOSIS — K219 Gastro-esophageal reflux disease without esophagitis: Secondary | ICD-10-CM | POA: Diagnosis not present

## 2019-01-28 DIAGNOSIS — D509 Iron deficiency anemia, unspecified: Secondary | ICD-10-CM | POA: Diagnosis not present

## 2019-01-28 DIAGNOSIS — K5900 Constipation, unspecified: Secondary | ICD-10-CM | POA: Diagnosis not present

## 2019-01-28 DIAGNOSIS — R69 Illness, unspecified: Secondary | ICD-10-CM | POA: Diagnosis not present

## 2019-01-28 DIAGNOSIS — Z803 Family history of malignant neoplasm of breast: Secondary | ICD-10-CM | POA: Diagnosis not present

## 2019-01-28 LAB — CBC WITH DIFFERENTIAL/PLATELET
Abs Immature Granulocytes: 0.02 10*3/uL (ref 0.00–0.07)
Basophils Absolute: 0.1 10*3/uL (ref 0.0–0.1)
Basophils Relative: 1 %
Eosinophils Absolute: 0.2 10*3/uL (ref 0.0–0.5)
Eosinophils Relative: 3 %
HCT: 40.6 % (ref 36.0–46.0)
Hemoglobin: 13.3 g/dL (ref 12.0–15.0)
Immature Granulocytes: 0 %
Lymphocytes Relative: 23 %
Lymphs Abs: 1.3 10*3/uL (ref 0.7–4.0)
MCH: 29.2 pg (ref 26.0–34.0)
MCHC: 32.8 g/dL (ref 30.0–36.0)
MCV: 89.2 fL (ref 80.0–100.0)
Monocytes Absolute: 0.6 10*3/uL (ref 0.1–1.0)
Monocytes Relative: 11 %
Neutro Abs: 3.5 10*3/uL (ref 1.7–7.7)
Neutrophils Relative %: 62 %
Platelets: 232 10*3/uL (ref 150–400)
RBC: 4.55 MIL/uL (ref 3.87–5.11)
RDW: 18.6 % — ABNORMAL HIGH (ref 11.5–15.5)
WBC: 5.7 10*3/uL (ref 4.0–10.5)
nRBC: 0 % (ref 0.0–0.2)

## 2019-01-28 LAB — COMPREHENSIVE METABOLIC PANEL
ALT: 23 U/L (ref 0–44)
AST: 31 U/L (ref 15–41)
Albumin: 4.3 g/dL (ref 3.5–5.0)
Alkaline Phosphatase: 75 U/L (ref 38–126)
Anion gap: 8 (ref 5–15)
BUN: 12 mg/dL (ref 8–23)
CO2: 22 mmol/L (ref 22–32)
Calcium: 9.4 mg/dL (ref 8.9–10.3)
Chloride: 103 mmol/L (ref 98–111)
Creatinine, Ser: 0.73 mg/dL (ref 0.44–1.00)
GFR calc Af Amer: >60 mL/min (ref >60)
GFR calc non Af Amer: >60 mL/min (ref >60)
Glucose, Bld: 111 mg/dL — ABNORMAL HIGH (ref 70–99)
Potassium: 4 mmol/L (ref 3.5–5.1)
Sodium: 133 mmol/L — ABNORMAL LOW (ref 135–145)
Total Bilirubin: 0.7 mg/dL (ref 0.3–1.2)
Total Protein: 7.5 g/dL (ref 6.5–8.1)

## 2019-01-28 MED ORDER — SODIUM CHLORIDE 0.9 % IV SOLN
Freq: Once | INTRAVENOUS | Status: AC
  Administered 2019-01-28: 10:00:00 via INTRAVENOUS
  Filled 2019-01-28: qty 250

## 2019-01-28 MED ORDER — HEPARIN SOD (PORK) LOCK FLUSH 100 UNIT/ML IV SOLN: 500.0000 [IU] | Freq: Once | INTRAVENOUS | Status: DC

## 2019-01-28 MED ORDER — FLUOROURACIL CHEMO INJECTION 2.5 GM/50ML
400.0000 mg/m2 | Freq: Once | INTRAVENOUS | Status: AC
  Administered 2019-01-28: 700 mg via INTRAVENOUS
  Filled 2019-01-28: qty 14

## 2019-01-28 MED ORDER — SODIUM CHLORIDE 0.9 % IV SOLN
2400.0000 mg/m2 | INTRAVENOUS | Status: DC
  Administered 2019-01-28: 4150 mg via INTRAVENOUS
  Filled 2019-01-28: qty 83

## 2019-01-28 MED ORDER — SODIUM CHLORIDE 0.9% FLUSH
10.0000 mL | INTRAVENOUS | Status: DC | PRN
  Administered 2019-01-28: 10 mL via INTRAVENOUS
  Filled 2019-01-28: qty 10

## 2019-01-28 MED ORDER — LEUCOVORIN CALCIUM INJECTION 100 MG
20.0000 mg/m2 | Freq: Once | INTRAMUSCULAR | Status: AC
  Administered 2019-01-28: 34 mg via INTRAVENOUS
  Filled 2019-01-28: qty 1.7

## 2019-01-28 MED ORDER — PROCHLORPERAZINE MALEATE 10 MG PO TABS
10.0000 mg | ORAL_TABLET | Freq: Once | ORAL | Status: AC
  Administered 2019-01-28: 10 mg via ORAL
  Filled 2019-01-28: qty 1

## 2019-01-28 NOTE — Progress Notes (Signed)
Patient is concerned about a sore that is on her left hand.

## 2019-01-28 NOTE — Progress Notes (Signed)
Hematology/Oncology  [facility] Telephone:([phone] [phone]   Patient Care Team: [name], MD as PCP - General (Internal Medicine) Clent Jacks, RN as Oncology Nurse Navigator  REFERRING PROVIDER: [name], MD  CHIEF COMPLAINTS/REASON FOR VISIT:  Follow-up for management of stage III colon cancer,  iron deficiency, lung nodules HISTORY OF PRESENTING ILLNESS:   Shirleyann Deschamps is a  78 y.o.  female with PMH listed below was seen in consultation at the request of  [name], MD  for evaluation of colon cancer Patient had acute cholecystitis and had laparoscopic cholecystectomy same day by Dr. Peyton Najjar. Patient was found to have iron deficiency anemia with hemoglobin around 9.2, ferritin 7 and iron 24. Baseline hemoglobin 1 year ago was 13.1.  Patient was started on iron supplementation with Ferrex 150 mg iron capsule daily. Reports to have chronic constipation.  Denies any blood in the stool. Patient was seen by gastroenterology and had endoscopy and colonoscopy done on 10/06/2018. There was a partially obstructing mass in the cecum which was biopsied. 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 Gastric mucosa shows reactive foveolar hyperplasia, stromal fibrosis and focal chronic inflammation.  Negative for H. pylori.  Duodenal biopsy shows no abnormalities.  No evidence of celiac disease.  Sigmoid polyps showed tubular adenoma. 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 10 pound weight loss within past few months. Denies any family history colon cancer.  Paternal grandmother passed away due to breast cancer. Per patient's request, I called patient's son [name] and sister [name], and put both of them on speaker phone so they can hear the conversation and participate in discussion.  # 10/23/2018, she underwent right hemicolectomy Pathology showed invasive moderately  differentiated adenocarcinoma involving cecum and adjacent terminal ileum with extension into pericecal adipose tissue and focally to inked serosal surface. Invasive moderately differentiated adenocarcinoma involving proximal ascending colon and ileocecal valve with extension into pericolonic adipose tissue. Tubular adenoma, sessile serrated polyp in the cecum, tubular adenoma sessile serrated polyp in the ascending colon.  Grade 2, tumor invades visceral peritoneum, all margins are negative. Pathological staging pT4a pN1c  #Iron deficiency anemia, status post IV Venofer treatments.   INTERVAL HISTORY Reianna Derosa is a 78 y.o. female who has above history reviewed by me today presents for follow up visit for management of stage III right colon cancer, iron deficiency, lung nodules Status post 2 cycles of adjuvant 5-FU treatments. She was exposed to Covid and had to delay cycle 3 treatments. She was tested negative for Covid Earlier chemotherapy dates were offered and the patient prefers to come today to resume chemotherapy. Reports doing clinically well. No sore throat.Denies fever, chills, nausea, vomiting, diarrhea, chest pain, shortness of breath, abdominal pain, urinary symptoms, lower extremity swelling.     Review of Systems  Constitutional: Negative for appetite change, chills, fatigue and fever.  HENT:   Negative for hearing loss and voice change.   Eyes: Negative for eye problems.  Respiratory: Negative for chest tightness and cough.   Cardiovascular: Negative for chest pain.  Gastrointestinal: Negative for abdominal distention, abdominal pain, blood in stool, constipation and diarrhea.  Endocrine: Negative for hot flashes.  Genitourinary: Negative for difficulty urinating and frequency.   Musculoskeletal: Negative for arthralgias.  Skin: Negative for itching and rash.  Neurological: Negative for extremity weakness.  Hematological: Negative for adenopathy.   Psychiatric/Behavioral: Negative for confusion.    MEDICAL HISTORY:  Past Medical History:  Diagnosis Date  .  Complication of anesthesia    NOT WITH GB  . GERD (gastroesophageal reflux disease)   . IDA (iron deficiency anemia) 10/13/2018  . PONV (postoperative nausea and vomiting)     SURGICAL HISTORY: Past Surgical History:  Procedure Laterality Date  . ABDOMINAL HYSTERECTOMY     complete  . APPENDECTOMY    . CHOLECYSTECTOMY N/A 08/22/2018   Procedure: LAPAROSCOPIC CHOLECYSTECTOMY;  Surgeon: Herbert Pun, MD;  Location: [facility] ORS;  Service: General;  Laterality: N/A;  . COLONOSCOPY    . LAPAROSCOPIC RIGHT COLECTOMY N/A 10/23/2018   Procedure: LAPAROSCOPIC HAND ASSISTED RIGHT COLECTOMY;  Surgeon: Herbert Pun, MD;  Location: [facility] ORS;  Service: General;  Laterality: N/A;  . PERCUTANEOUS PINNING WRIST FRACTURE Right   . PORTACATH PLACEMENT Right 12/08/2018   Procedure: INSERTION PORT-A-CATH, CENTRAL VENOUS CATH WITH SUBCUTANEOUS INFUSION PORT;  Surgeon: Herbert Pun, MD;  Location: [facility] ORS;  Service: General;  Laterality: Right;  . WRIST FOREIGN BODY REMOVAL Right    2016    SOCIAL HISTORY: Social History   Socioeconomic History  . Marital status: Divorced    Spouse name: Not on file  . Number of children: Not on file  . Years of education: Not on file  . Highest education level: Not on file  Occupational History  . Occupation: retired  Scientific laboratory technician  . Financial resource strain: Not on file  . Food insecurity    Worry: Not on file    Inability: Not on file  . Transportation needs    Medical: Not on file    Non-medical: Not on file  Tobacco Use  . Smoking status: Current Every Day Smoker    Packs/day: 0.25    Years: 60.00    Pack years: 15.00    Types: Cigarettes  . Smokeless tobacco: Never Used  . Tobacco comment: Patient states that this is the only thing she enjoys  Substance and Sexual Activity  . Alcohol use: No  . Drug use: Not  Currently  . Sexual activity: Not Currently  Lifestyle  . Physical activity    Days per week: Not on file    Minutes per session: Not on file  . Stress: Not on file  Relationships  . Social Herbalist on phone: Not on file    Gets together: Not on file    Attends religious service: Not on file    Active member of club or organization: Not on file    Attends meetings of clubs or organizations: Not on file    Relationship status: Not on file  . Intimate partner violence    Fear of current or ex partner: Not on file    Emotionally abused: Not on file    Physically abused: Not on file    Forced sexual activity: Not on file  Other Topics Concern  . Not on file  Social History Narrative  . Not on file    FAMILY HISTORY: Family History  Problem Relation Age of Onset  . COPD Mother   . Suicidality Father   . Breast cancer Paternal Grandmother   . Failure to thrive Sister     ALLERGIES:  is allergic to pain & fever [acetaminophen]; sulfa antibiotics; codeine; and penicillins.  MEDICATIONS:  Current Outpatient Medications  Medication Sig Dispense Refill  . lidocaine-prilocaine (EMLA) cream Apply to affected area once 30 g 3  . ondansetron (ZOFRAN) 8 MG tablet Take 1 tablet (8 mg total) by mouth 2 (two) times daily as needed (Nausea  or vomiting). 30 tablet 1  . prochlorperazine (COMPAZINE) 10 MG tablet Take 1 tablet (10 mg total) by mouth every 6 (six) hours as needed (Nausea or vomiting). 30 tablet 1   No current facility-administered medications for this visit.    Facility-Administered Medications Ordered in Other Visits  Medication Dose Route Frequency Provider Last Rate Last Dose  . fluorouracil (ADRUCIL) 4,150 mg in sodium chloride 0.9 % 67 mL chemo infusion  2,400 mg/m2 (Treatment Plan Recorded) Intravenous 1 day or 1 dose [name], MD   4,150 mg at 01/28/19 1100  . heparin lock flush 100 unit/mL  500 Units Intravenous Once [name], MD      . sodium chloride  flush (NS) 0.9 % injection 10 mL  10 mL Intravenous PRN [name], MD   10 mL at 01/28/19 0913     PHYSICAL EXAMINATION: ECOG PERFORMANCE STATUS: 1 - Symptomatic but completely ambulatory Physical Exam Constitutional:      General: She is not in acute distress.    Comments: Thin, walk in   HENT:     Head: Normocephalic and atraumatic.  Eyes:     General: No scleral icterus.    Pupils: Pupils are equal, round, and reactive to light.  Neck:     Musculoskeletal: Normal range of motion and neck supple.  Cardiovascular:     Rate and Rhythm: Normal rate and regular rhythm.     Heart sounds: Normal heart sounds.  Pulmonary:     Effort: Pulmonary effort is normal. No respiratory distress.     Breath sounds: No wheezing.  Abdominal:     General: Bowel sounds are normal. There is no distension.     Palpations: Abdomen is soft. There is no mass.     Tenderness: There is no abdominal tenderness.  Musculoskeletal: Normal range of motion.        General: No deformity.  Skin:    General: Skin is warm and dry.     Coloration: Skin is not pale.     Findings: No erythema or rash.  Neurological:     Mental Status: She is alert and oriented to person, place, and time.     Cranial Nerves: No cranial nerve deficit.     Coordination: Coordination normal.  Psychiatric:        Behavior: Behavior normal.        Thought Content: Thought content normal.     LABORATORY DATA:  I have reviewed the data as listed Lab Results  Component Value Date   WBC 5.7 01/28/2019   HGB 13.3 01/28/2019   HCT 40.6 01/28/2019   MCV 89.2 01/28/2019   PLT 232 01/28/2019   Recent Labs    12/24/18 1348 12/31/18 0901 01/28/19 0901  NA 136 136 133*  K 4.1 3.9 4.0  CL 105 105 103  CO2 24 23 22   GLUCOSE 112* 103* 111*  BUN 12 15 12   CREATININE 0.63 0.67 0.73  CALCIUM 9.4 9.3 9.4  GFRNONAA >60 >60 >60  GFRAA >60 >60 >60  PROT 7.3 7.3 7.5  ALBUMIN 4.4 4.2 4.3  AST 31 22 31   ALT 28 18 23   ALKPHOS 71 70  75  BILITOT 0.4 0.5 0.7   Iron/TIBC/Ferritin/ %Sat    Component Value Date/Time   IRON 20 (L) 10/13/2018 1154   TIBC 547 (H) 10/13/2018 1154   FERRITIN 6 (L) 10/13/2018 1154   IRONPCTSAT 4 (L) 10/13/2018 1154      RADIOGRAPHIC STUDIES: I have personally  reviewed the radiological images as listed and agreed with the findings in the report. Dg Chest Port 1 View  Result Date: 12/08/2018 CLINICAL DATA:  Port placement EXAM: PORTABLE CHEST 1 VIEW COMPARISON:  10/09/2018 chest CT FINDINGS: Right internal jugular Port-A-Cath terminates in the middle third of the SVC. Normal heart size. Atherosclerotic aortic arch. Otherwise normal mediastinal contour. No pneumothorax. No pleural effusion. No overt pulmonary edema. No acute consolidative airspace disease. Emphysema. IMPRESSION: Right internal jugular Port-A-Cath terminates in the middle third of the SVC. No pneumothorax. No acute pulmonary disease. Emphysema. Electronically Signed   By: Ilona Sorrel M.D.   On: 12/08/2018 08:45   Dg C-arm 1-60 Min-no Report  Result Date: 12/08/2018 Fluoroscopy was utilized by the requesting physician.  No radiographic interpretation.      ASSESSMENT & PLAN:  1. Cancer of right colon ([place])   2. Family history of cancer   3. Nausea without vomiting   4. Encounter for antineoplastic chemotherapy   Cancer Staging Cancer of right colon [facility]) Staging form: Colon and Rectum, AJCC 8th Edition - Pathologic stage from 10/31/2018: Stage IIIB (pT4a, pN1c, cM0) - Signed by [name], MD on 10/31/2018   #Stage IIIB right colon adenocarcinoma Labs reviewed and discussed with patient. She tolerates 5-FU treatments well. Counts are acceptable to proceed with cycle 3 adjuvant 5-FU chemotherapy today.  Nausea, chemotherapy-induced, continue antiemetics as needed/as instructed Diarrhea, no additional diarrhea episodes.  Continue to monitor. Family history of cancer, recommend genetic testing.  She declined.  Follow-up  in 2 weeks for assessment prior to the next cycle of treatment. [name], MD, PhD Hematology Oncology [place] [facility] 01/28/2019

## 2019-01-30 ENCOUNTER — Inpatient Hospital Stay: Payer: Medicare HMO

## 2019-01-30 ENCOUNTER — Ambulatory Visit: Payer: Medicare HMO

## 2019-01-30 ENCOUNTER — Other Ambulatory Visit: Payer: Self-pay

## 2019-01-30 ENCOUNTER — Ambulatory Visit: Payer: Medicare HMO | Admitting: Oncology

## 2019-01-30 DIAGNOSIS — D509 Iron deficiency anemia, unspecified: Secondary | ICD-10-CM | POA: Diagnosis not present

## 2019-01-30 DIAGNOSIS — K5900 Constipation, unspecified: Secondary | ICD-10-CM | POA: Diagnosis not present

## 2019-01-30 DIAGNOSIS — K219 Gastro-esophageal reflux disease without esophagitis: Secondary | ICD-10-CM | POA: Diagnosis not present

## 2019-01-30 DIAGNOSIS — Z79899 Other long term (current) drug therapy: Secondary | ICD-10-CM | POA: Diagnosis not present

## 2019-01-30 DIAGNOSIS — Z5111 Encounter for antineoplastic chemotherapy: Secondary | ICD-10-CM | POA: Diagnosis not present

## 2019-01-30 DIAGNOSIS — C182 Malignant neoplasm of ascending colon: Secondary | ICD-10-CM | POA: Diagnosis not present

## 2019-01-30 DIAGNOSIS — R69 Illness, unspecified: Secondary | ICD-10-CM | POA: Diagnosis not present

## 2019-01-30 DIAGNOSIS — Z803 Family history of malignant neoplasm of breast: Secondary | ICD-10-CM | POA: Diagnosis not present

## 2019-01-30 MED ORDER — SODIUM CHLORIDE 0.9% FLUSH
10.0000 mL | INTRAVENOUS | Status: DC | PRN
  Administered 2019-01-30: 11:00:00 10 mL
  Filled 2019-01-30: qty 10

## 2019-01-30 MED ORDER — HEPARIN SOD (PORK) LOCK FLUSH 100 UNIT/ML IV SOLN
INTRAVENOUS | Status: AC
  Filled 2019-01-30: qty 5

## 2019-01-30 MED ORDER — HEPARIN SOD (PORK) LOCK FLUSH 100 UNIT/ML IV SOLN
500.0000 [IU] | Freq: Once | INTRAVENOUS | Status: AC | PRN
  Administered 2019-01-30: 500 [IU]

## 2019-02-10 DIAGNOSIS — C182 Malignant neoplasm of ascending colon: Secondary | ICD-10-CM | POA: Diagnosis not present

## 2019-02-11 ENCOUNTER — Other Ambulatory Visit: Payer: Self-pay

## 2019-02-11 ENCOUNTER — Inpatient Hospital Stay: Payer: Medicare HMO

## 2019-02-11 ENCOUNTER — Encounter: Payer: Self-pay | Admitting: Oncology

## 2019-02-11 ENCOUNTER — Inpatient Hospital Stay: Payer: Medicare HMO | Attending: Oncology

## 2019-02-11 ENCOUNTER — Inpatient Hospital Stay (HOSPITAL_BASED_OUTPATIENT_CLINIC_OR_DEPARTMENT_OTHER): Payer: Medicare HMO | Admitting: Oncology

## 2019-02-11 VITALS — BP 153/85 | HR 80 | Temp 96.8°F | Resp 18 | Wt 122.4 lb

## 2019-02-11 DIAGNOSIS — K5909 Other constipation: Secondary | ICD-10-CM | POA: Diagnosis not present

## 2019-02-11 DIAGNOSIS — E871 Hypo-osmolality and hyponatremia: Secondary | ICD-10-CM | POA: Insufficient documentation

## 2019-02-11 DIAGNOSIS — K219 Gastro-esophageal reflux disease without esophagitis: Secondary | ICD-10-CM | POA: Diagnosis not present

## 2019-02-11 DIAGNOSIS — C182 Malignant neoplasm of ascending colon: Secondary | ICD-10-CM

## 2019-02-11 DIAGNOSIS — Z809 Family history of malignant neoplasm, unspecified: Secondary | ICD-10-CM

## 2019-02-11 DIAGNOSIS — D509 Iron deficiency anemia, unspecified: Secondary | ICD-10-CM | POA: Insufficient documentation

## 2019-02-11 DIAGNOSIS — R110 Nausea: Secondary | ICD-10-CM | POA: Insufficient documentation

## 2019-02-11 DIAGNOSIS — Z5111 Encounter for antineoplastic chemotherapy: Secondary | ICD-10-CM | POA: Diagnosis not present

## 2019-02-11 DIAGNOSIS — R69 Illness, unspecified: Secondary | ICD-10-CM | POA: Diagnosis not present

## 2019-02-11 DIAGNOSIS — Z79899 Other long term (current) drug therapy: Secondary | ICD-10-CM | POA: Diagnosis not present

## 2019-02-11 DIAGNOSIS — F17210 Nicotine dependence, cigarettes, uncomplicated: Secondary | ICD-10-CM | POA: Insufficient documentation

## 2019-02-11 DIAGNOSIS — Z95828 Presence of other vascular implants and grafts: Secondary | ICD-10-CM

## 2019-02-11 DIAGNOSIS — J439 Emphysema, unspecified: Secondary | ICD-10-CM | POA: Diagnosis not present

## 2019-02-11 LAB — COMPREHENSIVE METABOLIC PANEL
ALT: 15 U/L (ref 0–44)
AST: 19 U/L (ref 15–41)
Albumin: 4.3 g/dL (ref 3.5–5.0)
Alkaline Phosphatase: 75 U/L (ref 38–126)
Anion gap: 7 (ref 5–15)
BUN: 11 mg/dL (ref 8–23)
CO2: 23 mmol/L (ref 22–32)
Calcium: 9.3 mg/dL (ref 8.9–10.3)
Chloride: 106 mmol/L (ref 98–111)
Creatinine, Ser: 0.65 mg/dL (ref 0.44–1.00)
GFR calc Af Amer: >60 mL/min (ref >60)
GFR calc non Af Amer: >60 mL/min (ref >60)
Glucose, Bld: 105 mg/dL — ABNORMAL HIGH (ref 70–99)
Potassium: 3.9 mmol/L (ref 3.5–5.1)
Sodium: 136 mmol/L (ref 135–145)
Total Bilirubin: 0.6 mg/dL (ref 0.3–1.2)
Total Protein: 7.2 g/dL (ref 6.5–8.1)

## 2019-02-11 LAB — CBC WITH DIFFERENTIAL/PLATELET
Abs Immature Granulocytes: 0.02 10*3/uL (ref 0.00–0.07)
Basophils Absolute: 0.1 10*3/uL (ref 0.0–0.1)
Basophils Relative: 1 %
Eosinophils Absolute: 0.3 10*3/uL (ref 0.0–0.5)
Eosinophils Relative: 5 %
HCT: 38.5 % (ref 36.0–46.0)
Hemoglobin: 12.7 g/dL (ref 12.0–15.0)
Immature Granulocytes: 0 %
Lymphocytes Relative: 24 %
Lymphs Abs: 1.2 10*3/uL (ref 0.7–4.0)
MCH: 29.9 pg (ref 26.0–34.0)
MCHC: 33 g/dL (ref 30.0–36.0)
MCV: 90.6 fL (ref 80.0–100.0)
Monocytes Absolute: 0.6 10*3/uL (ref 0.1–1.0)
Monocytes Relative: 11 %
Neutro Abs: 3 10*3/uL (ref 1.7–7.7)
Neutrophils Relative %: 59 %
Platelets: 200 10*3/uL (ref 150–400)
RBC: 4.25 MIL/uL (ref 3.87–5.11)
RDW: 17.2 % — ABNORMAL HIGH (ref 11.5–15.5)
WBC: 5.2 10*3/uL (ref 4.0–10.5)
nRBC: 0 % (ref 0.0–0.2)

## 2019-02-11 MED ORDER — LEUCOVORIN CALCIUM INJECTION 100 MG: 20.0000 mg/m2 | Freq: Once | INTRAMUSCULAR | Status: DC

## 2019-02-11 MED ORDER — SODIUM CHLORIDE 0.9% FLUSH
10.0000 mL | Freq: Once | INTRAVENOUS | Status: AC
  Administered 2019-02-11: 10 mL via INTRAVENOUS
  Filled 2019-02-11: qty 10

## 2019-02-11 MED ORDER — LEUCOVORIN CALCIUM INJECTION 350 MG
700.0000 mg | Freq: Once | INTRAVENOUS | Status: AC
  Administered 2019-02-11: 700 mg via INTRAVENOUS
  Filled 2019-02-11: qty 25

## 2019-02-11 MED ORDER — PROCHLORPERAZINE MALEATE 10 MG PO TABS
10.0000 mg | ORAL_TABLET | Freq: Once | ORAL | Status: AC
  Administered 2019-02-11: 10 mg via ORAL
  Filled 2019-02-11: qty 1

## 2019-02-11 MED ORDER — SODIUM CHLORIDE 0.9 % IV SOLN
Freq: Once | INTRAVENOUS | Status: AC
  Administered 2019-02-11: 10:00:00 via INTRAVENOUS
  Filled 2019-02-11: qty 250

## 2019-02-11 MED ORDER — FLUOROURACIL CHEMO INJECTION 2.5 GM/50ML
400.0000 mg/m2 | Freq: Once | INTRAVENOUS | Status: AC
  Administered 2019-02-11: 700 mg via INTRAVENOUS
  Filled 2019-02-11: qty 14

## 2019-02-11 MED ORDER — SODIUM CHLORIDE 0.9 % IV SOLN
2400.0000 mg/m2 | INTRAVENOUS | Status: DC
  Administered 2019-02-11: 4150 mg via INTRAVENOUS
  Filled 2019-02-11: qty 83

## 2019-02-11 NOTE — Progress Notes (Signed)
Hematology/Oncology  [facility] Telephone:([phone] [phone]   Patient Care Team: [name], MD as PCP - General (Internal Medicine) Clent Jacks, RN as Oncology Nurse Navigator  REFERRING PROVIDER: [name], MD  CHIEF COMPLAINTS/REASON FOR VISIT:  Follow-up for management of stage III colon cancer,  iron deficiency, lung nodules HISTORY OF PRESENTING ILLNESS:   Mary Beltran is a  78 y.o.  female with PMH listed below was seen in consultation at the request of  [name], MD  for evaluation of colon cancer Patient had acute cholecystitis and had laparoscopic cholecystectomy same day by Dr. Peyton Najjar. Patient was found to have iron deficiency anemia with hemoglobin around 9.2, ferritin 7 and iron 24. Baseline hemoglobin 1 year ago was 13.1.  Patient was started on iron supplementation with Ferrex 150 mg iron capsule daily. Reports to have chronic constipation.  Denies any blood in the stool. Patient was seen by gastroenterology and had endoscopy and colonoscopy done on 10/06/2018. There was a partially obstructing mass in the cecum which was biopsied. 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 Gastric mucosa shows reactive foveolar hyperplasia, stromal fibrosis and focal chronic inflammation.  Negative for H. pylori.  Duodenal biopsy shows no abnormalities.  No evidence of celiac disease.  Sigmoid polyps showed tubular adenoma. 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 10 pound weight loss within past few months. Denies any family history colon cancer.  Paternal grandmother passed away due to breast cancer. Per patient's request, I called patient's son [name] and sister [name], and put both of them on speaker phone so they can hear the conversation and participate in discussion.  # 10/23/2018, she underwent right hemicolectomy Pathology showed invasive moderately  differentiated adenocarcinoma involving cecum and adjacent terminal ileum with extension into pericecal adipose tissue and focally to inked serosal surface. Invasive moderately differentiated adenocarcinoma involving proximal ascending colon and ileocecal valve with extension into pericolonic adipose tissue. Tubular adenoma, sessile serrated polyp in the cecum, tubular adenoma sessile serrated polyp in the ascending colon.  Grade 2, tumor invades visceral peritoneum, all margins are negative. Pathological staging pT4a pN1c  #Iron deficiency anemia, status post IV Venofer treatments.   INTERVAL HISTORY Mary Beltran is a 78 y.o. female who has above history reviewed by me today presents for follow up visit for management of stage III right colon cancer, iron deficiency, lung nodules Status post 2 cycles of adjuvant 5-FU treatments. Patient has been on adjuvant 5-FU treatment.  Tolerating well. Today she reports feeling well. Denies any nausea, vomiting, fever, chills,.   She feels more tired and fatigued for a few days after the chemotherapy, then recovers Otherwise doing well.  Appetite is fair.  Weight is stable.    Review of Systems  Constitutional: Positive for fatigue. Negative for appetite change, chills and fever.  HENT:   Negative for hearing loss and voice change.   Eyes: Negative for eye problems.  Respiratory: Negative for chest tightness and cough.   Cardiovascular: Negative for chest pain.  Gastrointestinal: Negative for abdominal distention, abdominal pain, blood in stool, constipation and diarrhea.  Endocrine: Negative for hot flashes.  Genitourinary: Negative for difficulty urinating and frequency.   Musculoskeletal: Negative for arthralgias.  Skin: Negative for itching and rash.  Neurological: Negative for extremity weakness.  Hematological: Negative for adenopathy.  Psychiatric/Behavioral: Negative for confusion.    MEDICAL HISTORY:  Past Medical History:   Diagnosis Date  . Complication of anesthesia  NOT WITH GB  . GERD (gastroesophageal reflux disease)   . IDA (iron deficiency anemia) 10/13/2018  . PONV (postoperative nausea and vomiting)     SURGICAL HISTORY: Past Surgical History:  Procedure Laterality Date  . ABDOMINAL HYSTERECTOMY     complete  . APPENDECTOMY    . CHOLECYSTECTOMY N/A 08/22/2018   Procedure: LAPAROSCOPIC CHOLECYSTECTOMY;  Surgeon: Herbert Pun, MD;  Location: [facility] ORS;  Service: General;  Laterality: N/A;  . COLONOSCOPY    . LAPAROSCOPIC RIGHT COLECTOMY N/A 10/23/2018   Procedure: LAPAROSCOPIC HAND ASSISTED RIGHT COLECTOMY;  Surgeon: Herbert Pun, MD;  Location: [facility] ORS;  Service: General;  Laterality: N/A;  . PERCUTANEOUS PINNING WRIST FRACTURE Right   . PORTACATH PLACEMENT Right 12/08/2018   Procedure: INSERTION PORT-A-CATH, CENTRAL VENOUS CATH WITH SUBCUTANEOUS INFUSION PORT;  Surgeon: Herbert Pun, MD;  Location: [facility] ORS;  Service: General;  Laterality: Right;  . WRIST FOREIGN BODY REMOVAL Right    2016    SOCIAL HISTORY: Social History   Socioeconomic History  . Marital status: Divorced    Spouse name: Not on file  . Number of children: Not on file  . Years of education: Not on file  . Highest education level: Not on file  Occupational History  . Occupation: retired  Scientific laboratory technician  . Financial resource strain: Not on file  . Food insecurity    Worry: Not on file    Inability: Not on file  . Transportation needs    Medical: Not on file    Non-medical: Not on file  Tobacco Use  . Smoking status: Current Every Day Smoker    Packs/day: 0.25    Years: 60.00    Pack years: 15.00    Types: Cigarettes  . Smokeless tobacco: Never Used  . Tobacco comment: Patient states that this is the only thing she enjoys  Substance and Sexual Activity  . Alcohol use: No  . Drug use: Not Currently  . Sexual activity: Not Currently  Lifestyle  . Physical activity    Days per week:  Not on file    Minutes per session: Not on file  . Stress: Not on file  Relationships  . Social Herbalist on phone: Not on file    Gets together: Not on file    Attends religious service: Not on file    Active member of club or organization: Not on file    Attends meetings of clubs or organizations: Not on file    Relationship status: Not on file  . Intimate partner violence    Fear of current or ex partner: Not on file    Emotionally abused: Not on file    Physically abused: Not on file    Forced sexual activity: Not on file  Other Topics Concern  . Not on file  Social History Narrative  . Not on file    FAMILY HISTORY: Family History  Problem Relation Age of Onset  . COPD Mother   . Suicidality Father   . Breast cancer Paternal Grandmother   . Failure to thrive Sister     ALLERGIES:  is allergic to pain & fever [acetaminophen]; sulfa antibiotics; codeine; and penicillins.  MEDICATIONS:  Current Outpatient Medications  Medication Sig Dispense Refill  . lidocaine-prilocaine (EMLA) cream Apply to affected area once 30 g 3  . ondansetron (ZOFRAN) 8 MG tablet Take 1 tablet (8 mg total) by mouth 2 (two) times daily as needed (Nausea or vomiting). 30 tablet 1  .  prochlorperazine (COMPAZINE) 10 MG tablet Take 1 tablet (10 mg total) by mouth every 6 (six) hours as needed (Nausea or vomiting). 30 tablet 1   No current facility-administered medications for this visit.      PHYSICAL EXAMINATION: ECOG PERFORMANCE STATUS: 1 - Symptomatic but completely ambulatory Physical Exam Constitutional:      General: She is not in acute distress.    Comments: Thin, walk in   HENT:     Head: Normocephalic and atraumatic.  Eyes:     General: No scleral icterus.    Pupils: Pupils are equal, round, and reactive to light.  Neck:     Musculoskeletal: Normal range of motion and neck supple.  Cardiovascular:     Rate and Rhythm: Normal rate and regular rhythm.     Heart sounds:  Normal heart sounds.  Pulmonary:     Effort: Pulmonary effort is normal. No respiratory distress.     Breath sounds: No wheezing.  Abdominal:     General: Bowel sounds are normal. There is no distension.     Palpations: Abdomen is soft. There is no mass.     Tenderness: There is no abdominal tenderness.  Musculoskeletal: Normal range of motion.        General: No deformity.  Skin:    General: Skin is warm and dry.     Coloration: Skin is not pale.     Findings: No erythema or rash.  Neurological:     Mental Status: She is alert and oriented to person, place, and time.     Cranial Nerves: No cranial nerve deficit.     Coordination: Coordination normal.  Psychiatric:        Behavior: Behavior normal.        Thought Content: Thought content normal.     LABORATORY DATA:  I have reviewed the data as listed Lab Results  Component Value Date   WBC 5.2 02/11/2019   HGB 12.7 02/11/2019   HCT 38.5 02/11/2019   MCV 90.6 02/11/2019   PLT 200 02/11/2019   Recent Labs    12/31/18 0901 01/28/19 0901 02/11/19 0920  NA 136 133* 136  K 3.9 4.0 3.9  CL 105 103 106  CO2 23 22 23   GLUCOSE 103* 111* 105*  BUN 15 12 11   CREATININE 0.67 0.73 0.65  CALCIUM 9.3 9.4 9.3  GFRNONAA >60 >60 >60  GFRAA >60 >60 >60  PROT 7.3 7.5 7.2  ALBUMIN 4.2 4.3 4.3  AST 22 31 19   ALT 18 23 15   ALKPHOS 70 75 75  BILITOT 0.5 0.7 0.6   Iron/TIBC/Ferritin/ %Sat    Component Value Date/Time   IRON 20 (L) 10/13/2018 1154   TIBC 547 (H) 10/13/2018 1154   FERRITIN 6 (L) 10/13/2018 1154   IRONPCTSAT 4 (L) 10/13/2018 1154      RADIOGRAPHIC STUDIES: I have personally reviewed the radiological images as listed and agreed with the findings in the report. Dg Chest Port 1 View  Result Date: 12/08/2018 CLINICAL DATA:  Port placement EXAM: PORTABLE CHEST 1 VIEW COMPARISON:  10/09/2018 chest CT FINDINGS: Right internal jugular Port-A-Cath terminates in the middle third of the SVC. Normal heart size.  Atherosclerotic aortic arch. Otherwise normal mediastinal contour. No pneumothorax. No pleural effusion. No overt pulmonary edema. No acute consolidative airspace disease. Emphysema. IMPRESSION: Right internal jugular Port-A-Cath terminates in the middle third of the SVC. No pneumothorax. No acute pulmonary disease. Emphysema. Electronically Signed   By: Janina Mayo.D.  On: 12/08/2018 08:45   Dg C-arm 1-60 Min-no Report  Result Date: 12/08/2018 Fluoroscopy was utilized by the requesting physician.  No radiographic interpretation.      ASSESSMENT & PLAN:  1. Cancer of right colon ([place])   2. Encounter for antineoplastic chemotherapy   3. Family history of cancer   Cancer Staging Cancer of right colon [facility]) Staging form: Colon and Rectum, AJCC 8th Edition - Pathologic stage from 10/31/2018: Stage IIIB (pT4a, pN1c, cM0) - Signed by [name], MD on 10/31/2018   #Stage IIIB right colon adenocarcinoma Patient tolerates 5-FU treatments well.  labs are reviewed and discussed with patient.  Counts are acceptable to proceed with cycle 4 adjuvant 5-FU chemotherapy today.  #Nausea, chemotherapy-induced, continue antiemetics.  Symptoms are well controlled.   #Diarrhea, no additional diarrhea episodes.  Continue to monitor.  #Fatigue, likely secondary to chemotherapy.  Monitor. #Family history of cancer, declined genetic testing.  Follow-up in 2 weeks for assessment prior to the next cycle of treatment. [name], MD, PhD Hematology Oncology [place] [facility] 02/11/2019

## 2019-02-11 NOTE — Progress Notes (Signed)
Patient reports occasional loss of balance when standing from sitting.  BP today while sitting is 163/81 and standing 153/85.

## 2019-02-13 ENCOUNTER — Inpatient Hospital Stay: Payer: Medicare HMO

## 2019-02-13 ENCOUNTER — Other Ambulatory Visit: Payer: Self-pay

## 2019-02-13 DIAGNOSIS — K219 Gastro-esophageal reflux disease without esophagitis: Secondary | ICD-10-CM | POA: Diagnosis not present

## 2019-02-13 DIAGNOSIS — E871 Hypo-osmolality and hyponatremia: Secondary | ICD-10-CM | POA: Diagnosis not present

## 2019-02-13 DIAGNOSIS — Z5111 Encounter for antineoplastic chemotherapy: Secondary | ICD-10-CM | POA: Diagnosis not present

## 2019-02-13 DIAGNOSIS — J439 Emphysema, unspecified: Secondary | ICD-10-CM | POA: Diagnosis not present

## 2019-02-13 DIAGNOSIS — C182 Malignant neoplasm of ascending colon: Secondary | ICD-10-CM | POA: Diagnosis not present

## 2019-02-13 DIAGNOSIS — R110 Nausea: Secondary | ICD-10-CM | POA: Diagnosis not present

## 2019-02-13 DIAGNOSIS — R69 Illness, unspecified: Secondary | ICD-10-CM | POA: Diagnosis not present

## 2019-02-13 DIAGNOSIS — D509 Iron deficiency anemia, unspecified: Secondary | ICD-10-CM | POA: Diagnosis not present

## 2019-02-13 DIAGNOSIS — K5909 Other constipation: Secondary | ICD-10-CM | POA: Diagnosis not present

## 2019-02-13 DIAGNOSIS — Z79899 Other long term (current) drug therapy: Secondary | ICD-10-CM | POA: Diagnosis not present

## 2019-02-13 MED ORDER — HEPARIN SOD (PORK) LOCK FLUSH 100 UNIT/ML IV SOLN
500.0000 [IU] | Freq: Once | INTRAVENOUS | Status: AC | PRN
  Administered 2019-02-13: 11:00:00 500 [IU]

## 2019-02-16 DIAGNOSIS — C182 Malignant neoplasm of ascending colon: Secondary | ICD-10-CM | POA: Diagnosis not present

## 2019-02-25 ENCOUNTER — Other Ambulatory Visit: Payer: Self-pay

## 2019-02-25 ENCOUNTER — Inpatient Hospital Stay: Payer: Medicare HMO

## 2019-02-25 ENCOUNTER — Inpatient Hospital Stay (HOSPITAL_BASED_OUTPATIENT_CLINIC_OR_DEPARTMENT_OTHER): Payer: Medicare HMO | Admitting: Oncology

## 2019-02-25 ENCOUNTER — Encounter: Payer: Self-pay | Admitting: Oncology

## 2019-02-25 VITALS — BP 126/77 | HR 77 | Temp 96.9°F | Resp 16 | Wt 131.4 lb

## 2019-02-25 DIAGNOSIS — Z5111 Encounter for antineoplastic chemotherapy: Secondary | ICD-10-CM | POA: Diagnosis not present

## 2019-02-25 DIAGNOSIS — C182 Malignant neoplasm of ascending colon: Secondary | ICD-10-CM

## 2019-02-25 DIAGNOSIS — R110 Nausea: Secondary | ICD-10-CM | POA: Diagnosis not present

## 2019-02-25 DIAGNOSIS — K5909 Other constipation: Secondary | ICD-10-CM | POA: Diagnosis not present

## 2019-02-25 DIAGNOSIS — E871 Hypo-osmolality and hyponatremia: Secondary | ICD-10-CM | POA: Diagnosis not present

## 2019-02-25 DIAGNOSIS — J439 Emphysema, unspecified: Secondary | ICD-10-CM | POA: Diagnosis not present

## 2019-02-25 DIAGNOSIS — R69 Illness, unspecified: Secondary | ICD-10-CM | POA: Diagnosis not present

## 2019-02-25 DIAGNOSIS — Z809 Family history of malignant neoplasm, unspecified: Secondary | ICD-10-CM | POA: Diagnosis not present

## 2019-02-25 DIAGNOSIS — K219 Gastro-esophageal reflux disease without esophagitis: Secondary | ICD-10-CM | POA: Diagnosis not present

## 2019-02-25 DIAGNOSIS — Z79899 Other long term (current) drug therapy: Secondary | ICD-10-CM | POA: Diagnosis not present

## 2019-02-25 DIAGNOSIS — D509 Iron deficiency anemia, unspecified: Secondary | ICD-10-CM | POA: Diagnosis not present

## 2019-02-25 LAB — CBC WITH DIFFERENTIAL/PLATELET
Abs Immature Granulocytes: 0.02 10*3/uL (ref 0.00–0.07)
Basophils Absolute: 0.1 10*3/uL (ref 0.0–0.1)
Basophils Relative: 1 %
Eosinophils Absolute: 0.2 10*3/uL (ref 0.0–0.5)
Eosinophils Relative: 5 %
HCT: 36.9 % (ref 36.0–46.0)
Hemoglobin: 12.3 g/dL (ref 12.0–15.0)
Immature Granulocytes: 0 %
Lymphocytes Relative: 26 %
Lymphs Abs: 1.2 10*3/uL (ref 0.7–4.0)
MCH: 30.8 pg (ref 26.0–34.0)
MCHC: 33.3 g/dL (ref 30.0–36.0)
MCV: 92.3 fL (ref 80.0–100.0)
Monocytes Absolute: 0.7 10*3/uL (ref 0.1–1.0)
Monocytes Relative: 15 %
Neutro Abs: 2.4 10*3/uL (ref 1.7–7.7)
Neutrophils Relative %: 53 %
Platelets: 169 10*3/uL (ref 150–400)
RBC: 4 MIL/uL (ref 3.87–5.11)
RDW: 16.9 % — ABNORMAL HIGH (ref 11.5–15.5)
WBC: 4.6 10*3/uL (ref 4.0–10.5)
nRBC: 0 % (ref 0.0–0.2)

## 2019-02-25 LAB — COMPREHENSIVE METABOLIC PANEL
ALT: 19 U/L (ref 0–44)
AST: 23 U/L (ref 15–41)
Albumin: 4 g/dL (ref 3.5–5.0)
Alkaline Phosphatase: 77 U/L (ref 38–126)
Anion gap: 6 (ref 5–15)
BUN: 10 mg/dL (ref 8–23)
CO2: 22 mmol/L (ref 22–32)
Calcium: 8.9 mg/dL (ref 8.9–10.3)
Chloride: 103 mmol/L (ref 98–111)
Creatinine, Ser: 0.69 mg/dL (ref 0.44–1.00)
GFR calc Af Amer: >60 mL/min (ref >60)
GFR calc non Af Amer: >60 mL/min (ref >60)
Glucose, Bld: 98 mg/dL (ref 70–99)
Potassium: 4 mmol/L (ref 3.5–5.1)
Sodium: 131 mmol/L — ABNORMAL LOW (ref 135–145)
Total Bilirubin: 0.8 mg/dL (ref 0.3–1.2)
Total Protein: 7 g/dL (ref 6.5–8.1)

## 2019-02-25 MED ORDER — SODIUM CHLORIDE 0.9% FLUSH
10.0000 mL | INTRAVENOUS | Status: DC | PRN
  Filled 2019-02-25: qty 10

## 2019-02-25 MED ORDER — SODIUM CHLORIDE 0.9 % IV SOLN
Freq: Once | INTRAVENOUS | Status: AC
  Administered 2019-02-25: 10:00:00 via INTRAVENOUS
  Filled 2019-02-25: qty 250

## 2019-02-25 MED ORDER — PROCHLORPERAZINE MALEATE 10 MG PO TABS
10.0000 mg | ORAL_TABLET | Freq: Once | ORAL | Status: AC
  Administered 2019-02-25: 10 mg via ORAL
  Filled 2019-02-25: qty 1

## 2019-02-25 MED ORDER — FLUOROURACIL CHEMO INJECTION 2.5 GM/50ML
425.0000 mg/m2 | Freq: Once | INTRAVENOUS | Status: AC
  Administered 2019-02-25: 700 mg via INTRAVENOUS
  Filled 2019-02-25: qty 14

## 2019-02-25 MED ORDER — HEPARIN SOD (PORK) LOCK FLUSH 100 UNIT/ML IV SOLN: 500.0000 [IU] | Freq: Once | INTRAVENOUS | Status: DC | PRN

## 2019-02-25 MED ORDER — SODIUM CHLORIDE 0.9 % IV SOLN
2550.0000 mg/m2 | INTRAVENOUS | Status: DC
  Administered 2019-02-25: 4150 mg via INTRAVENOUS
  Filled 2019-02-25: qty 83

## 2019-02-25 MED ORDER — LEUCOVORIN CALCIUM INJECTION 350 MG
700.0000 mg | Freq: Once | INTRAVENOUS | Status: AC
  Administered 2019-02-25: 700 mg via INTRAVENOUS
  Filled 2019-02-25: qty 35

## 2019-02-25 MED ORDER — LEUCOVORIN CALCIUM INJECTION 350 MG: 400.0000 mg/m2 | Freq: Once | INTRAVENOUS | Status: DC

## 2019-02-25 NOTE — Progress Notes (Addendum)
Hematology/Oncology  [facility] Telephone:([phone] [phone]   Patient Care Team: [name], MD as PCP - General (Internal Medicine) Clent Jacks, RN as Oncology Nurse Navigator  REFERRING PROVIDER: [name], MD  CHIEF COMPLAINTS/REASON FOR VISIT:  Follow-up for management of stage III colon cancer,  iron deficiency, lung nodules HISTORY OF PRESENTING ILLNESS:   Mary Beltran is a  78 y.o.  female with PMH listed below was seen in consultation at the request of  [name], MD  for evaluation of colon cancer Patient had acute cholecystitis and had laparoscopic cholecystectomy same day by Dr. Peyton Najjar. Patient was found to have iron deficiency anemia with hemoglobin around 9.2, ferritin 7 and iron 24. Baseline hemoglobin 1 year ago was 13.1.  Patient was started on iron supplementation with Ferrex 150 mg iron capsule daily. Reports to have chronic constipation.  Denies any blood in the stool. Patient was seen by gastroenterology and had endoscopy and colonoscopy done on 10/06/2018. There was a partially obstructing mass in the cecum which was biopsied. 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 Gastric mucosa shows reactive foveolar hyperplasia, stromal fibrosis and focal chronic inflammation.  Negative for H. pylori.  Duodenal biopsy shows no abnormalities.  No evidence of celiac disease.  Sigmoid polyps showed tubular adenoma. 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 10 pound weight loss within past few months. Denies any family history colon cancer.  Paternal grandmother passed away due to breast cancer. Per patient's request, I called patient's son [name] and sister [name], and put both of them on speaker phone so they can hear the conversation and participate in discussion.  # 10/23/2018, she underwent right hemicolectomy Pathology showed invasive moderately  differentiated adenocarcinoma involving cecum and adjacent terminal ileum with extension into pericecal adipose tissue and focally to inked serosal surface. Invasive moderately differentiated adenocarcinoma involving proximal ascending colon and ileocecal valve with extension into pericolonic adipose tissue. Tubular adenoma, sessile serrated polyp in the cecum, tubular adenoma sessile serrated polyp in the ascending colon.  Grade 2, tumor invades visceral peritoneum, all margins are negative. Pathological staging pT4a pN1c  #Iron deficiency anemia, status post IV Venofer treatments.   INTERVAL HISTORY Mary Beltran is a 78 y.o. female who has above history reviewed by me today presents for follow up visit for management of stage III right colon cancer, iron deficiency, lung nodules Status post 4 cycles of adjuvant 5-FU treatments. She reports tolerating well. Appetite is fair. Weight has been stable.   Denies any nausea, vomiting, fever or chills. Slightly more fatigued and tired after chemotherapy for a few days.  Cardiovascular    Review of Systems  Constitutional: Positive for fatigue. Negative for appetite change, chills and fever.  HENT:   Negative for hearing loss and voice change.   Eyes: Negative for eye problems.  Respiratory: Negative for chest tightness and cough.   Cardiovascular: Negative for chest pain.  Gastrointestinal: Negative for abdominal distention, abdominal pain, blood in stool, constipation and diarrhea.  Endocrine: Negative for hot flashes.  Genitourinary: Negative for difficulty urinating and frequency.   Musculoskeletal: Negative for arthralgias.  Skin: Negative for itching and rash.  Neurological: Negative for extremity weakness.  Hematological: Negative for adenopathy.  Psychiatric/Behavioral: Negative for confusion.    MEDICAL HISTORY:  Past Medical History:  Diagnosis Date  . Complication of anesthesia    NOT WITH GB  . GERD (gastroesophageal  reflux disease)   . IDA (iron deficiency anemia)  10/13/2018  . PONV (postoperative nausea and vomiting)     SURGICAL HISTORY: Past Surgical History:  Procedure Laterality Date  . ABDOMINAL HYSTERECTOMY     complete  . APPENDECTOMY    . CHOLECYSTECTOMY N/A 08/22/2018   Procedure: LAPAROSCOPIC CHOLECYSTECTOMY;  Surgeon: Herbert Pun, MD;  Location: [facility] ORS;  Service: General;  Laterality: N/A;  . COLONOSCOPY    . LAPAROSCOPIC RIGHT COLECTOMY N/A 10/23/2018   Procedure: LAPAROSCOPIC HAND ASSISTED RIGHT COLECTOMY;  Surgeon: Herbert Pun, MD;  Location: [facility] ORS;  Service: General;  Laterality: N/A;  . PERCUTANEOUS PINNING WRIST FRACTURE Right   . PORTACATH PLACEMENT Right 12/08/2018   Procedure: INSERTION PORT-A-CATH, CENTRAL VENOUS CATH WITH SUBCUTANEOUS INFUSION PORT;  Surgeon: Herbert Pun, MD;  Location: [facility] ORS;  Service: General;  Laterality: Right;  . WRIST FOREIGN BODY REMOVAL Right    2016    SOCIAL HISTORY: Social History   Socioeconomic History  . Marital status: Divorced    Spouse name: Not on file  . Number of children: Not on file  . Years of education: Not on file  . Highest education level: Not on file  Occupational History  . Occupation: retired  Scientific laboratory technician  . Financial resource strain: Not on file  . Food insecurity    Worry: Not on file    Inability: Not on file  . Transportation needs    Medical: Not on file    Non-medical: Not on file  Tobacco Use  . Smoking status: Current Every Day Smoker    Packs/day: 0.25    Years: 60.00    Pack years: 15.00    Types: Cigarettes  . Smokeless tobacco: Never Used  . Tobacco comment: Patient states that this is the only thing she enjoys  Substance and Sexual Activity  . Alcohol use: No  . Drug use: Not Currently  . Sexual activity: Not Currently  Lifestyle  . Physical activity    Days per week: Not on file    Minutes per session: Not on file  . Stress: Not on file  Relationships   . Social Herbalist on phone: Not on file    Gets together: Not on file    Attends religious service: Not on file    Active member of club or organization: Not on file    Attends meetings of clubs or organizations: Not on file    Relationship status: Not on file  . Intimate partner violence    Fear of current or ex partner: Not on file    Emotionally abused: Not on file    Physically abused: Not on file    Forced sexual activity: Not on file  Other Topics Concern  . Not on file  Social History Narrative  . Not on file    FAMILY HISTORY: Family History  Problem Relation Age of Onset  . COPD Mother   . Suicidality Father   . Breast cancer Paternal Grandmother   . Failure to thrive Sister     ALLERGIES:  is allergic to pain & fever [acetaminophen]; sulfa antibiotics; codeine; and penicillins.  MEDICATIONS:  Current Outpatient Medications  Medication Sig Dispense Refill  . lidocaine-prilocaine (EMLA) cream Apply to affected area once 30 g 3  . ondansetron (ZOFRAN) 8 MG tablet Take 1 tablet (8 mg total) by mouth 2 (two) times daily as needed (Nausea or vomiting). 30 tablet 1  . prochlorperazine (COMPAZINE) 10 MG tablet Take 1 tablet (10 mg total) by mouth every 6 (  six) hours as needed (Nausea or vomiting). 30 tablet 1   No current facility-administered medications for this visit.      PHYSICAL EXAMINATION: ECOG PERFORMANCE STATUS: 1 - Symptomatic but completely ambulatory Physical Exam Constitutional:      General: She is not in acute distress.    Comments: Thin, walk in   HENT:     Head: Normocephalic and atraumatic.  Eyes:     General: No scleral icterus.    Pupils: Pupils are equal, round, and reactive to light.  Neck:     Musculoskeletal: Normal range of motion and neck supple.  Cardiovascular:     Rate and Rhythm: Normal rate and regular rhythm.     Heart sounds: Normal heart sounds.  Pulmonary:     Effort: Pulmonary effort is normal. No respiratory  distress.     Breath sounds: Wheezing present.  Abdominal:     General: Bowel sounds are normal. There is no distension.     Palpations: Abdomen is soft. There is no mass.     Tenderness: There is no abdominal tenderness.  Musculoskeletal: Normal range of motion.        General: No deformity.  Skin:    General: Skin is warm and dry.     Coloration: Skin is not pale.     Findings: No erythema or rash.  Neurological:     Mental Status: She is alert and oriented to person, place, and time.     Cranial Nerves: No cranial nerve deficit.     Coordination: Coordination normal.  Psychiatric:        Behavior: Behavior normal.        Thought Content: Thought content normal.     LABORATORY DATA:  I have reviewed the data as listed Lab Results  Component Value Date   WBC 4.6 02/25/2019   HGB 12.3 02/25/2019   HCT 36.9 02/25/2019   MCV 92.3 02/25/2019   PLT 169 02/25/2019   Recent Labs    01/28/19 0901 02/11/19 0920 02/25/19 0901  NA 133* 136 131*  K 4.0 3.9 4.0  CL 103 106 103  CO2 22 23 22   GLUCOSE 111* 105* 98  BUN 12 11 10   CREATININE 0.73 0.65 0.69  CALCIUM 9.4 9.3 8.9  GFRNONAA >60 >60 >60  GFRAA >60 >60 >60  PROT 7.5 7.2 7.0  ALBUMIN 4.3 4.3 4.0  AST 31 19 23   ALT 23 15 19   ALKPHOS 75 75 77  BILITOT 0.7 0.6 0.8   Iron/TIBC/Ferritin/ %Sat    Component Value Date/Time   IRON 20 (L) 10/13/2018 1154   TIBC 547 (H) 10/13/2018 1154   FERRITIN 6 (L) 10/13/2018 1154   IRONPCTSAT 4 (L) 10/13/2018 1154      RADIOGRAPHIC STUDIES: I have personally reviewed the radiological images as listed and agreed with the findings in the report. Dg Chest Port 1 View  Result Date: 12/08/2018 CLINICAL DATA:  Port placement EXAM: PORTABLE CHEST 1 VIEW COMPARISON:  10/09/2018 chest CT FINDINGS: Right internal jugular Port-A-Cath terminates in the middle third of the SVC. Normal heart size. Atherosclerotic aortic arch. Otherwise normal mediastinal contour. No pneumothorax. No pleural  effusion. No overt pulmonary edema. No acute consolidative airspace disease. Emphysema. IMPRESSION: Right internal jugular Port-A-Cath terminates in the middle third of the SVC. No pneumothorax. No acute pulmonary disease. Emphysema. Electronically Signed   By: Ilona Sorrel M.D.   On: 12/08/2018 08:45   Dg C-arm 1-60 Min-no Report  Result Date: 12/08/2018 Fluoroscopy  was utilized by the requesting physician.  No radiographic interpretation.      ASSESSMENT & PLAN:  1. Encounter for antineoplastic chemotherapy   2. Cancer of right colon ([place])   3. Family history of cancer   Cancer Staging Cancer of right colon [facility]) Staging form: Colon and Rectum, AJCC 8th Edition - Pathologic stage from 10/31/2018: Stage IIIB (pT4a, pN1c, cM0) - Signed by [name], MD on 10/31/2018   #Stage IIIB right colon adenocarcinoma Patient tolerates 5-FU treatments well. Labs are reviewed and discussed with patient. Counts acceptable to proceed with adjuvant 5-FU chemotherapy today.  Cycle 5.  #Nausea, chemotherapy-induced, symptom is well controlled with antiemetics. #Hyponatremia, patient reports drinking a lot of fluid.  Continue to monitor.  #Family history of cancer, declined genetic testing.  Follow-up in 2 weeks for assessment prior to the next cycle of treatment. [name], MD, PhD Hematology Oncology [facility] 02/25/2019   02/26/2019 Addendum:  Patient has expresses concerns about changes on her chemotherapy.  I have discussed with patient during her visit that chemotherapy regimen and dosage has not been changed.  Patient reports that during her last chemotherapy, RN added " a new medication" to her chemotherapy back.  Also she has noticed a change of color of one of her medication.  Previously light yellow now clear color.  She was frustrated and concerned about getting a different medication. I called her again today and reassured her.  I also discussed with  pharmacist Jacqualine Code who clarified that leucovorin was previously given as IV push, now switched to I piggyback.  When leucovorin was given in the form of IV push, it has a light yellow tint and when leucovorin is diluted in the piggyback, the color is clear.  I called the patient again and explained in details.  She also asked questions about wheezing.  During this visit, I heard wheezing on her right lower lobe.  Previously she has wheezing left lower lobe. I discussed with patient that it is common to hear bronchospasm/wheezing at the different location of her lungs.  We discussed about inhaler and patient declined. We also discussed about smoke cessation.    She is satisfied with the explanation and appreciates the call.  [name]

## 2019-02-25 NOTE — Progress Notes (Signed)
Patient does not offer any problems today.  

## 2019-02-26 ENCOUNTER — Inpatient Hospital Stay: Payer: Medicare HMO

## 2019-02-26 NOTE — Progress Notes (Signed)
Nutrition Follow-up:  Patient with colon cancer.  Patient currently on chemotherapy.    Spoke with patient via phone for nutrition follow-up.  Patient reports that she is eating fairly well.  Reports more nausea with this chemo treatment than in the past.  Has taken nausea medications this am and they have helped.  Reports that she continues to eat well overall.  "I am eating more now than I ever have in the past."  Reports that she is eating well balanced meals with protein.      Medications: reviwed  Labs: Na 131  Anthropometrics:   Weight stable at 131 lb 6.4 oz Weight has been 130-132 lb.  122 lb weight felt to be inaccurate.     NUTRITION DIAGNOSIS:  Increased nutrient needs continue during treatment    INTERVENTION:  Encouraged patient to be proactive in taking nausea medications. Patient to continue eating good sources of protein and well balance meals.   Patient has contact information    MONITORING, EVALUATION, GOAL: Patient will consume adequate calories and protein to maintain weight during treatment.     NEXT VISIT: Jan 7 phone f/u  Kahlan Engebretson B. Zenia Resides, [place], [place] Registered Dietitian [phone] (pager)

## 2019-02-27 ENCOUNTER — Other Ambulatory Visit: Payer: Self-pay

## 2019-02-27 ENCOUNTER — Inpatient Hospital Stay: Payer: Medicare HMO

## 2019-02-27 VITALS — BP 136/63 | HR 78 | Temp 97.0°F | Resp 18

## 2019-02-27 DIAGNOSIS — Z79899 Other long term (current) drug therapy: Secondary | ICD-10-CM | POA: Diagnosis not present

## 2019-02-27 DIAGNOSIS — J439 Emphysema, unspecified: Secondary | ICD-10-CM | POA: Diagnosis not present

## 2019-02-27 DIAGNOSIS — R110 Nausea: Secondary | ICD-10-CM | POA: Diagnosis not present

## 2019-02-27 DIAGNOSIS — D509 Iron deficiency anemia, unspecified: Secondary | ICD-10-CM | POA: Diagnosis not present

## 2019-02-27 DIAGNOSIS — K5909 Other constipation: Secondary | ICD-10-CM | POA: Diagnosis not present

## 2019-02-27 DIAGNOSIS — Z5111 Encounter for antineoplastic chemotherapy: Secondary | ICD-10-CM | POA: Diagnosis not present

## 2019-02-27 DIAGNOSIS — C182 Malignant neoplasm of ascending colon: Secondary | ICD-10-CM

## 2019-02-27 DIAGNOSIS — E871 Hypo-osmolality and hyponatremia: Secondary | ICD-10-CM | POA: Diagnosis not present

## 2019-02-27 DIAGNOSIS — K219 Gastro-esophageal reflux disease without esophagitis: Secondary | ICD-10-CM | POA: Diagnosis not present

## 2019-02-27 DIAGNOSIS — R69 Illness, unspecified: Secondary | ICD-10-CM | POA: Diagnosis not present

## 2019-02-27 MED ORDER — SODIUM CHLORIDE 0.9% FLUSH
10.0000 mL | INTRAVENOUS | Status: DC | PRN
  Administered 2019-02-27: 10 mL
  Filled 2019-02-27: qty 10

## 2019-02-27 MED ORDER — HEPARIN SOD (PORK) LOCK FLUSH 100 UNIT/ML IV SOLN
500.0000 [IU] | Freq: Once | INTRAVENOUS | Status: AC | PRN
  Administered 2019-02-27: 500 [IU]

## 2019-03-03 DIAGNOSIS — M25551 Pain in right hip: Secondary | ICD-10-CM | POA: Diagnosis not present

## 2019-03-09 ENCOUNTER — Other Ambulatory Visit: Payer: Self-pay

## 2019-03-09 ENCOUNTER — Telehealth: Payer: Self-pay | Admitting: *Deleted

## 2019-03-09 ENCOUNTER — Ambulatory Visit: Payer: Medicare HMO

## 2019-03-09 ENCOUNTER — Inpatient Hospital Stay (HOSPITAL_BASED_OUTPATIENT_CLINIC_OR_DEPARTMENT_OTHER): Payer: Medicare HMO | Admitting: Oncology

## 2019-03-09 VITALS — BP 105/55 | HR 95 | Resp 18

## 2019-03-09 DIAGNOSIS — R110 Nausea: Secondary | ICD-10-CM

## 2019-03-09 DIAGNOSIS — R197 Diarrhea, unspecified: Secondary | ICD-10-CM | POA: Diagnosis not present

## 2019-03-09 DIAGNOSIS — Z95828 Presence of other vascular implants and grafts: Secondary | ICD-10-CM

## 2019-03-09 DIAGNOSIS — C182 Malignant neoplasm of ascending colon: Secondary | ICD-10-CM

## 2019-03-09 DIAGNOSIS — R531 Weakness: Secondary | ICD-10-CM

## 2019-03-09 DIAGNOSIS — E860 Dehydration: Secondary | ICD-10-CM | POA: Diagnosis not present

## 2019-03-09 DIAGNOSIS — R109 Unspecified abdominal pain: Secondary | ICD-10-CM | POA: Diagnosis not present

## 2019-03-09 LAB — CBC WITH DIFFERENTIAL/PLATELET
Abs Immature Granulocytes: 0.03 10*3/uL (ref 0.00–0.07)
Basophils Absolute: 0.1 10*3/uL (ref 0.0–0.1)
Basophils Relative: 1 %
Eosinophils Absolute: 0 10*3/uL (ref 0.0–0.5)
Eosinophils Relative: 0 %
HCT: 37.9 % (ref 36.0–46.0)
Hemoglobin: 12.5 g/dL (ref 12.0–15.0)
Immature Granulocytes: 0 %
Lymphocytes Relative: 15 %
Lymphs Abs: 1.3 10*3/uL (ref 0.7–4.0)
MCH: 31.3 pg (ref 26.0–34.0)
MCHC: 33 g/dL (ref 30.0–36.0)
MCV: 94.8 fL (ref 80.0–100.0)
Monocytes Absolute: 1.3 10*3/uL — ABNORMAL HIGH (ref 0.1–1.0)
Monocytes Relative: 16 %
Neutro Abs: 5.7 10*3/uL (ref 1.7–7.7)
Neutrophils Relative %: 68 %
Platelets: 181 10*3/uL (ref 150–400)
RBC: 4 MIL/uL (ref 3.87–5.11)
RDW: 17.2 % — ABNORMAL HIGH (ref 11.5–15.5)
WBC: 8.4 10*3/uL (ref 4.0–10.5)
nRBC: 0 % (ref 0.0–0.2)

## 2019-03-09 LAB — COMPREHENSIVE METABOLIC PANEL
ALT: 14 U/L (ref 0–44)
AST: 16 U/L (ref 15–41)
Albumin: 3.6 g/dL (ref 3.5–5.0)
Alkaline Phosphatase: 71 U/L (ref 38–126)
Anion gap: 8 (ref 5–15)
BUN: 14 mg/dL (ref 8–23)
CO2: 19 mmol/L — ABNORMAL LOW (ref 22–32)
Calcium: 8.6 mg/dL — ABNORMAL LOW (ref 8.9–10.3)
Chloride: 103 mmol/L (ref 98–111)
Creatinine, Ser: 0.99 mg/dL (ref 0.44–1.00)
GFR calc Af Amer: >60 mL/min (ref >60)
GFR calc non Af Amer: 55 mL/min — ABNORMAL LOW (ref >60)
Glucose, Bld: 122 mg/dL — ABNORMAL HIGH (ref 70–99)
Potassium: 2.8 mmol/L — ABNORMAL LOW (ref 3.5–5.1)
Sodium: 130 mmol/L — ABNORMAL LOW (ref 135–145)
Total Bilirubin: 0.7 mg/dL (ref 0.3–1.2)
Total Protein: 6.4 g/dL — ABNORMAL LOW (ref 6.5–8.1)

## 2019-03-09 LAB — C DIFFICILE QUICK SCREEN W PCR REFLEX
C Diff antigen: NEGATIVE
C Diff interpretation: NOT DETECTED
C Diff toxin: NEGATIVE

## 2019-03-09 LAB — MAGNESIUM: Magnesium: 1.6 mg/dL — ABNORMAL LOW (ref 1.7–2.4)

## 2019-03-09 MED ORDER — DEXAMETHASONE SODIUM PHOSPHATE 10 MG/ML IJ SOLN
10.0000 mg | Freq: Once | INTRAMUSCULAR | Status: AC
  Administered 2019-03-09: 10 mg via INTRAVENOUS
  Filled 2019-03-09: qty 1

## 2019-03-09 MED ORDER — ONDANSETRON HCL 4 MG/2ML IJ SOLN
8.0000 mg | Freq: Once | INTRAMUSCULAR | Status: AC
  Administered 2019-03-09: 8 mg via INTRAVENOUS

## 2019-03-09 MED ORDER — SODIUM CHLORIDE 0.9 % IV SOLN
Freq: Once | INTRAVENOUS | Status: AC
  Administered 2019-03-09: 15:00:00 via INTRAVENOUS
  Filled 2019-03-09: qty 250

## 2019-03-09 MED ORDER — ONDANSETRON HCL 4 MG/2ML IJ SOLN
INTRAMUSCULAR | Status: AC
  Filled 2019-03-09: qty 4

## 2019-03-09 MED ORDER — POTASSIUM CHLORIDE CRYS ER 20 MEQ PO TBCR: 40.0000 meq | EXTENDED_RELEASE_TABLET | Freq: Two times a day (BID) | ORAL | 0 refills | Status: DC

## 2019-03-09 NOTE — Progress Notes (Addendum)
Virtual Visit via Telephone Note  I connected with Mary Beltran on 03/09/19 at  2:00 PM EST by telephone and verified that I am speaking with the correct person using two identifiers.  Location: Patient: Home Provider: Home   I discussed the limitations, risks, security and privacy concerns of performing an evaluation and management service by telephone and the availability of in person appointments. I also discussed with the patient that there may be a patient responsible charge related to this service. The patient expressed understanding and agreed to proceed.  History of Present Illness:  Oncology History Overview Note  Mary Beltran is a  78 y.o.  female with PMH listed below was seen in consultation at the request of  [name], MD  for evaluation of colon cancer Patient had acute cholecystitis and had laparoscopic cholecystectomy same day by Dr. Peyton Najjar. Patient was found to have iron deficiency anemia with hemoglobin around 9.2, ferritin 7 and iron 24. Baseline hemoglobin 1 year ago was 13.1.  Patient was started on iron supplementation with Ferrex 150 mg iron capsule daily. Reports to have chronic constipation.  Denies any blood in the stool. Patient was seen by gastroenterology and had endoscopy and colonoscopy done on 10/06/2018. There was a partially obstructing mass in the cecum which was biopsied. 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 Gastric mucosa shows reactive foveolar hyperplasia, stromal fibrosis and focal chronic inflammation.  Negative for H. pylori.  Duodenal biopsy shows no abnormalities.  No evidence of celiac disease.  Sigmoid polyps showed tubular adenoma. 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 10 pound weight loss within past few months. Denies any family history colon cancer.  Paternal grandmother passed away due to breast cancer. Per patient's request, I called patient's son [name] and sister  [name], and put both of them on speaker phone so they can hear the conversation and participate in discussion.   # 10/23/2018, she underwent right hemicolectomy Pathology showed invasive moderately differentiated adenocarcinoma involving cecum and adjacent terminal ileum with extension into pericecal adipose tissue and focally to inked serosal surface. Invasive moderately differentiated adenocarcinoma involving proximal ascending colon and ileocecal valve with extension into pericolonic adipose tissue. Tubular adenoma, sessile serrated polyp in the cecum, tubular adenoma sessile serrated polyp in the ascending colon.  Grade 2, tumor invades visceral peritoneum, all margins are negative. Pathological staging pT4a pN1c   #Iron deficiency anemia, status post IV Venofer treatments.     Cancer of right colon ([place])  10/23/2018 Initial Diagnosis   Cancer of right colon ([place])   10/31/2018 Cancer Staging   Staging form: Colon and Rectum, AJCC 8th Edition - Pathologic stage from 10/31/2018: Stage IIIB (pT4a, pN1c, cM0) - Signed by [name], MD on 10/31/2018   12/17/2018 -  Chemotherapy   The patient had leucovorin 692 mg in dextrose 5 % 250 mL infusion, 400 mg/m2 = 692 mg, Intravenous,  Once, 3 of 10 cycles Dose modification: 400 mg/m2 (original dose 400 mg/m2, Cycle 5, Reason: Other (see comments), Comment: over 29UTM per policy) fluorouracil (ADRUCIL) chemo injection 700 mg, 400 mg/m2 = 700 mg, Intravenous,  Once, 5 of 12 cycles Administration: 700 mg (12/17/2018), 700 mg (12/31/2018), 700 mg (01/28/2019), 700 mg (02/11/2019), 700 mg (02/25/2019) leucovorin injection 34 mg, 20 mg/m2 = 34 mg, Intravenous,  Once, 2 of 2 cycles Administration: 34 mg (01/28/2019) fluorouracil (ADRUCIL) 4,150 mg in sodium chloride 0.9 % 67 mL chemo infusion, 2,400 mg/m2 = 4,150 mg, Intravenous, 1 Day/Dose, 5 of  12 cycles Administration: 4,150 mg (12/17/2018), 4,150 mg (12/31/2018), 4,150 mg (01/28/2019), 4,150 mg (02/11/2019), 4,150  mg (02/25/2019)  for chemotherapy treatment.     Observations/Objective: Review of Systems  Constitutional: Positive for malaise/fatigue. Negative for chills, fever and weight loss.  HENT: Negative for congestion, ear pain and tinnitus.   Eyes: Negative.  Negative for blurred vision and double vision.  Respiratory: Negative.  Negative for cough, sputum production and shortness of breath.   Cardiovascular: Negative.  Negative for chest pain, palpitations and leg swelling.  Gastrointestinal: Positive for abdominal pain, diarrhea and nausea. Negative for constipation and vomiting.  Genitourinary: Negative for dysuria, frequency and urgency.  Musculoskeletal: Negative for back pain and falls.  Skin: Negative.  Negative for rash.  Neurological: Negative.  Negative for weakness and headaches.  Endo/Heme/Allergies: Negative.  Does not bruise/bleed easily.  Psychiatric/Behavioral: Negative.  Negative for depression. The patient is not nervous/anxious and does not have insomnia.     Assessment and Plan: [name] is a 78 year old female who presents to symptom management virtually to discuss feeling weak, fatigued after experiencing liquid dark diarrhea for the last 2 days.  She is status post 5 cycles of adjuvant 5-FU.  Has tolerated fairly well to date.  States on Saturday evening she developed diarrhea that has been persistent ever since.  Admits to greater than 8 stools daily.  Watery in consistency with foul odor.  Color is dark.  She has tried Pepto-Bismol without relief.  Tried Imodium today has had 3 tablets without relief.  Complains of nausea without vomiting.  Has a decreased appetite.  She has been unable to consume many liquids or solids since Sunday d/t nausea/abdominl pain.  She has intermittent epigastric pain.  Abdominal pain appears better since yesterday. Does not take narcotics. Has history of hemicolectomy d/t known colon cancer. Last imaging was completed in July 2020.     Diarrhea/dark stool: Check GI panel/Cdiff, cbc (r/o blood).  Abdominal pain: Colon cancer verse infectious process verse bowel obstruction. Will get imaging- last from July 2020.   Nausea: IV Zofran- continue po antiemetics  Dehydration: IV fluids-push po  Hypomagnesemia: Low- Replace  Hypokalemia: Low- replace. RX 40 meq BID for 3 days. 20 meq IV tomorrow.  Plan: Vital signs. stable Stat lab work.  (CBC, CMP, magnesium). Stat stool samples.  C. difficile and GI panel-negative Give 1 L NaCl. Give 8 mg Zofran IV. Give 10 mg dexamethasone IV. CT abdomen STAT.   Spoke at length with patient and son and offered admission to hospital versus emergency room work-up.  Patient was adamant to remain outpatient.  Explained that if symptoms worsened to report directly to the emergency room for further evaluation.  Both son and patient were in agreement of our plan.  She will return first thing in the morning for CT scan and electrolyte replacement.  Follow Up Instructions: Stat abdominal imaging. Scheduled for tomorrow at 11:30am.  RTC tomorrow for repeat lab work, additional IV fluids and electrolyte replacement if needed.    I discussed the assessment and treatment plan with the patient. The patient was provided an opportunity to ask questions and all were answered. The patient agreed with the plan and demonstrated an understanding of the instructions.   The patient was advised to call back or seek an in-person evaluation if the symptoms worsen or if the condition fails to improve as anticipated.  I provided 25 minutes of non-face-to-face time during this encounter.  Jacquelin Hawking, NP

## 2019-03-09 NOTE — Telephone Encounter (Signed)
Yes. Please ask if she has video capabilities/smart phone or camera on her computer. Otherwise, please schedule her for virtual phone/video if possible visit with Sonia Baller for [facility]. Thanks!

## 2019-03-09 NOTE — Telephone Encounter (Signed)
Appointment accepted for 2 PM Telephone with JB

## 2019-03-09 NOTE — Telephone Encounter (Signed)
Patient does not know how to do a virtual visit and has never done one and said she cannot come in to office either, Can you call her for a Tele visit?

## 2019-03-09 NOTE — Telephone Encounter (Signed)
Patient called reporting that she has had diarrhea since yesterday and that she feels dehydrated. She has tried Pepto for her diarrhea, She is asking what to do. Please advise

## 2019-03-09 NOTE — Telephone Encounter (Signed)
We can see her in [facility] if she'd like or if she'd prefer I can see her virtually. Thanks

## 2019-03-10 ENCOUNTER — Inpatient Hospital Stay (HOSPITAL_BASED_OUTPATIENT_CLINIC_OR_DEPARTMENT_OTHER): Payer: Medicare HMO | Admitting: Nurse Practitioner

## 2019-03-10 ENCOUNTER — Emergency Department: Payer: Medicare HMO

## 2019-03-10 ENCOUNTER — Inpatient Hospital Stay: Payer: Medicare HMO | Attending: Oncology

## 2019-03-10 ENCOUNTER — Ambulatory Visit: Admission: RE | Admit: 2019-03-10 | Payer: Medicare HMO | Source: Ambulatory Visit

## 2019-03-10 ENCOUNTER — Encounter: Payer: Self-pay | Admitting: Emergency Medicine

## 2019-03-10 ENCOUNTER — Emergency Department
Admission: EM | Admit: 2019-03-10 | Discharge: 2019-03-10 | Disposition: A | Discharge status: Home / Self Care | Payer: Medicare HMO | Source: Home / Self Care | Attending: Emergency Medicine | Admitting: Emergency Medicine

## 2019-03-10 VITALS — BP 114/43 | HR 94 | Temp 98.3°F | Resp 20

## 2019-03-10 DIAGNOSIS — R109 Unspecified abdominal pain: Secondary | ICD-10-CM

## 2019-03-10 DIAGNOSIS — R1030 Lower abdominal pain, unspecified: Secondary | ICD-10-CM

## 2019-03-10 DIAGNOSIS — Z85038 Personal history of other malignant neoplasm of large intestine: Secondary | ICD-10-CM | POA: Insufficient documentation

## 2019-03-10 DIAGNOSIS — R1110 Vomiting, unspecified: Secondary | ICD-10-CM

## 2019-03-10 DIAGNOSIS — R112 Nausea with vomiting, unspecified: Secondary | ICD-10-CM | POA: Insufficient documentation

## 2019-03-10 DIAGNOSIS — F17210 Nicotine dependence, cigarettes, uncomplicated: Secondary | ICD-10-CM | POA: Insufficient documentation

## 2019-03-10 DIAGNOSIS — R197 Diarrhea, unspecified: Secondary | ICD-10-CM

## 2019-03-10 LAB — COMPREHENSIVE METABOLIC PANEL
ALT: 15 U/L (ref 0–44)
AST: 21 U/L (ref 15–41)
Albumin: 3.7 g/dL (ref 3.5–5.0)
Alkaline Phosphatase: 67 U/L (ref 38–126)
Anion gap: 11 (ref 5–15)
BUN: 10 mg/dL (ref 8–23)
CO2: 18 mmol/L — ABNORMAL LOW (ref 22–32)
Calcium: 9.6 mg/dL (ref 8.9–10.3)
Chloride: 105 mmol/L (ref 98–111)
Creatinine, Ser: 0.78 mg/dL (ref 0.44–1.00)
GFR calc Af Amer: >60 mL/min (ref >60)
GFR calc non Af Amer: >60 mL/min (ref >60)
Glucose, Bld: 133 mg/dL — ABNORMAL HIGH (ref 70–99)
Potassium: 3.8 mmol/L (ref 3.5–5.1)
Sodium: 134 mmol/L — ABNORMAL LOW (ref 135–145)
Total Bilirubin: 0.7 mg/dL (ref 0.3–1.2)
Total Protein: 6.8 g/dL (ref 6.5–8.1)

## 2019-03-10 LAB — CBC
HCT: 36.9 % (ref 36.0–46.0)
Hemoglobin: 12.6 g/dL (ref 12.0–15.0)
MCH: 31.5 pg (ref 26.0–34.0)
MCHC: 34.1 g/dL (ref 30.0–36.0)
MCV: 92.3 fL (ref 80.0–100.0)
Platelets: 179 10*3/uL (ref 150–400)
RBC: 4 MIL/uL (ref 3.87–5.11)
RDW: 17.2 % — ABNORMAL HIGH (ref 11.5–15.5)
WBC: 5.6 10*3/uL (ref 4.0–10.5)
nRBC: 0 % (ref 0.0–0.2)

## 2019-03-10 LAB — LIPASE, BLOOD: Lipase: 23 U/L (ref 11–51)

## 2019-03-10 MED ORDER — IOHEXOL 300 MG/ML  SOLN
100.0000 mL | Freq: Once | INTRAMUSCULAR | Status: AC | PRN
  Administered 2019-03-10: 80 mL via INTRAVENOUS

## 2019-03-10 MED ORDER — SODIUM CHLORIDE 0.9 % IV SOLN
Freq: Once | INTRAVENOUS | Status: AC
  Administered 2019-03-10: 13:00:00 via INTRAVENOUS

## 2019-03-10 MED ORDER — HEPARIN SOD (PORK) LOCK FLUSH 100 UNIT/ML IV SOLN
500.0000 [IU] | Freq: Once | INTRAVENOUS | Status: AC
  Administered 2019-03-09: 500 [IU] via INTRAVENOUS

## 2019-03-10 MED ORDER — SODIUM CHLORIDE 0.9% FLUSH
10.0000 mL | INTRAVENOUS | Status: DC | PRN
  Administered 2019-03-09: 10 mL via INTRAVENOUS
  Filled 2019-03-10: qty 10

## 2019-03-10 MED ORDER — HEPARIN SOD (PORK) LOCK FLUSH 100 UNIT/ML IV SOLN
INTRAVENOUS | Status: AC
  Filled 2019-03-10: qty 5

## 2019-03-10 MED ORDER — ONDANSETRON HCL 4 MG/2ML IJ SOLN
4.0000 mg | Freq: Once | INTRAMUSCULAR | Status: AC
  Administered 2019-03-10: 4 mg via INTRAVENOUS
  Filled 2019-03-10: qty 2

## 2019-03-10 MED ORDER — KETOROLAC TROMETHAMINE 30 MG/ML IJ SOLN
30.0000 mg | Freq: Once | INTRAMUSCULAR | Status: AC
  Administered 2019-03-10: 30 mg via INTRAVENOUS
  Filled 2019-03-10: qty 1

## 2019-03-10 MED ORDER — SODIUM CHLORIDE 0.9% FLUSH: 3.0000 mL | Freq: Once | INTRAVENOUS | Status: DC

## 2019-03-10 NOTE — ED Provider Notes (Addendum)
[facility] [address] Emergency Department Provider Note       Time seen: ----------------------------------------- 12:15 PM on 03/10/2019 -----------------------------------------   I have reviewed the triage vital signs and the nursing notes.  HISTORY   Chief Complaint Abdominal Pain    HPI Mary Beltran is a 78 y.o. female with a history of iron deficiency anemia, skin cancer, GERD who presents to the ED for abdominal pain with diarrhea and vomiting.  Abdominal pain and diarrhea have been going on for 2 to 3 days, vomiting started last night.  She was sent here from the cancer center for further evaluation.  Pain is 8 out of 10 in the abdomen.  Past Medical History:  Diagnosis Date  . Complication of anesthesia    NOT WITH GB  . GERD (gastroesophageal reflux disease)   . IDA (iron deficiency anemia) 10/13/2018  . PONV (postoperative nausea and vomiting)     Patient Active Problem List   Diagnosis Date Noted  . Nausea without vomiting 01/28/2019  . Goals of care, counseling/discussion 12/17/2018  . Family history of cancer 12/17/2018  . Cancer of right colon ([place]) 10/23/2018  . IDA (iron deficiency anemia) 10/13/2018  . Cramps, muscle, general 04/23/2017  . Healthcare maintenance 04/23/2017  . History of nonmelanoma skin cancer 01/01/2017  . Incidental pulmonary nodule 05/10/2015  . 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 ABDOMINAL HYSTERECTOMY     complete  . APPENDECTOMY    . CHOLECYSTECTOMY N/A 08/22/2018   Procedure: LAPAROSCOPIC CHOLECYSTECTOMY;  Surgeon: Herbert Pun, MD;  Location: [facility] ORS;  Service: General;  Laterality: N/A;  . COLONOSCOPY    . LAPAROSCOPIC RIGHT COLECTOMY N/A 10/23/2018   Procedure: LAPAROSCOPIC HAND ASSISTED RIGHT COLECTOMY;  Surgeon: Herbert Pun, MD;  Location: [facility] ORS;  Service: General;  Laterality: N/A;  . PERCUTANEOUS PINNING WRIST FRACTURE Right   .  PORTACATH PLACEMENT Right 12/08/2018   Procedure: INSERTION PORT-A-CATH, CENTRAL VENOUS CATH WITH SUBCUTANEOUS INFUSION PORT;  Surgeon: Herbert Pun, MD;  Location: [facility] ORS;  Service: General;  Laterality: Right;  . WRIST FOREIGN BODY REMOVAL Right    2016    Allergies Pain & fever [acetaminophen], Sulfa antibiotics, Codeine, and Penicillins  Social History Social History   Tobacco Use  . Smoking status: Current Every Day Smoker    Packs/day: 0.25    Years: 60.00    Pack years: 15.00    Types: Cigarettes  . Smokeless tobacco: Never Used  . Tobacco comment: Patient states that this is the only thing she enjoys  Substance Use Topics  . Alcohol use: No  . Drug use: Not Currently   Review of Systems Constitutional: Negative for fever. Cardiovascular: Negative for chest pain. Respiratory: Negative for shortness of breath. Gastrointestinal: Positive for abdominal pain, positive for vomiting and diarrhea Musculoskeletal: Negative for back pain. Skin: Negative for rash. Neurological: Negative for headaches, focal weakness or numbness.  All systems negative/normal/unremarkable except as stated in the HPI  ____________________________________________   PHYSICAL EXAM:  VITAL SIGNS: ED Triage Vitals  Enc Vitals Group     BP 03/10/19 1013 130/84     Pulse Rate 03/10/19 1013 (!) 101     Resp 03/10/19 1013 16     Temp 03/10/19 1013 98.1 F (36.7 C)     Temp Source 03/10/19 1013 Oral     SpO2 03/10/19 1013 98 %     Weight 03/10/19 1006 131 lb 6.3 oz (59.6 kg)  Height --      Head Circumference --      Peak Flow --      Pain Score 03/10/19 1006 8     Pain Loc --      Pain Edu? --      Excl. in [place]? --    Constitutional: Alert and oriented. Well appearing and in no distress. Eyes: Conjunctivae are normal. Normal extraocular movements. Cardiovascular: Normal rate, regular rhythm. No murmurs, rubs, or gallops. Respiratory: Normal respiratory effort without  tachypnea nor retractions. Breath sounds are clear and equal bilaterally. No wheezes/rales/rhonchi. Gastrointestinal: Soft and nontender. Normal bowel sounds Musculoskeletal: Nontender with normal range of motion in extremities. No lower extremity tenderness nor edema. Neurologic:  Normal speech and language. No gross focal neurologic deficits are appreciated.  Skin:  Skin is warm, dry and intact. No rash noted. Psychiatric: Mood and affect are normal. Speech and behavior are normal.   ____________________________________________  ED COURSE:  As part of my medical decision making, I reviewed the following data within the [place] History obtained from family if available, nursing notes, old chart and ekg, as well as notes from prior ED visits. Patient presented for abdominal pain with vomiting and diarrhea, we will assess with labs and imaging as indicated at this time.   Procedures  Mary Beltran was evaluated in Emergency Department on 03/10/2019 for the symptoms described in the history of present illness. She was evaluated in the context of the global COVID-19 pandemic, which necessitated consideration that the patient might be at risk for infection with the SARS-CoV-2 virus that causes COVID-19. Institutional protocols and algorithms that pertain to the evaluation of patients at risk for COVID-19 are in a state of rapid change based on information released by regulatory bodies including the CDC and federal and state organizations. These policies and algorithms were followed during the patient's care in the ED.  ____________________________________________   LABS (pertinent positives/negatives)  Labs Reviewed  COMPREHENSIVE METABOLIC PANEL - Abnormal; Notable for the following components:      Result Value   Sodium 134 (*)    CO2 18 (*)    Glucose, Bld 133 (*)    All other components within normal limits  CBC - Abnormal; Notable for the following components:   RDW  17.2 (*)    All other components within normal limits  LIPASE, BLOOD  URINALYSIS, COMPLETE (UACMP) WITH MICROSCOPIC    RADIOLOGY Images were viewed by me Ct abd/pelvis IMPRESSION: 1. CT findings consistent with an inflammatory or infectious mid distal ileitis. No inflammatory changes involving the colon but there is a moderate amount of fluid throughout the colon typical with diarrhea. 2. Status post partial right hemicolectomy. No findings suspicious for recurrent tumor. 3. No findings for metastatic disease involving liver or lung bases. No mesenteric or retroperitoneal lymphadenopathy.  ____________________________________________   DIFFERENTIAL DIAGNOSIS   Gastritis, gastroenteritis, dehydration, metastasis, obstruction unlikely, infectious colitis  FINAL ASSESSMENT AND PLAN  Abdominal pain, vomiting or diarrhea   Plan: The patient had presented for abdominal pain as well as vomiting and diarrhea. Patient's labs were overall reassuring. Patient's imaging revealed inflammatory processes but no other acute abnormality.  Labs were not indicative of C. difficile colitis yesterday.  No signs for metastasis.  She was given fluids, pain medicine and antiemetics and overall appears improved.  I suspect the ileitis is likely secondary to her chemotherapeutic agents according to the literature.  She has stated she will not be taking chemo anymore.  She states she has antiemetics to take at home.   Laurence Aly, MD    Note: This note was generated in part or whole with voice recognition software. Voice recognition is usually quite accurate but there are transcription errors that can and very often do occur. I apologize for any typographical errors that were not detected and corrected.     Earleen Newport, MD 03/10/19 1218    Earleen Newport, MD 03/10/19 1325

## 2019-03-10 NOTE — Progress Notes (Deleted)
Patient called for prescreening prior to appt. Pt states that she is not coming tomorrow and not proceeding with chemo. I told her she could come and discuss her decision with Dr. Tasia Catchings, but she says she saw Dr. Tasia Catchings today when she was in the clinic and was not coming. She said if anything, the Doctor can give a call.

## 2019-03-10 NOTE — ED Notes (Signed)
Cola provided per patient request.

## 2019-03-10 NOTE — ED Triage Notes (Signed)
C/O abdominal pain, diarrhea and vomiting.  Abdominal pain and diarrhea x 2-3 days, vomiting started last night.  Being sent to ED from [facility] for evaluation.

## 2019-03-10 NOTE — Progress Notes (Signed)
Patient seen in [facility] yesterday for weakness dark liquid diarrhea x 2 days, refractory to pepto and imodium, unable to tolerate liquids or solids, intermittent epigastric pain. She is s/p hemicolectomy for known colon cancer. She received IV fluids. Potassium was low at 2.8 and she was scheduled for IV replacement today. Na 130. C Diff pcr was negative. GI panel pending.  Today, patient complains of 'stool like' emesis that started overnight and intensifying epigastric abdominal pain. Diarrhea persists. Unable to tolerate orals. She has CT scan scheduled for today. Dr. Yu was consulted and advised for patient to go to ER. Patient refused. I met with patient and discussed concerns and her above symptoms and also agreed with recommendation to go to ER. Patient now agreeable. Port accessed in clinic. ER notified of transfer. Labs from today pending: cbc, cmp, lipase.  

## 2019-03-11 ENCOUNTER — Telehealth: Payer: Self-pay | Admitting: *Deleted

## 2019-03-11 ENCOUNTER — Ambulatory Visit: Payer: Medicare HMO | Admitting: Oncology

## 2019-03-11 ENCOUNTER — Inpatient Hospital Stay: Payer: Medicare HMO

## 2019-03-11 ENCOUNTER — Inpatient Hospital Stay: Payer: Medicare HMO | Admitting: Oncology

## 2019-03-11 ENCOUNTER — Inpatient Hospital Stay
Admission: EM | Admit: 2019-03-11 | Discharge: 2019-03-13 | DRG: 374 | Disposition: A | Discharge status: Home / Self Care | Payer: Medicare HMO | Attending: Internal Medicine | Admitting: Internal Medicine

## 2019-03-11 ENCOUNTER — Telehealth: Payer: Self-pay | Admitting: Oncology

## 2019-03-11 ENCOUNTER — Emergency Department: Payer: Medicare HMO

## 2019-03-11 ENCOUNTER — Other Ambulatory Visit: Payer: Self-pay

## 2019-03-11 DIAGNOSIS — R197 Diarrhea, unspecified: Secondary | ICD-10-CM | POA: Diagnosis present

## 2019-03-11 DIAGNOSIS — K5000 Crohn's disease of small intestine without complications: Secondary | ICD-10-CM | POA: Diagnosis not present

## 2019-03-11 DIAGNOSIS — Z9221 Personal history of antineoplastic chemotherapy: Secondary | ICD-10-CM

## 2019-03-11 DIAGNOSIS — R531 Weakness: Secondary | ICD-10-CM | POA: Diagnosis not present

## 2019-03-11 DIAGNOSIS — Z85828 Personal history of other malignant neoplasm of skin: Secondary | ICD-10-CM

## 2019-03-11 DIAGNOSIS — Z882 Allergy status to sulfonamides status: Secondary | ICD-10-CM

## 2019-03-11 DIAGNOSIS — C182 Malignant neoplasm of ascending colon: Secondary | ICD-10-CM | POA: Diagnosis not present

## 2019-03-11 DIAGNOSIS — Z20828 Contact with and (suspected) exposure to other viral communicable diseases: Secondary | ICD-10-CM | POA: Diagnosis present

## 2019-03-11 DIAGNOSIS — Z681 Body mass index (BMI) 19 or less, adult: Secondary | ICD-10-CM

## 2019-03-11 DIAGNOSIS — Z9049 Acquired absence of other specified parts of digestive tract: Secondary | ICD-10-CM

## 2019-03-11 DIAGNOSIS — E43 Unspecified severe protein-calorie malnutrition: Secondary | ICD-10-CM | POA: Diagnosis present

## 2019-03-11 DIAGNOSIS — Z880 Allergy status to penicillin: Secondary | ICD-10-CM

## 2019-03-11 DIAGNOSIS — E876 Hypokalemia: Secondary | ICD-10-CM | POA: Diagnosis not present

## 2019-03-11 DIAGNOSIS — Z885 Allergy status to narcotic agent status: Secondary | ICD-10-CM

## 2019-03-11 DIAGNOSIS — D509 Iron deficiency anemia, unspecified: Secondary | ICD-10-CM | POA: Diagnosis present

## 2019-03-11 DIAGNOSIS — K30 Functional dyspepsia: Secondary | ICD-10-CM | POA: Diagnosis present

## 2019-03-11 DIAGNOSIS — K219 Gastro-esophageal reflux disease without esophagitis: Secondary | ICD-10-CM | POA: Diagnosis present

## 2019-03-11 DIAGNOSIS — Z888 Allergy status to other drugs, medicaments and biological substances status: Secondary | ICD-10-CM

## 2019-03-11 DIAGNOSIS — R1030 Lower abdominal pain, unspecified: Secondary | ICD-10-CM | POA: Diagnosis not present

## 2019-03-11 DIAGNOSIS — R1110 Vomiting, unspecified: Secondary | ICD-10-CM | POA: Diagnosis not present

## 2019-03-11 DIAGNOSIS — I4510 Unspecified right bundle-branch block: Secondary | ICD-10-CM | POA: Diagnosis present

## 2019-03-11 DIAGNOSIS — F17210 Nicotine dependence, cigarettes, uncomplicated: Secondary | ICD-10-CM | POA: Diagnosis present

## 2019-03-11 DIAGNOSIS — T451X5A Adverse effect of antineoplastic and immunosuppressive drugs, initial encounter: Secondary | ICD-10-CM | POA: Diagnosis present

## 2019-03-11 DIAGNOSIS — R112 Nausea with vomiting, unspecified: Secondary | ICD-10-CM

## 2019-03-11 DIAGNOSIS — Z79899 Other long term (current) drug therapy: Secondary | ICD-10-CM

## 2019-03-11 DIAGNOSIS — Z66 Do not resuscitate: Secondary | ICD-10-CM | POA: Diagnosis present

## 2019-03-11 HISTORY — DX: Malignant (primary) neoplasm, unspecified: C80.1

## 2019-03-11 LAB — CBC WITH DIFFERENTIAL/PLATELET
Abs Immature Granulocytes: 0.31 10*3/uL — ABNORMAL HIGH (ref 0.00–0.07)
Basophils Absolute: 0 10*3/uL (ref 0.0–0.1)
Basophils Relative: 0 %
Eosinophils Absolute: 0 10*3/uL (ref 0.0–0.5)
Eosinophils Relative: 0 %
HCT: 33.6 % — ABNORMAL LOW (ref 36.0–46.0)
Hemoglobin: 11.8 g/dL — ABNORMAL LOW (ref 12.0–15.0)
Immature Granulocytes: 3 %
Lymphocytes Relative: 14 %
Lymphs Abs: 1.6 10*3/uL (ref 0.7–4.0)
MCH: 31.1 pg (ref 26.0–34.0)
MCHC: 35.1 g/dL (ref 30.0–36.0)
MCV: 88.4 fL (ref 80.0–100.0)
Monocytes Absolute: 2.1 10*3/uL — ABNORMAL HIGH (ref 0.1–1.0)
Monocytes Relative: 19 %
Neutro Abs: 7.2 10*3/uL (ref 1.7–7.7)
Neutrophils Relative %: 64 %
Platelets: 164 10*3/uL (ref 150–400)
RBC: 3.8 MIL/uL — ABNORMAL LOW (ref 3.87–5.11)
RDW: 17.3 % — ABNORMAL HIGH (ref 11.5–15.5)
Smear Review: NORMAL
WBC: 11.1 10*3/uL — ABNORMAL HIGH (ref 4.0–10.5)
nRBC: 0 % (ref 0.0–0.2)

## 2019-03-11 LAB — COMPREHENSIVE METABOLIC PANEL
ALT: 16 U/L (ref 0–44)
AST: 20 U/L (ref 15–41)
Albumin: 3 g/dL — ABNORMAL LOW (ref 3.5–5.0)
Alkaline Phosphatase: 65 U/L (ref 38–126)
Anion gap: 10 (ref 5–15)
BUN: 15 mg/dL (ref 8–23)
CO2: 17 mmol/L — ABNORMAL LOW (ref 22–32)
Calcium: 9.1 mg/dL (ref 8.9–10.3)
Chloride: 104 mmol/L (ref 98–111)
Creatinine, Ser: 0.71 mg/dL (ref 0.44–1.00)
GFR calc Af Amer: >60 mL/min (ref >60)
GFR calc non Af Amer: >60 mL/min (ref >60)
Glucose, Bld: 105 mg/dL — ABNORMAL HIGH (ref 70–99)
Potassium: 3.1 mmol/L — ABNORMAL LOW (ref 3.5–5.1)
Sodium: 131 mmol/L — ABNORMAL LOW (ref 135–145)
Total Bilirubin: 0.7 mg/dL (ref 0.3–1.2)
Total Protein: 5.9 g/dL — ABNORMAL LOW (ref 6.5–8.1)

## 2019-03-11 LAB — POC SARS CORONAVIRUS 2 AG: SARS Coronavirus 2 Ag: NEGATIVE

## 2019-03-11 LAB — MAGNESIUM: Magnesium: 1.6 mg/dL — ABNORMAL LOW (ref 1.7–2.4)

## 2019-03-11 LAB — TROPONIN I (HIGH SENSITIVITY): Troponin I (High Sensitivity): 17 ng/L (ref <18)

## 2019-03-11 LAB — PROTIME-INR
INR: 1.5 — ABNORMAL HIGH (ref 0.8–1.2)
Prothrombin Time: 18.3 seconds — ABNORMAL HIGH (ref 11.4–15.2)

## 2019-03-11 LAB — LACTIC ACID, PLASMA: Lactic Acid, Venous: 1.8 mmol/L (ref 0.5–1.9)

## 2019-03-11 MED ORDER — ENOXAPARIN SODIUM 40 MG/0.4ML ~~LOC~~ SOLN
40.0000 mg | SUBCUTANEOUS | Status: DC
  Administered 2019-03-12: 22:00:00 40 mg via SUBCUTANEOUS
  Filled 2019-03-11 (×2): qty 0.4

## 2019-03-11 MED ORDER — CIPROFLOXACIN IN D5W 200 MG/100ML IV SOLN
200.0000 mg | Freq: Two times a day (BID) | INTRAVENOUS | Status: DC
  Administered 2019-03-11: 20:00:00 200 mg via INTRAVENOUS
  Filled 2019-03-11 (×2): qty 100

## 2019-03-11 MED ORDER — MAGNESIUM CITRATE PO SOLN
1.0000 | Freq: Once | ORAL | Status: DC | PRN
  Filled 2019-03-11: qty 296

## 2019-03-11 MED ORDER — SODIUM CHLORIDE 0.9 % IV BOLUS
1000.0000 mL | Freq: Once | INTRAVENOUS | Status: AC
  Administered 2019-03-11: 14:00:00 1000 mL via INTRAVENOUS

## 2019-03-11 MED ORDER — POTASSIUM CHLORIDE IN NACL 20-0.9 MEQ/L-% IV SOLN
Freq: Once | INTRAVENOUS | Status: AC
  Administered 2019-03-11: 17:00:00 via INTRAVENOUS
  Filled 2019-03-11: qty 1000

## 2019-03-11 MED ORDER — SODIUM CHLORIDE 0.9 % IV SOLN
INTRAVENOUS | Status: DC
  Administered 2019-03-12: 06:00:00 via INTRAVENOUS

## 2019-03-11 MED ORDER — ONDANSETRON HCL 4 MG/2ML IJ SOLN: 4.0000 mg | Freq: Four times a day (QID) | INTRAMUSCULAR | Status: DC | PRN

## 2019-03-11 MED ORDER — METRONIDAZOLE IN NACL 5-0.79 MG/ML-% IV SOLN
500.0000 mg | Freq: Three times a day (TID) | INTRAVENOUS | Status: DC
  Administered 2019-03-11 – 2019-03-12 (×2): 500 mg via INTRAVENOUS
  Filled 2019-03-11 (×5): qty 100

## 2019-03-11 MED ORDER — SODIUM CHLORIDE 0.9 % IV SOLN: Freq: Once | INTRAVENOUS | Status: DC

## 2019-03-11 MED ORDER — ONDANSETRON HCL 4 MG PO TABS
4.0000 mg | ORAL_TABLET | Freq: Four times a day (QID) | ORAL | Status: DC | PRN
  Filled 2019-03-11: qty 1

## 2019-03-11 MED ORDER — ONDANSETRON HCL 4 MG/2ML IJ SOLN
4.0000 mg | Freq: Once | INTRAMUSCULAR | Status: AC
  Administered 2019-03-11: 14:00:00 4 mg via INTRAVENOUS
  Filled 2019-03-11: qty 2

## 2019-03-11 MED ORDER — TRAZODONE HCL 50 MG PO TABS
25.0000 mg | ORAL_TABLET | Freq: Every evening | ORAL | Status: DC | PRN
  Filled 2019-03-11: qty 0.5

## 2019-03-11 MED ORDER — POLYETHYLENE GLYCOL 3350 17 G PO PACK
17.0000 g | PACK | Freq: Every day | ORAL | Status: DC | PRN
  Filled 2019-03-11: qty 1

## 2019-03-11 MED ORDER — BISACODYL 5 MG PO TBEC
5.0000 mg | DELAYED_RELEASE_TABLET | Freq: Every day | ORAL | Status: DC | PRN
  Filled 2019-03-11: qty 1

## 2019-03-11 MED ORDER — PANTOPRAZOLE SODIUM 40 MG IV SOLR
40.0000 mg | Freq: Two times a day (BID) | INTRAVENOUS | Status: DC
  Administered 2019-03-12 – 2019-03-13 (×3): 40 mg via INTRAVENOUS
  Filled 2019-03-11 (×5): qty 40

## 2019-03-11 MED ORDER — FENTANYL CITRATE (PF) 100 MCG/2ML IJ SOLN
50.0000 ug | INTRAMUSCULAR | Status: DC | PRN
  Administered 2019-03-11: 50 ug via INTRAVENOUS
  Filled 2019-03-11: qty 2

## 2019-03-11 MED ORDER — MAGNESIUM SULFATE 2 GM/50ML IV SOLN
2.0000 g | Freq: Once | INTRAVENOUS | Status: AC
  Administered 2019-03-11: 19:00:00 2 g via INTRAVENOUS
  Filled 2019-03-11: qty 50

## 2019-03-11 MED ORDER — CIPROFLOXACIN IN D5W 400 MG/200ML IV SOLN
400.0000 mg | Freq: Two times a day (BID) | INTRAVENOUS | Status: DC
  Administered 2019-03-12: 06:00:00 400 mg via INTRAVENOUS
  Filled 2019-03-11 (×2): qty 200

## 2019-03-11 NOTE — ED Triage Notes (Signed)
Pt was seen here yesterday for N/V/D since Saturday with generalized weakness, states she was feeling better after getting fluids but has not been able to keep anything down. Pt had last chemo treatment was 2 weeks ago for colon CA, states she is due to have next one tomorrow but states she does not want anymore treatments.

## 2019-03-11 NOTE — H&P (Addendum)
History and Physical    Mary Beltran ULA:[id number] DOB: [birth year] DOA: 03/11/2019  PCP: [name], MD   Patient coming from: Home  I have personally briefly reviewed patient's old medical records in [place]  Chief Complaint: intractable vomiting and diarreha  HPI: Mary Beltran is a 78 y.o. female with medical history significant of colon cancer s/p hemicolectomy and started on chemotherapy.  Presented to the ED today with intractable vomiting and diarrhea, inability to keep down any PO intake.  Son, at bedside, assists with providing history, but patient is reliable historian.  They report onset of vomiting and diarrhea on Saturday (4 days ago).  Her last chemo treatment was 2 weeks ago.  She reports that after previous treatments, she was very fatigued and had red, painful hands, but so far did not have GI symptoms.  Prior to onset of current symptoms, patient had decided not to continue with chemotherapy.  In the past 24 hours, has had "a lot" of diarrhea episodes and vomited about 3 times.  She reports very little nausea before vomiting, it just comes out of nowhere.  Associated belching / indigestion and sharp abdominal pain that is poorly localized across mid/lower abdomen, and subjective fever with shaking chills yesterday evening.  She took Immodium for diarrhea but it did not help at all.  Patient and her son contacted her oncologist who recommended coming to ED yesterday.  She was in for evaluation, CT scan abdomen/pelvis showed distal ileitis but no other acute findings.  She was offered admission vs antiemetics at home after IV hydration.  She was feeling better after fluids, so elected to return home.  However, symptoms returned and she continues to not tolerate PO intake, so returned to ED today.    ED Course: tachycardic intermittently, borderline BP's 98/33, 105/49, afebrile.  Labs notable for Na 131, K 3.1, CO2 17, Mg 1.6, WBC 11.1k, troponin negative.  IV fluids  were started with potassium repletion.    She is admitted for observation, electrolyte repletion and IV hydration.  Review of Systems: As per HPI otherwise 10 point review of systems negative.    Past Medical History:  Diagnosis Date   Cancer ([place])    Complication of anesthesia    NOT WITH GB   GERD (gastroesophageal reflux disease)    IDA (iron deficiency anemia) 10/13/2018   PONV (postoperative nausea and vomiting)     Past Surgical History:  Procedure Laterality Date   ABDOMINAL HYSTERECTOMY     complete   APPENDECTOMY     CHOLECYSTECTOMY N/A 08/22/2018   Procedure: LAPAROSCOPIC CHOLECYSTECTOMY;  Surgeon: Herbert Pun, MD;  Location: [facility] ORS;  Service: General;  Laterality: N/A;   COLONOSCOPY     LAPAROSCOPIC RIGHT COLECTOMY N/A 10/23/2018   Procedure: LAPAROSCOPIC HAND ASSISTED RIGHT COLECTOMY;  Surgeon: Herbert Pun, MD;  Location: [facility] ORS;  Service: General;  Laterality: N/A;   PERCUTANEOUS PINNING WRIST FRACTURE Right    PORTACATH PLACEMENT Right 12/08/2018   Procedure: INSERTION PORT-A-CATH, CENTRAL VENOUS CATH WITH SUBCUTANEOUS INFUSION PORT;  Surgeon: Herbert Pun, MD;  Location: [facility] ORS;  Service: General;  Laterality: Right;   WRIST FOREIGN BODY REMOVAL Right    2016     reports that she has been smoking cigarettes. She has a 15.00 pack-year smoking history. She has never used smokeless tobacco. She reports previous drug use. She reports that she does not drink alcohol.  Allergies  Allergen Reactions   Pain & Fever [Acetaminophen]  Patient states all pain medications make her "vomit"   Sulfa Antibiotics Nausea And Vomiting   Codeine Nausea And Vomiting   Penicillins Other (See Comments)    Did it involve swelling of the face/tongue/throat, SOB, or low BP? Yes Did it involve sudden or severe rash/hives, skin peeling, or any reaction on the inside of your mouth or nose? Yes Did you need to seek medical attention at a  hospital or doctor's office? Yes When did it last happen?in her 63s If all above answers are NO, may proceed with cephalosporin use.     Family History  Problem Relation Age of Onset   COPD Mother    Suicidality Father    Breast cancer Paternal Grandmother    Failure to thrive Sister      Prior to Admission medications   Medication Sig Start Date End Date Taking? Authorizing Provider  lidocaine-prilocaine (EMLA) cream Apply to affected area once 11/07/18   [name], MD  ondansetron (ZOFRAN) 8 MG tablet Take 1 tablet (8 mg total) by mouth 2 (two) times daily as needed (Nausea or vomiting). 11/07/18   [name], MD  potassium chloride SA (KLOR-CON) 20 MEQ tablet Take 2 tablets (40 mEq total) by mouth 2 (two) times daily. 03/09/19   Jacquelin Hawking, NP  prochlorperazine (COMPAZINE) 10 MG tablet Take 1 tablet (10 mg total) by mouth every 6 (six) hours as needed (Nausea or vomiting). 11/07/18   [name], MD    Physical Exam: Vitals:   03/11/19 1530 03/11/19 1630 03/11/19 1700 03/11/19 1730  BP: (!) 110/51 (!) 115/56 (!) 102/54 (!) 105/49  Pulse: 96 97 76 (!) 125  Resp: (!) 21 (!) 25 (!) 24 (!) 21  TempSrc:      SpO2: 96% 97% 96% 96%  Weight:      Height:         Vitals:   03/11/19 1530 03/11/19 1630 03/11/19 1700 03/11/19 1730  BP: (!) 110/51 (!) 115/56 (!) 102/54 (!) 105/49  Pulse: 96 97 76 (!) 125  Resp: (!) 21 (!) 25 (!) 24 (!) 21  TempSrc:      SpO2: 96% 97% 96% 96%  Weight:      Height:       Constitutional: NAD, calm, appears uncomfortable Eyes: lids and conjunctivae normal, EOMI ENMT: Mucous membranes are dry.  Normal dentition.  Neck: normal, supple, no masses, no thyromegaly Respiratory: clear to auscultation bilaterally but decreased at bases, no wheezing, no crackles. Normal respiratory effort. No accessory muscle use.  Cardiovascular: Regular rate and rhythm, no murmurs / rubs / gallops. No extremity edema.  Abdomen: diffusely tender on palpation  but no guarding or rebound, no distenstion, bowel sounds positive.  Musculoskeletal: no clubbing / cyanosis. No joint deformity upper and lower extremities.  Skin: dry, intact, normal temperature Neurologic: CN 2-12 grossly intact. Normal speech. Psychiatric: Normal judgment and insight. Alert and oriented x 3. Normal mood.     Labs on Admission: I have personally reviewed following labs and imaging studies  CBC: Recent Labs  Lab 03/09/19 1500 03/10/19 1015 03/11/19 1415  WBC 8.4 5.6 11.1*  NEUTROABS 5.7  --  7.2  HGB 12.5 12.6 11.8*  HCT 37.9 36.9 33.6*  MCV 94.8 92.3 88.4  PLT 181 179 297   Basic Metabolic Panel: Recent Labs  Lab 03/09/19 1500 03/10/19 1015 03/11/19 1415  NA 130* 134* 131*  K 2.8* 3.8 3.1*  CL 103 105 104  CO2 19* 18* 17*  GLUCOSE  122* 133* 105*  BUN 14 10 15   CREATININE 0.99 0.78 0.71  CALCIUM 8.6* 9.6 9.1  MG 1.6*  --  1.6*   GFR: Estimated Creatinine Clearance: 51.4 mL/min (by C-G formula based on SCr of 0.71 mg/dL). Liver Function Tests: Recent Labs  Lab 03/09/19 1500 03/10/19 1015 03/11/19 1415  AST 16 21 20   ALT 14 15 16   ALKPHOS 71 67 65  BILITOT 0.7 0.7 0.7  PROT 6.4* 6.8 5.9*  ALBUMIN 3.6 3.7 3.0*   Recent Labs  Lab 03/10/19 1015  LIPASE 23   No results for input(s): AMMONIA in the last 168 hours. Coagulation Profile: Recent Labs  Lab 03/11/19 1415  INR 1.5*   Cardiac Enzymes: No results for input(s): CKTOTAL, CKMB, CKMBINDEX, TROPONINI in the last 168 hours. BNP (last 3 results) No results for input(s): PROBNP in the last 8760 hours. HbA1C: No results for input(s): HGBA1C in the last 72 hours. CBG: No results for input(s): GLUCAP in the last 168 hours. Lipid Profile: No results for input(s): CHOL, HDL, LDLCALC, TRIG, CHOLHDL, LDLDIRECT in the last 72 hours. Thyroid Function Tests: No results for input(s): TSH, T4TOTAL, FREET4, T3FREE, THYROIDAB in the last 72 hours. Anemia Panel: No results for input(s):  VITAMINB12, FOLATE, FERRITIN, TIBC, IRON, RETICCTPCT in the last 72 hours. Urine analysis:    Component Value Date/Time   COLORURINE STRAW (A) 04/23/2015 1711   APPEARANCEUR CLEAR (A) 04/23/2015 1711   LABSPEC 1.005 04/23/2015 1711   PHURINE 7.0 04/23/2015 1711   GLUCOSEU NEGATIVE 04/23/2015 1711   HGBUR 1+ (A) 04/23/2015 1711   BILIRUBINUR NEGATIVE 04/23/2015 1711   KETONESUR NEGATIVE 04/23/2015 1711   PROTEINUR NEGATIVE 04/23/2015 1711   NITRITE NEGATIVE 04/23/2015 1711   LEUKOCYTESUR NEGATIVE 04/23/2015 1711    Radiological Exams on Admission: Ct Abdomen Pelvis W Contrast  Result Date: 03/10/2019 CLINICAL DATA:  Abdominal pain, diarrhea and vomiting for 2-3 days. History of colon cancer and status post partial right hemicolectomy. EXAM: CT ABDOMEN AND PELVIS WITH CONTRAST TECHNIQUE: Multidetector CT imaging of the abdomen and pelvis was performed using the standard protocol following bolus administration of intravenous contrast. CONTRAST:  49m OMNIPAQUE IOHEXOL 300 MG/ML  SOLN COMPARISON:  CT scan and 10/09/2018 FINDINGS: Lower chest: The lung bases are clear of acute process. No pleural effusion or pulmonary lesions. The heart is normal in size. No pericardial effusion. The distal esophagus and aorta are unremarkable. Hepatobiliary: No focal hepatic lesions to suggest hepatic metastatic disease. The portal and hepatic veins are patent. The gallbladder surgically absent. No common bile duct dilatation. Pancreas: No mass, inflammation or ductal dilatation. Spleen: Normal size.  No focal lesions. Adrenals/Urinary Tract: Stable nodule involving the medial limb of the left adrenal gland, unchanged since 2017 and consistent with a benign adenoma. No renal lesions or hydronephrosis.  The bladder appears normal. Stomach/Bowel: The stomach and duodenum are unremarkable. The proximal jejunal loops of small bowel appear normal. The mid distal ileal loops of small bowel are abnormal. There is wall  thickening and submucosal edema along with serosal and mucosal enhancement consistent with an inflammatory or infectious ileitis. This extends all the way to the ileal colonic anastomosis. No inflammatory changes involving the colon are identified but there is a moderate amount of fluid throughout the colon typical with diarrhea. Vascular/Lymphatic: Advanced atherosclerotic calcifications involving the abdominal aorta and iliac arteries and branch vessels. No focal aneurysm or dissection. The major venous structures are patent. No mesenteric or retroperitoneal mass or adenopathy. Reproductive: Surgically absent.  Other: No pelvic mass or pelvic lymphadenopathy. No free pelvic fluid collections. No inguinal mass or adenopathy. Musculoskeletal: No significant bony findings. IMPRESSION: 1. CT findings consistent with an inflammatory or infectious mid distal ileitis. No inflammatory changes involving the colon but there is a moderate amount of fluid throughout the colon typical with diarrhea. 2. Status post partial right hemicolectomy. No findings suspicious for recurrent tumor. 3. No findings for metastatic disease involving liver or lung bases. No mesenteric or retroperitoneal lymphadenopathy. Electronically Signed   By: Marijo Sanes M.D.   On: 03/10/2019 11:27    EKG: none  Assessment/Plan Principal Problem:   Intractable nausea and vomiting Active Problems:   Hypokalemia   Hypomagnesemia   Diarrhea   Ileitis, terminal (HCC)   Cancer of right colon (HCC)  Intractable Nausea and Vomiting Diarrhea Distal Ileitis, on CT abdomen/pelvis 12/1 Suspect chemotherapy driving all of this, however, could be infectious as well.  ED workup on 12/1 - C diff negative.  CT abdomen/pelvis showed inflammatory or infectious mid/distal ileitis, no suspicious findings for tumor recurrence, no metastatic lesions in liver or lung bases or adenopathy.  Per patient's son, was told patient's chemo medication is associated  with ileitis(?). - follow up GI pathogen panel - Zofran PRN  - IV hydration - will start on Cipro/Flagyl in case of infectious ileitis - consider GI consult - Protonix 40 mg IV BID for now  Hypokalemia - repleted in ED Hypomagnesemia - repleting IV - monitor electrolytes - maintain K>4, Mg>2  Colon Cancer, s/p hemicolectomy on chemotherapy - patient declining further chemo - follow with Dr. Tasia Catchings    DVT prophylaxis: Lovenox Code Status: DNR  Family Communication:son at bedside Disposition Plan: expect d/c home in 24-48 hours pending clinical improvement   Consults called: none Admission status: obs   Ezekiel Slocumb, DO [facility] Pager [phone]  If 7PM-7AM, please contact night-coverage [email or web address] Password [facility]  03/11/2019, 6:05 PM

## 2019-03-11 NOTE — ED Notes (Signed)
Pt assisted to bedside commode. Pt able to ambulate indpendently. Pt respirations slightly labored with rate of 25 with ambulation. Pt assisted back to bed. Pt oxygen 95% on room air.

## 2019-03-11 NOTE — ED Provider Notes (Signed)
[facility] Emergency Department Provider Note  ____________________________________________   First MD Initiated Contact with Patient 03/11/19 1502     (approximate)  I have reviewed the triage vital signs and the nursing notes.  History  Chief Complaint Weakness and Emesis    HPI Jolonda Uemura is a 78 y.o. female with history of anemia, colon cancer s/p hemicolectomy, currently on chemotherapy, who presents to the emergency department for generalized weakness, nausea, vomiting, diarrhea. Symptoms started 3-4 days ago and have been constant. Seen initially in the clnic on 11/30, had negative C diff testing, GI pathogen panel sent. Seen in the ED yesterday, CT demonstrated distal ileitis, and was offered admission at that time, but was feeling improved after IV fluids/symptom control and wanted to continue to trial outpatient management. Returns today for worsening symptoms. Not tolerating PO. Multiple episodes of vomiting and watery diarrhea daily. Discussed with her oncologist, Dr. Tasia Catchings who advised admission for further symptom management given unable to control symptoms on outpatient basis.    Past Medical Hx Past Medical History:  Diagnosis Date  . Cancer ([place])   . Complication of anesthesia    NOT WITH GB  . GERD (gastroesophageal reflux disease)   . IDA (iron deficiency anemia) 10/13/2018  . PONV (postoperative nausea and vomiting)     Problem List Patient Active Problem List   Diagnosis Date Noted  . Nausea without vomiting 01/28/2019  . Goals of care, counseling/discussion 12/17/2018  . Family history of cancer 12/17/2018  . Cancer of right colon ([place]) 10/23/2018  . IDA (iron deficiency anemia) 10/13/2018  . Cramps, muscle, general 04/23/2017  . Healthcare maintenance 04/23/2017  . History of nonmelanoma skin cancer 01/01/2017  . Incidental pulmonary nodule 05/10/2015  . 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 ABDOMINAL HYSTERECTOMY     complete  . APPENDECTOMY    . CHOLECYSTECTOMY N/A 08/22/2018   Procedure: LAPAROSCOPIC CHOLECYSTECTOMY;  Surgeon: Herbert Pun, MD;  Location: [facility] ORS;  Service: General;  Laterality: N/A;  . COLONOSCOPY    . LAPAROSCOPIC RIGHT COLECTOMY N/A 10/23/2018   Procedure: LAPAROSCOPIC HAND ASSISTED RIGHT COLECTOMY;  Surgeon: Herbert Pun, MD;  Location: [facility] ORS;  Service: General;  Laterality: N/A;  . PERCUTANEOUS PINNING WRIST FRACTURE Right   . PORTACATH PLACEMENT Right 12/08/2018   Procedure: INSERTION PORT-A-CATH, CENTRAL VENOUS CATH WITH SUBCUTANEOUS INFUSION PORT;  Surgeon: Herbert Pun, MD;  Location: [facility] ORS;  Service: General;  Laterality: Right;  . WRIST FOREIGN BODY REMOVAL Right    2016    Medications Prior to Admission medications   Medication Sig Start Date End Date Taking? Authorizing Provider  lidocaine-prilocaine (EMLA) cream Apply to affected area once 11/07/18   [name], MD  ondansetron (ZOFRAN) 8 MG tablet Take 1 tablet (8 mg total) by mouth 2 (two) times daily as needed (Nausea or vomiting). 11/07/18   [name], MD  potassium chloride SA (KLOR-CON) 20 MEQ tablet Take 2 tablets (40 mEq total) by mouth 2 (two) times daily. 03/09/19   Jacquelin Hawking, NP  prochlorperazine (COMPAZINE) 10 MG tablet Take 1 tablet (10 mg total) by mouth every 6 (six) hours as needed (Nausea or vomiting). 11/07/18   [name], MD    Allergies Pain & fever [acetaminophen], Sulfa antibiotics, Codeine, and Penicillins  Family Hx Family History  Problem Relation Age of Onset  . COPD Mother   . Suicidality Father   . Breast cancer  Paternal Grandmother   . Failure to thrive Sister     Social Hx Social History   Tobacco Use  . Smoking status: Current Every Day Smoker    Packs/day: 0.25    Years: 60.00    Pack years: 15.00    Types: Cigarettes  . Smokeless tobacco: Never Used  . Tobacco comment: Patient  states that this is the only thing she enjoys  Substance Use Topics  . Alcohol use: No  . Drug use: Not Currently     Review of Systems  Constitutional: Negative for fever, chills. Eyes: Negative for visual changes. ENT: Negative for sore throat. Cardiovascular: Negative for chest pain. Respiratory: Negative for shortness of breath. Gastrointestinal: + for nausea, vomiting, diarrhea.  Genitourinary: Negative for dysuria. Musculoskeletal: Negative for leg swelling. Skin: Negative for rash. Neurological: Negative for for headaches.   Physical Exam  Vital Signs: ED Triage Vitals  Enc Vitals Group     BP 03/11/19 1356 (!) 98/33     Pulse Rate 03/11/19 1356 68     Resp 03/11/19 1356 (!) 22     Temp --      Temp Source 03/11/19 1356 Oral     SpO2 03/11/19 1356 97 %     Weight 03/11/19 1358 120 lb (54.4 kg)     Height 03/11/19 1358 5' 7"  (1.702 m)     Head Circumference --      Peak Flow --      Pain Score 03/11/19 1357 10     Pain Loc --      Pain Edu? --      Excl. in [place]? --     Constitutional: Alert and oriented. Appears weak/fatigued.  Head: Normocephalic. Atraumatic. Eyes: Conjunctivae clear. Sclera anicteric. Nose: No congestion. No rhinorrhea. Mouth/Throat: Wearing mask. MM dry.  Neck: No stridor.   Cardiovascular: Normal rate, heavy PVC burden. Extremities well perfused. Respiratory: Normal respiratory effort.  Lungs CTAB. Gastrointestinal: Soft. Mild tenderness to lower abdomen, no rebound or guarding. Non-distended.  Musculoskeletal: No lower extremity edema. No deformities. Neurologic:  Normal speech and language. No gross focal neurologic deficits are appreciated.  Skin: Skin is warm, dry and intact. No rash noted. Psychiatric: Mood and affect are appropriate for situation.  EKG  Personally reviewed.   Rate: 80s Rhythm: sinus, with frequent PVC Axis: normal Intervals: RBBB Sinus rhythm, with heavy PVC burden, often in trigeminy No STEMI     Radiology  CT A/P done 03/10/19:  IMPRESSION: 1. CT findings consistent with an inflammatory or infectious mid distal ileitis. No inflammatory changes involving the colon but there is a moderate amount of fluid throughout the colon typical with diarrhea. 2. Status post partial right hemicolectomy. No findings suspicious for recurrent tumor. 3. No findings for metastatic disease involving liver or lung bases. No mesenteric or retroperitoneal lymphadenopathy.   Procedures  Procedure(s) performed (including critical care):  Procedures   Initial Impression / Assessment and Plan / ED Course  78 y.o. female with history of anemia, colon cancer s/p hemicolectomy + currently on chemotherapy who presents to the emergency department for generalized weakness, and intractable nausea, vomiting, diarrhea.  Referred for admission by her oncologist due to failed outpatient management of her symptoms.   Discussed with patient's oncologist, Dr. Tasia Catchings, less likely to be side effect from chemotherapy given it has been 2 weeks from her last treatment, and she tolerated prior treatments well.  Consider toxic buildup of chemotherapy agents vs viral process. CT scan done yesterday, as  above.  We will send COVID testing.  GI panel sent from her PCP visit.  Negative for C. Difficile thus far.  We will give IV fluids, replete potassium, symptom control, and admit. Patient and family agreeable with plan.    Final Clinical Impression(s) / ED Diagnosis  Final diagnoses:  Nausea vomiting and diarrhea       Note:  This document was prepared using Dragon voice recognition software and may include unintentional dictation errors.   Lilia Pro., MD 03/11/19 [phone]

## 2019-03-11 NOTE — Telephone Encounter (Signed)
Patient was sent to emergency room yesterday due to refractory nausea, vomiting, diarrhea and profound weakness. CT abdomen pelvis was done in the ER which showed a distal ileitis Per patient ER offered options of admission or have her go home and take antiemetics.  Patient preferred going home so that she can sleep better. Patient son called today and expressed his feelings of being upset and unimpressed by cancer center service. Patient continues to have nausea, vomiting, diarrhea and not able to keep anything down.  Feels very very weak.  He also reports that they had to wait very long time in the emergency room to be seen. I had a lengthy discussion-more than 45 minutes with patient's son over the phone. CT images were independently reviewed and discussed with patient. It is possible that her symptoms were combination from acute viral infection versus accumulated chemo toxicities. Patient was able to tolerate previous cycles with very mild difficulties.  No profound diarrhea or nausea in the past. She had last chemotherapy 2 weeks ago.  It is an unusual pattern for her to suddenly experience severe nausea/vomiting/diarrhea symptoms when she is close to next cycle of chemo.  Usually patient becomes more symptomatic right after or during the middle of the cycle.  GI panel is pending. Discussed with patient's son and recommend patient to go to emergency room for evaluation and management.  I recommend patient to be admitted for supportive care.  She has clearly failed outpatient management. I have called on-call hospitalist Dr. Blaine Hamper for possible direct admission.  Patient does not have any vital sign today and per hospitalist, there is no bed available on the floor.  I explained to patient's son that it would be safer for patients to go to emergency room. I also discussed with patient that there might be a waiting time today at ER.  Patient will be triaged depending her overall conditions.  Son  appreciates explanation and voices understanding and agrees with the plan.  He will take his mother to the [facility] ER. I have called ER triage nurse Caryl Pina and updated her.  I left my phone for emergency room doctor to give me a call after patient is being evaluated.

## 2019-03-11 NOTE — Telephone Encounter (Signed)
Dr. Tasia Catchings has spoken to patient's son and they have decided to proceed to the ER.

## 2019-03-11 NOTE — ED Notes (Signed)
Admitting MD at bedside at this time.

## 2019-03-11 NOTE — Telephone Encounter (Signed)
Patient son Chriss Czar called VERY upset at the care his mother is receiving from [place]. He states that they were told before she started chemotherapy that she would have control of side effects that chemotherapy causes. She was sent to ER yesterday and was told that the chemotherapy has caused inflammation of her small intestine. They offered for her to be kept, but he decided she would rest better at home. He stated that Dr Tasia Catchings told him yesterday that she would follow up with her and that she has not. He reports that patient continues to have diarrhea and is unable to eat and is afraid that she will become dehydrated again. He feels like we do not care about her and wants answers and for something to be done for patient.

## 2019-03-11 NOTE — ED Notes (Signed)
First Nurse Note:  This RN received phone call from Dr. Tasia Catchings w/ [facility]; states she is sending patient here for admission, reports attempting to direct admit patient but due to the hospital being full, she was unable to do so.  Pt returns with ongoing N/V/D, unable to keep anything down.    Dr Tasia Catchings specifically asks for ED MD to consult with her; [phone].

## 2019-03-11 NOTE — Telephone Encounter (Signed)
Called son to let him know that Dr Tasia Catchings will call him today. I also asked him if he was aware she had an appointment today and he stated that they told our office that she was not going to take any more treatments and that the appointment was supposed to have been cancelled.

## 2019-03-11 NOTE — ED Notes (Signed)
EDP at bedside at this time.

## 2019-03-12 DIAGNOSIS — R197 Diarrhea, unspecified: Secondary | ICD-10-CM | POA: Diagnosis not present

## 2019-03-12 DIAGNOSIS — Z885 Allergy status to narcotic agent status: Secondary | ICD-10-CM | POA: Diagnosis not present

## 2019-03-12 DIAGNOSIS — K219 Gastro-esophageal reflux disease without esophagitis: Secondary | ICD-10-CM | POA: Diagnosis present

## 2019-03-12 DIAGNOSIS — F17210 Nicotine dependence, cigarettes, uncomplicated: Secondary | ICD-10-CM | POA: Diagnosis present

## 2019-03-12 DIAGNOSIS — K5000 Crohn's disease of small intestine without complications: Secondary | ICD-10-CM

## 2019-03-12 DIAGNOSIS — R112 Nausea with vomiting, unspecified: Secondary | ICD-10-CM

## 2019-03-12 DIAGNOSIS — Z9221 Personal history of antineoplastic chemotherapy: Secondary | ICD-10-CM | POA: Diagnosis not present

## 2019-03-12 DIAGNOSIS — R531 Weakness: Secondary | ICD-10-CM | POA: Diagnosis not present

## 2019-03-12 DIAGNOSIS — Z66 Do not resuscitate: Secondary | ICD-10-CM | POA: Diagnosis present

## 2019-03-12 DIAGNOSIS — Z9049 Acquired absence of other specified parts of digestive tract: Secondary | ICD-10-CM | POA: Diagnosis not present

## 2019-03-12 DIAGNOSIS — D509 Iron deficiency anemia, unspecified: Secondary | ICD-10-CM | POA: Diagnosis present

## 2019-03-12 DIAGNOSIS — E876 Hypokalemia: Secondary | ICD-10-CM | POA: Diagnosis not present

## 2019-03-12 DIAGNOSIS — Z79899 Other long term (current) drug therapy: Secondary | ICD-10-CM | POA: Diagnosis not present

## 2019-03-12 DIAGNOSIS — Z880 Allergy status to penicillin: Secondary | ICD-10-CM | POA: Diagnosis not present

## 2019-03-12 DIAGNOSIS — K30 Functional dyspepsia: Secondary | ICD-10-CM | POA: Diagnosis present

## 2019-03-12 DIAGNOSIS — Z888 Allergy status to other drugs, medicaments and biological substances status: Secondary | ICD-10-CM | POA: Diagnosis not present

## 2019-03-12 DIAGNOSIS — T451X5A Adverse effect of antineoplastic and immunosuppressive drugs, initial encounter: Secondary | ICD-10-CM | POA: Diagnosis present

## 2019-03-12 DIAGNOSIS — C182 Malignant neoplasm of ascending colon: Principal | ICD-10-CM

## 2019-03-12 DIAGNOSIS — I4510 Unspecified right bundle-branch block: Secondary | ICD-10-CM | POA: Diagnosis present

## 2019-03-12 DIAGNOSIS — E43 Unspecified severe protein-calorie malnutrition: Secondary | ICD-10-CM | POA: Diagnosis present

## 2019-03-12 DIAGNOSIS — Z681 Body mass index (BMI) 19 or less, adult: Secondary | ICD-10-CM | POA: Diagnosis not present

## 2019-03-12 DIAGNOSIS — Z85828 Personal history of other malignant neoplasm of skin: Secondary | ICD-10-CM | POA: Diagnosis not present

## 2019-03-12 DIAGNOSIS — Z20828 Contact with and (suspected) exposure to other viral communicable diseases: Secondary | ICD-10-CM | POA: Diagnosis present

## 2019-03-12 DIAGNOSIS — Z882 Allergy status to sulfonamides status: Secondary | ICD-10-CM | POA: Diagnosis not present

## 2019-03-12 LAB — CBC
HCT: 31.2 % — ABNORMAL LOW (ref 36.0–46.0)
Hemoglobin: 10.7 g/dL — ABNORMAL LOW (ref 12.0–15.0)
MCH: 31.6 pg (ref 26.0–34.0)
MCHC: 34.3 g/dL (ref 30.0–36.0)
MCV: 92 fL (ref 80.0–100.0)
Platelets: 144 10*3/uL — ABNORMAL LOW (ref 150–400)
RBC: 3.39 MIL/uL — ABNORMAL LOW (ref 3.87–5.11)
RDW: 17.3 % — ABNORMAL HIGH (ref 11.5–15.5)
WBC: 8.3 10*3/uL (ref 4.0–10.5)
nRBC: 0 % (ref 0.0–0.2)

## 2019-03-12 LAB — GI PATHOGEN PANEL BY PCR, STOOL

## 2019-03-12 LAB — COMPREHENSIVE METABOLIC PANEL
ALT: 13 U/L (ref 0–44)
AST: 13 U/L — ABNORMAL LOW (ref 15–41)
Albumin: 2.4 g/dL — ABNORMAL LOW (ref 3.5–5.0)
Alkaline Phosphatase: 56 U/L (ref 38–126)
Anion gap: 8 (ref 5–15)
BUN: 13 mg/dL (ref 8–23)
CO2: 18 mmol/L — ABNORMAL LOW (ref 22–32)
Calcium: 7.8 mg/dL — ABNORMAL LOW (ref 8.9–10.3)
Chloride: 107 mmol/L (ref 98–111)
Creatinine, Ser: 0.64 mg/dL (ref 0.44–1.00)
GFR calc Af Amer: >60 mL/min (ref >60)
GFR calc non Af Amer: >60 mL/min (ref >60)
Glucose, Bld: 101 mg/dL — ABNORMAL HIGH (ref 70–99)
Potassium: 3 mmol/L — ABNORMAL LOW (ref 3.5–5.1)
Sodium: 133 mmol/L — ABNORMAL LOW (ref 135–145)
Total Bilirubin: 1 mg/dL (ref 0.3–1.2)
Total Protein: 4.8 g/dL — ABNORMAL LOW (ref 6.5–8.1)

## 2019-03-12 LAB — MAGNESIUM: Magnesium: 2.1 mg/dL (ref 1.7–2.4)

## 2019-03-12 LAB — SARS CORONAVIRUS 2 (TAT 6-24 HRS): SARS Coronavirus 2: NEGATIVE

## 2019-03-12 MED ORDER — CHLORHEXIDINE GLUCONATE CLOTH 2 % EX PADS: 6.0000 | MEDICATED_PAD | Freq: Every day | CUTANEOUS | Status: DC

## 2019-03-12 MED ORDER — POTASSIUM CHLORIDE 10 MEQ/100ML IV SOLN
10.0000 meq | INTRAVENOUS | Status: AC
  Administered 2019-03-12 (×4): 10 meq via INTRAVENOUS
  Filled 2019-03-12 (×5): qty 100

## 2019-03-12 MED ORDER — LACTATED RINGERS IV SOLN
INTRAVENOUS | Status: AC
  Administered 2019-03-12 – 2019-03-13 (×2): via INTRAVENOUS

## 2019-03-12 MED ORDER — DIPHENHYDRAMINE HCL 25 MG PO CAPS
25.0000 mg | ORAL_CAPSULE | Freq: Three times a day (TID) | ORAL | Status: DC | PRN
  Administered 2019-03-12 – 2019-03-13 (×2): 25 mg via ORAL
  Filled 2019-03-12 (×2): qty 1

## 2019-03-12 MED ORDER — DIPHENHYDRAMINE HCL 50 MG/ML IJ SOLN
12.5000 mg | Freq: Once | INTRAMUSCULAR | Status: AC
  Administered 2019-03-12: 11:00:00 12.5 mg via INTRAVENOUS
  Filled 2019-03-12: qty 1

## 2019-03-12 NOTE — ED Notes (Signed)
AAOx3.  Skin warm and dry.  NAD 

## 2019-03-12 NOTE — ED Notes (Signed)
Attempting to find transport for patient to floor.

## 2019-03-12 NOTE — Progress Notes (Addendum)
PROGRESS NOTE    Mary Beltran  FIE:[id number] DOB: 1940/06/20 DOA: 03/11/2019  PCP: [name], MD    LOS - 0   Brief Narrative:  78 y.o. female with medical history significant of colon cancer s/p hemicolectomy and started on chemotherapy.  Presented to the ED today with intractable vomiting and diarrhea, inability to keep down any PO intake.  CT scan abdomen/pelvis on 12/1 showed distal ileitis but no other acute findings.  In the ED, tachycardic intermittently, borderline BP's 98/33, 105/49, afebrile.  Labs notable for Na 131, K 3.1, CO2 17, Mg 1.6, WBC 11.1k, troponin negative.  IV fluids were started with potassium repletion.  Patient admitted for IV hydration, antiemetics and GI consult.  Had started empiric Cipro and Flagyl on admission, given finding of ileitis, however patient developed hives and itching, so stopped antibiotics and will monitor clinically.    Subjective 12/3: Patient seen, awake with eyes closed.  Son at bedside.  She reports feeling a little better today.  Still has diarrhea but seems less frequent.  Was able to sip water, ate some ice cream yesterday evening, and did not have vomiting or diarrhea afterward.  This is improved.  Abdominal pain still there but improved as well. Denies fever/chills.  Has not vomited overnight or today.  Assessment & Plan:   Principal Problem:   Intractable nausea and vomiting Active Problems:   Hypokalemia   Hypomagnesemia   Diarrhea   Ileitis, terminal (HCC)   Cancer of right colon (HCC)   Intractable Nausea and Vomiting Diarrhea Distal Ileitis, on CT abdomen/pelvis 12/1 Suspect chemotherapy driving all of this, however, could be infectious as well.  ED workup on 12/1 - C diff negative.  CT abdomen/pelvis showed inflammatory or infectious mid/distal ileitis, no suspicious findings for tumor recurrence, no metastatic lesions in liver or lung bases or adenopathy.  Per patient's son, was told patient's chemo medication is  associated with ileitis(?). - GI pathogen panel - all negative - Zofran PRN  - IV hydration - stop Cipro/Flagyl given hives, monitor clinically - GI consult - pending - Protonix 40 mg IV BID for now  Hypokalemia - repleted  Hypomagnesemia - repleted - monitor electrolytes - maintain K>4, Mg>2  Colon Cancer, s/p hemicolectomy on chemotherapy - patient declining further chemo - follow with Dr. Tasia Catchings  DVT prophylaxis: Lovenox   Code Status: DNR  Family Communication: Son at bedside  Disposition Plan:  Expect d/c home in 1-2 days, once no longer requiring IV antiemetics.   Patient status changed to inpatient, as she continues to require IV antiemetics, has GI consultation pending.  Will require more than two midnights before medically stable for discharge.    Consultants:   Gastroenterology  Procedures:   None  Antimicrobials:   None  Cipro/Flagyl started 12/2, stopped 12/3    Objective: Vitals:   03/12/19 0145 03/12/19 0200 03/12/19 0418 03/12/19 0801  BP: (!) 94/52 (!) 97/51 (!) 112/47 (!) 96/51  Pulse: (!) 34 (!) 101 100 91  Resp: (!) 21 (!) 22 18 18   Temp:   98.6 F (37 C) 98 F (36.7 C)  TempSrc:   Oral Oral  SpO2: 98% 96% 95% 95%  Weight:      Height:        Intake/Output Summary (Last 24 hours) at 03/12/2019 1522 Last data filed at 03/12/2019 1306 Gross per 24 hour  Intake 1882.14 ml  Output -  Net 1882.14 ml   Filed Weights   03/11/19 1358  Weight: 54.4 kg    Examination:  General exam: awake, alert, no acute distress HEENT: dry mucus membranes, hearing grossly normal  Respiratory system: clear to auscultation bilaterally, no wheezes, rales or rhonchi, normal respiratory effort. Cardiovascular system: normal S1/S2, RRR, no JVD, murmurs, rubs, gallops, no pedal edema, chemo port present anterior right upper chest.   Gastrointestinal system: soft, non-tender, non-distended abdomen, hyperactive bowel sounds Central nervous system: alert and  oriented x4. no gross focal neurologic deficits, normal speech Extremities: moves all, no edema, normal tone Psychiatry: normal mood, flat affect, judgement and insight appear normal    Data Reviewed: I have personally reviewed following labs and imaging studies  CBC: Recent Labs  Lab 03/09/19 1500 03/10/19 1015 03/11/19 1415 03/12/19 0416  WBC 8.4 5.6 11.1* 8.3  NEUTROABS 5.7  --  7.2  --   HGB 12.5 12.6 11.8* 10.7*  HCT 37.9 36.9 33.6* 31.2*  MCV 94.8 92.3 88.4 92.0  PLT 181 179 164 379*   Basic Metabolic Panel: Recent Labs  Lab 03/09/19 1500 03/10/19 1015 03/11/19 1415 03/12/19 0416  NA 130* 134* 131* 133*  K 2.8* 3.8 3.1* 3.0*  CL 103 105 104 107  CO2 19* 18* 17* 18*  GLUCOSE 122* 133* 105* 101*  BUN 14 10 15 13   CREATININE 0.99 0.78 0.71 0.64  CALCIUM 8.6* 9.6 9.1 7.8*  MG 1.6*  --  1.6* 2.1   GFR: Estimated Creatinine Clearance: 51.4 mL/min (by C-G formula based on SCr of 0.64 mg/dL). Liver Function Tests: Recent Labs  Lab 03/09/19 1500 03/10/19 1015 03/11/19 1415 03/12/19 0416  AST 16 21 20  13*  ALT 14 15 16 13   ALKPHOS 71 67 65 56  BILITOT 0.7 0.7 0.7 1.0  PROT 6.4* 6.8 5.9* 4.8*  ALBUMIN 3.6 3.7 3.0* 2.4*   Recent Labs  Lab 03/10/19 1015  LIPASE 23   No results for input(s): AMMONIA in the last 168 hours. Coagulation Profile: Recent Labs  Lab 03/11/19 1415  INR 1.5*   Cardiac Enzymes: No results for input(s): CKTOTAL, CKMB, CKMBINDEX, TROPONINI in the last 168 hours. BNP (last 3 results) No results for input(s): PROBNP in the last 8760 hours. HbA1C: No results for input(s): HGBA1C in the last 72 hours. CBG: No results for input(s): GLUCAP in the last 168 hours. Lipid Profile: No results for input(s): CHOL, HDL, LDLCALC, TRIG, CHOLHDL, LDLDIRECT in the last 72 hours. Thyroid Function Tests: No results for input(s): TSH, T4TOTAL, FREET4, T3FREE, THYROIDAB in the last 72 hours. Anemia Panel: No results for input(s): VITAMINB12,  FOLATE, FERRITIN, TIBC, IRON, RETICCTPCT in the last 72 hours. Sepsis Labs: Recent Labs  Lab 03/11/19 1415  LATICACIDVEN 1.8    Recent Results (from the past 240 hour(s))  C difficile quick screen w PCR reflex     Status: None   Collection Time: 03/09/19  4:30 PM   Specimen: STOOL  Result Value Ref Range Status   C Diff antigen NEGATIVE NEGATIVE Final   C Diff toxin NEGATIVE NEGATIVE Final   C Diff interpretation No C. difficile detected.  Final    Comment: Performed at [facility], [address] [place], [place] [address]  GI pathogen panel by PCR, stool     Status: None   Collection Time: 03/09/19  4:30 PM   Specimen: Stool  Result Value Ref Range Status   Plesiomonas shigelloides NOT DETECTED NOT DETECTED Final   Yersinia enterocolitica NOT DETECTED NOT DETECTED Final   Vibrio NOT DETECTED NOT DETECTED  Final   Enteropathogenic E coli NOT DETECTED NOT DETECTED Final   E coli (ETEC) LT/ST NOT DETECTED NOT DETECTED Final   E coli 2330 by PCR Not applicable NOT DETECTED Final   Cryptosporidium by PCR NOT DETECTED NOT DETECTED Final   Entamoeba histolytica NOT DETECTED NOT DETECTED Final   Adenovirus F 40/41 NOT DETECTED NOT DETECTED Final   Norovirus GI/GII NOT DETECTED NOT DETECTED Final   Sapovirus NOT DETECTED NOT DETECTED Final    Comment: (NOTE) Performed At: [facility] [address] [place], [place] [id number] Rush Farmer MD LK:[id number]    Vibrio cholerae NOT DETECTED NOT DETECTED Final   Campylobacter by PCR NOT DETECTED NOT DETECTED Final   Salmonella by PCR NOT DETECTED NOT DETECTED Final   E coli (STEC) NOT DETECTED NOT DETECTED Final   Enteroaggregative E coli NOT DETECTED NOT DETECTED Final   Shigella by PCR NOT DETECTED NOT DETECTED Final   Cyclospora cayetanensis NOT DETECTED NOT DETECTED Final   Astrovirus NOT DETECTED NOT DETECTED Final   G lamblia by PCR NOT DETECTED NOT DETECTED Final   Rotavirus A by PCR NOT DETECTED NOT  DETECTED Final  Culture, blood (Routine x 2)     Status: None (Preliminary result)   Collection Time: 03/11/19  2:15 PM   Specimen: BLOOD  Result Value Ref Range Status   Specimen Description BLOOD BLOOD RIGHT FOREARM  Final   Special Requests   Final    BOTTLES DRAWN AEROBIC AND ANAEROBIC Blood Culture results may not be optimal due to an excessive volume of blood received in culture bottles   Culture   Final    NO GROWTH < 24 HOURS Performed at [facility], [address] [place], [place] [address]    Report Status PENDING  Incomplete  Culture, blood (Routine x 2)     Status: None (Preliminary result)   Collection Time: 03/11/19  3:03 PM   Specimen: BLOOD  Result Value Ref Range Status   Specimen Description BLOOD BLOOD LEFT FOREARM  Final   Special Requests   Final    BOTTLES DRAWN AEROBIC AND ANAEROBIC Blood Culture adequate volume   Culture   Final    NO GROWTH < 24 HOURS Performed at [facility], [address] [place], [place] [address]    Report Status PENDING  Incomplete  SARS CORONAVIRUS 2 (TAT 6-24 HRS) Nasopharyngeal Nasopharyngeal Swab     Status: None   Collection Time: 03/11/19  3:51 PM   Specimen: Nasopharyngeal Swab  Result Value Ref Range Status   SARS Coronavirus 2 NEGATIVE NEGATIVE Final    Comment: (NOTE) SARS-CoV-2 target nucleic acids are NOT DETECTED. The SARS-CoV-2 RNA is generally detectable in upper and lower respiratory specimens during the acute phase of infection. Negative results do not preclude SARS-CoV-2 infection, do not rule out co-infections with other pathogens, and should not be used as the sole basis for treatment or other patient management decisions. Negative results must be combined with clinical observations, patient history, and epidemiological information. The expected result is Negative. Fact Sheet for Patients: SugarRoll.be Fact Sheet for Healthcare Providers:  https://www.woods-mathews.com/ This test is not yet approved or cleared by the [place] FDA and  has been authorized for detection and/or diagnosis of SARS-CoV-2 by FDA under an Emergency Use Authorization (EUA). This EUA will remain  in effect (meaning this test can be used) for the duration of the COVID-19 declaration under Section 56 4(b)(1) of the Act, 21 U.S.C. section  360bbb-3(b)(1), unless the authorization is terminated or revoked sooner. Performed at [facility], [place] [address] [place], [place] [address]          Radiology Studies: No results found.      Scheduled Meds: . [START ON 03/13/2019] Chlorhexidine Gluconate Cloth  6 each Topical Q0600  . enoxaparin (LOVENOX) injection  40 mg Subcutaneous Q24H  . pantoprazole (PROTONIX) IV  40 mg Intravenous Q12H   Continuous Infusions: . lactated ringers    . potassium chloride 10 mEq (03/12/19 1518)     LOS: 0 days    Time spent: 30-35 minutes    Ezekiel Slocumb, DO [facility] Pager: [phone]  If 7PM-7AM, please contact night-coverage [email or web address] Password [email or web address] 03/12/2019, 3:22 PM

## 2019-03-12 NOTE — ED Notes (Signed)
Pt ambulatory to restroom

## 2019-03-12 NOTE — Progress Notes (Signed)
Do [place] Ped made aware that pt has a red hive itchy rash mainly on arms, legs, buttocks, new orders to d/c iv antibiotics and to give benadryl IV, Griffith up to floor to see pt, son at bedside

## 2019-03-12 NOTE — Consult Note (Signed)
Hematology/Oncology Consult note [facility] [phone] [phone]  Patient Care Team: [name], MD as PCP - General (Internal Medicine) Clent Jacks, RN as Oncology Nurse Navigator   Name of the patient: Mary Beltran  [id number]  October 10, 1940   Date of visit: 03/12/19 REASON FOR COSULTATION:  Nausea vomiting diarrhea History of presenting illness-  78 y.o. female with PMH listed at below who is known to me for Stage IIIB colon cancer, on adjuvant chemotherapy with 5-FU.  Patient was advised by me to go to emergency room for evaluation of nausea, vomiting, diarrhea. Patient has a history of stage IIIb colon cancer status post hemicolectomy and has been on adjuvant chemotherapy with 5-FU with a goal to reduce recurrence rate. Last chemotherapy was about 2 weeks ago.  She has previously tolerated a few cycles of 5-FU with mild difficulties including fatigue, mild hand-foot syndrome, but no significant gastroenterology symptoms. Patient developed acute onset of dark liquid diarrhea on 03/07/2019, she called cancer center on 03/09/2019 and was evaluated by symptom management clinic virtually by nurse practitioner on 03/09/2019.  Patient had blood work done and received supportive care including antiemetics, IV fluid, electrolyte replacement..  Per NP Faythe Casa, patient was advised to go to emergency room but preferred to be managed outpatient.  Outpatient Stat CT scan was ordered and scheduled on 03/10/2019.  03/10/2019, patient presented to symptom management for follow-up evaluation and additional blood work.  Patient was evaluated by NP Alen Lauren.  Patient continues to have severe symptoms including profound weakness, watery diarrhea, nausea and vomiting, not able to tolerate liquids or solids.  Also reports intermittent epigastric pain. There was concern of potential bowel obstruction.  Patient was recommended to go to emergency  room for further evaluation and stat CT scan.  Patient went to emergency room and was seen by Dr. Jimmye Norman.  CT abdomen pelvis with contrast showed inflammatory/infectious mild to distal ileitis.  No inflammatory changes involving colon.  There is moderate amount of fluid throughout the colon typical for diarrhea.  No findings suspicious for recurrent tumor.  Patient was given supportive care and offered to be admitted.  Patient felt better at that time and chose to go home. Patient son called on 03/11/2019.  He reports that patient is doing poorly, continue to have significant symptoms, not able to tolerate any p.o. recommend son to bring patient to emergency room immediately for further evaluation. Please refer to the details documented in my telephone note on 03/10/2019.  Patient was admitted yesterday and started on supportive care including antiemetics, IV fluid, electrolyte replacement, Protonix.  GI was consulted.  Oncology was consulted. Etiology of nausea vomiting diarrhea, chemotherapy side effects versus infectious etiology, or both. Patient was started on empiric Cipro/Flagyl in case of possible infectious ileitis. Patient was seen today.  Son Mr. Mary Beltran is at the bedside.  Patient subjectively feels improved since admission.  No additional watery bowel movement today. Nausea has improved.  Diet was advanced to soft food. She asked son to show me a picture of her hands in the cell phone.  Picture illustrates erythema appearance of bilateral palm. Currently she denies any redness on her hands.  Erythema has resolved.  Patient informs me that she is not interested in proceeding with any additional chemotherapy. Continues to feel weak.  She continues to have some diffuse abdominal discomfort.  Review of Systems  Constitutional: Positive for appetite change and fatigue. Negative for chills and fever.  HENT:  Negative for hearing loss and voice change.   Eyes: Negative for eye problems.    Respiratory: Negative for chest tightness and cough.   Cardiovascular: Negative for chest pain.  Gastrointestinal: Positive for abdominal pain, diarrhea and nausea. Negative for abdominal distention and blood in stool.  Endocrine: Negative for hot flashes.  Genitourinary: Negative for difficulty urinating and frequency.   Musculoskeletal: Negative for neck pain.  Skin: Negative for wound.  Hematological: Negative for adenopathy.  Psychiatric/Behavioral: Negative for confusion.    Allergies  Allergen Reactions   Pain & Fever [Acetaminophen]     Patient states all pain medications make her "vomit"   Sulfa Antibiotics Nausea And Vomiting   Ciprofloxacin Hives and Itching   Codeine Nausea And Vomiting   Penicillins Other (See Comments)    Did it involve swelling of the face/tongue/throat, SOB, or low BP? Yes Did it involve sudden or severe rash/hives, skin peeling, or any reaction on the inside of your mouth or nose? Yes Did you need to seek medical attention at a hospital or doctor's office? Yes When did it last happen?in her 90s If all above answers are NO, may proceed with cephalosporin use.     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     Past Medical History:  Diagnosis Date   Cancer ([place])    Complication of anesthesia    NOT WITH GB   GERD (gastroesophageal reflux disease)     IDA (iron deficiency anemia) 10/13/2018   PONV (postoperative nausea and vomiting)      Past Surgical History:  Procedure Laterality Date   ABDOMINAL HYSTERECTOMY     complete   APPENDECTOMY     CHOLECYSTECTOMY N/A 08/22/2018   Procedure: LAPAROSCOPIC CHOLECYSTECTOMY;  Surgeon: Herbert Pun, MD;  Location: [facility] ORS;  Service: General;  Laterality: N/A;   COLONOSCOPY     LAPAROSCOPIC RIGHT COLECTOMY N/A 10/23/2018   Procedure: LAPAROSCOPIC HAND ASSISTED RIGHT COLECTOMY;  Surgeon: Herbert Pun, MD;  Location: [facility] ORS;  Service: General;  Laterality: N/A;   PERCUTANEOUS PINNING WRIST FRACTURE Right    PORTACATH PLACEMENT Right 12/08/2018   Procedure: INSERTION PORT-A-CATH, CENTRAL VENOUS CATH WITH SUBCUTANEOUS INFUSION PORT;  Surgeon: Herbert Pun, MD;  Location: [facility] ORS;  Service: General;  Laterality: Right;   WRIST FOREIGN BODY REMOVAL Right    2016    Social History   Socioeconomic History   Marital status: Divorced    Spouse name: Not on file   Number of children: Not on file   Years of education: Not on file   Highest education level: Not on file  Occupational History   Occupation: retired  Scientist, product/process development strain: Not on file   Food insecurity    Worry: Not on file    Inability: Not on file   Transportation needs    Medical: Not on file    Non-medical: Not on file  Tobacco Use   Smoking status: Current Every  Day Smoker    Packs/day: 0.25    Years: 60.00    Pack years: 15.00    Types: Cigarettes   Smokeless tobacco: Never Used   Tobacco comment: Patient states that this is the only thing she enjoys  Substance and Sexual Activity   Alcohol use: No   Drug use: Not Currently   Sexual activity: Not Currently  Lifestyle   Physical activity    Days per week: Not on file    Minutes per session: Not on file   Stress: Not on file  Relationships   Social connections    Talks on phone: Not on file     Gets together: Not on file    Attends religious service: Not on file    Active member of club or organization: Not on file    Attends meetings of clubs or organizations: Not on file    Relationship status: Not on file   Intimate partner violence    Fear of current or ex partner: Not on file    Emotionally abused: Not on file    Physically abused: Not on file    Forced sexual activity: Not on file  Other Topics Concern   Not on file  Social History Narrative   Not on file     Family History  Problem Relation Age of Onset   COPD Mother    Suicidality Father    Breast cancer Paternal Grandmother    Failure to thrive Sister      Current Facility-Administered Medications:    bisacodyl (DULCOLAX) EC tablet 5 mg, 5 mg, Oral, Daily PRN, Ezekiel Slocumb, DO   [START ON 03/13/2019] Chlorhexidine Gluconate Cloth 2 % PADS 6 each, 6 each, Topical, Q0600, Nicole Kindred A, DO   diphenhydrAMINE (BENADRYL) capsule 25 mg, 25 mg, Oral, Q8H PRN, Nicole Kindred A, DO   enoxaparin (LOVENOX) injection 40 mg, 40 mg, Subcutaneous, Q24H, Griffith, Kelly A, DO   fentaNYL (SUBLIMAZE) injection 50 mcg, 50 mcg, Intravenous, Q30 min PRN, Lilia Pro., MD, 50 mcg at 03/11/19 1424   lactated ringers infusion, , Intravenous, Continuous, Arbutus Ped, Kelly A, DO   magnesium citrate solution 1 Bottle, 1 Bottle, Oral, Once PRN, Nicole Kindred A, DO   ondansetron (ZOFRAN) tablet 4 mg, 4 mg, Oral, Q6H PRN **OR** ondansetron (ZOFRAN) injection 4 mg, 4 mg, Intravenous, Q6H PRN, Nicole Kindred A, DO   pantoprazole (PROTONIX) injection 40 mg, 40 mg, Intravenous, Q12H, Nicole Kindred A, DO, 40 mg at 03/12/19 1121   polyethylene glycol (MIRALAX / GLYCOLAX) packet 17 g, 17 g, Oral, Daily PRN, Nicole Kindred A, DO   traZODone (DESYREL) tablet 25 mg, 25 mg, Oral, QHS PRN, Ezekiel Slocumb, DO   Physical exam:  Vitals:   03/12/19 0145 03/12/19 0200 03/12/19 0418 03/12/19 0801  BP: (!) 94/52 (!)  97/51 (!) 112/47 (!) 96/51  Pulse: (!) 34 (!) 101 100 91  Resp: (!) 21 (!) 22 18 18   Temp:   98.6 F (37 C) 98 F (36.7 C)  TempSrc:   Oral Oral  SpO2: 98% 96% 95% 95%  Weight:      Height:       Physical Exam  Constitutional: She is oriented to person, place, and time. No distress.  HENT:  Head: Normocephalic and atraumatic.  Mouth/Throat: No oropharyngeal exudate.  Eyes: Pupils are equal, round, and reactive to light. EOM are normal. No scleral icterus.  Neck: Normal range of motion. Neck supple.  Cardiovascular: Normal rate  and regular rhythm.  No murmur heard. Pulmonary/Chest: Effort normal and breath sounds normal. No respiratory distress.  No wheezing bilaterally on anterior chest wall auscultation.  Abdominal: Soft. Bowel sounds are normal. She exhibits no distension.  Diffuse tenderness with gentle palpation.  Musculoskeletal:        General: No edema.  Neurological: She is alert and oriented to person, place, and time.  Skin: Skin is dry. No erythema.  Psychiatric: Mood normal.        CMP Latest Ref Rng & Units 03/12/2019  Glucose 70 - 99 mg/dL 101(H)  BUN 8 - 23 mg/dL 13  Creatinine 0.44 - 1.00 mg/dL 0.64  Sodium 135 - 145 mmol/L 133(L)  Potassium 3.5 - 5.1 mmol/L 3.0(L)  Chloride 98 - 111 mmol/L 107  CO2 22 - 32 mmol/L 18(L)  Calcium 8.9 - 10.3 mg/dL 7.8(L)  Total Protein 6.5 - 8.1 g/dL 4.8(L)  Total Bilirubin 0.3 - 1.2 mg/dL 1.0  Alkaline Phos 38 - 126 U/L 56  AST 15 - 41 U/L 13(L)  ALT 0 - 44 U/L 13   CBC Latest Ref Rng & Units 03/12/2019  WBC 4.0 - 10.5 K/uL 8.3  Hemoglobin 12.0 - 15.0 g/dL 10.7(L)  Hematocrit 36.0 - 46.0 % 31.2(L)  Platelets 150 - 400 K/uL 144(L)   RADIOGRAPHIC STUDIES: I have personally reviewed the radiological images as listed and agreed with the findings in the report.  Ct Abdomen Pelvis W Contrast  Result Date: 03/10/2019 CLINICAL DATA:  Abdominal pain, diarrhea and vomiting for 2-3 days. History of colon cancer and status  post partial right hemicolectomy. EXAM: CT ABDOMEN AND PELVIS WITH CONTRAST TECHNIQUE: Multidetector CT imaging of the abdomen and pelvis was performed using the standard protocol following bolus administration of intravenous contrast. CONTRAST:  34m OMNIPAQUE IOHEXOL 300 MG/ML  SOLN COMPARISON:  CT scan and 10/09/2018 FINDINGS: Lower chest: The lung bases are clear of acute process. No pleural effusion or pulmonary lesions. The heart is normal in size. No pericardial effusion. The distal esophagus and aorta are unremarkable. Hepatobiliary: No focal hepatic lesions to suggest hepatic metastatic disease. The portal and hepatic veins are patent. The gallbladder surgically absent. No common bile duct dilatation. Pancreas: No mass, inflammation or ductal dilatation. Spleen: Normal size.  No focal lesions. Adrenals/Urinary Tract: Stable nodule involving the medial limb of the left adrenal gland, unchanged since 2017 and consistent with a benign adenoma. No renal lesions or hydronephrosis.  The bladder appears normal. Stomach/Bowel: The stomach and duodenum are unremarkable. The proximal jejunal loops of small bowel appear normal. The mid distal ileal loops of small bowel are abnormal. There is wall thickening and submucosal edema along with serosal and mucosal enhancement consistent with an inflammatory or infectious ileitis. This extends all the way to the ileal colonic anastomosis. No inflammatory changes involving the colon are identified but there is a moderate amount of fluid throughout the colon typical with diarrhea. Vascular/Lymphatic: Advanced atherosclerotic calcifications involving the abdominal aorta and iliac arteries and branch vessels. No focal aneurysm or dissection. The major venous structures are patent. No mesenteric or retroperitoneal mass or adenopathy. Reproductive: Surgically absent. Other: No pelvic mass or pelvic lymphadenopathy. No free pelvic fluid collections. No inguinal mass or adenopathy.  Musculoskeletal: No significant bony findings. IMPRESSION: 1. CT findings consistent with an inflammatory or infectious mid distal ileitis. No inflammatory changes involving the colon but there is a moderate amount of fluid throughout the colon typical with diarrhea. 2. Status post partial right hemicolectomy. No  findings suspicious for recurrent tumor. 3. No findings for metastatic disease involving liver or lung bases. No mesenteric or retroperitoneal lymphadenopathy. Electronically Signed   By: Marijo Sanes M.D.   On: 03/10/2019 11:27    Assessment and plan- Patient is a 78 y.o. female who has a history of stage IIIb colon cancer, on adjuvant chemotherapy with 5-FU, last chemotherapy about 2 weeks ago presented for evaluation of sudden onset of nausea vomiting and watery diarrhea since 03/07/2019.  #Nausea vomiting diarrhea Symptom is acute onset, not immediately after, or during the middle of each cycle.  Onset was close to next chemotherapy. This is an unusual pattern of chemotherapy induced side effects, although it is possible for accumulated toxicities.  She tolerated previous cycles fairly where with minimal GI symptoms. I suspect infectious etiology, may be on top of chemotherapy side effects.  C. difficile is negative.  GI panel negative. I agree with supportive care with IV fluid, antiemetics, antidiarrhea, electrolyte monitoring and replacement. Clinically she improved.  Her blood pressure remains borderline.  Tachycardia has improved.  Patient informs me that she is not interested in any additional chemotherapy.  We discussed that she has stage IIIB colon cancer,(T4 disease, lymphovascular invasion), which is associated with high recurrence rate., stopping chemotherapy maybe related to increased recurrence risk. Patient voices understanding of the potential increased risk and has decided not to proceed with any additional chemotherapy.  Patient's son Mr. Mary Beltran agrees with patient's  decision. I respect patient's decision and would not proceed with additional chemotherapy.  Son said: You previously said that chemotherapy nowadays is not as bad as it was in the old times. Son feels that if they knew patient would be this sick and experience side effects, she would not want it.  I discussed with patient that I said chemotherapy is not as bad as it was in the old times, because there are better medication, ie antiemetics to treat chemotherapy side effects. In addition, with close monitoring and adequate supportive care including IV fluid, antiemetics, antidiarrhea etc, most patients tolerate treatments. I emphasized on possibilities of severe side effects despite our efforts. I did emphasize before the start of the chemotherapy that I can not guarantee that treat is completely safe. Patient was agreeable with chemotherapy and was fully consented.  Indeed patient did really well for her first few cycles. Son appreciates explanation.  I informed patient and son that I will continue to be involved in her care during current admission if she is OK. If she prefers to change provider now, I will let cancer center know. And after she is discharged, [facility] Director Erline Levine will discuss with them before she follows up with me in the clinic.    Thank you for allowing me to participate in the care of this patient.  Total face to face encounter time for this patient visit was 70 min. >50% of the time was  spent in counseling and coordination of care.    [name], MD, PhD Hematology Oncology [facility] Pager- [phone] 03/12/2019

## 2019-03-12 NOTE — Consult Note (Signed)
Mary Antigua, MD [address], [place], [place], [place], [address] [id number], [place], [place], [place], [address] Phone: [phone]  Fax: [phone]  Consultation  Referring Provider:     Dr. Arbutus Ped Primary Care Physician:  Mary Ruths, MD Reason for Consultation:     Nausea vomiting diarrhea  Date of Admission:  03/11/2019 Date of Consultation:  03/12/2019         HPI:   Mary Beltran is a 78 y.o. female with history of stage IIIb colon cancer diagnosed this year, status post right hemicolectomy, sees Dr. Alice Beltran, on adjuvant chemotherapy with 5-FU, presents with nausea vomiting and diarrhea.  Last chemotherapy 2 weeks ago.  Started having liquid diarrhea about 4 to 5 days ago.  Due to weakness and watery diarrhea along with nausea vomiting patient was advised to go to the hospital by outpatient provider.  No blood in stool.  States diarrhea is better today.  Has had 3 bowel movements today.  Compared to multiple bowel movements a day when symptoms first started.  Antibiotics were given on admission but patient had an allergic reaction and these were stopped.  Patient afebrile.  Also has a history of laparoscopic cholecystectomy this year in May 2020.  CT on this admission showed wall thickening and submucosal edema of the mid and distal ileal loops suggestive of inflammatory or infectious ileitis.  This extends to the ileocolonic anastomosis.  No inflammatory changes in the colon.  GI panel and C. difficile negative on March 09, 2019  Past Medical History:  Diagnosis Date  . Cancer ([place])   . Complication of anesthesia    NOT WITH GB  . GERD (gastroesophageal reflux disease)   . IDA (iron deficiency anemia) 10/13/2018  . PONV (postoperative nausea and vomiting)     Past Surgical History:  Procedure Laterality Date  . ABDOMINAL HYSTERECTOMY     complete  . APPENDECTOMY    . CHOLECYSTECTOMY N/A 08/22/2018   Procedure: LAPAROSCOPIC CHOLECYSTECTOMY;   Surgeon: Mary Pun, MD;  Location: [facility] ORS;  Service: General;  Laterality: N/A;  . COLONOSCOPY    . LAPAROSCOPIC RIGHT COLECTOMY N/A 10/23/2018   Procedure: LAPAROSCOPIC HAND ASSISTED RIGHT COLECTOMY;  Surgeon: Mary Pun, MD;  Location: [facility] ORS;  Service: General;  Laterality: N/A;  . PERCUTANEOUS PINNING WRIST FRACTURE Right   . PORTACATH PLACEMENT Right 12/08/2018   Procedure: INSERTION PORT-A-CATH, CENTRAL VENOUS CATH WITH SUBCUTANEOUS INFUSION PORT;  Surgeon: Mary Pun, MD;  Location: [facility] ORS;  Service: General;  Laterality: Right;  . WRIST FOREIGN BODY REMOVAL Right    2016    Prior to Admission medications   Medication Sig Start Date End Date Taking? Authorizing Provider  lidocaine-prilocaine (EMLA) cream Apply to affected area once Patient not taking: Reported on 03/11/2019 11/07/18   [name], MD  ondansetron (ZOFRAN) 8 MG tablet Take 1 tablet (8 mg total) by mouth 2 (two) times daily as needed (Nausea or vomiting). Patient not taking: Reported on 03/11/2019 11/07/18   [name], MD  potassium chloride SA (KLOR-CON) 20 MEQ tablet Take 2 tablets (40 mEq total) by mouth 2 (two) times daily. Patient not taking: Reported on 03/11/2019 03/09/19   Jacquelin Hawking, NP  prochlorperazine (COMPAZINE) 10 MG tablet Take 1 tablet (10 mg total) by mouth every 6 (six) hours as needed (Nausea or vomiting). Patient not taking: Reported on 03/11/2019 11/07/18   [name], MD    Family History  Problem Relation Age of Onset  .  COPD Mother   . Suicidality Father   . Breast cancer Paternal Grandmother   . Failure to thrive Sister      Social History   Tobacco Use  . Smoking status: Current Every Day Smoker    Packs/day: 0.25    Years: 60.00    Pack years: 15.00    Types: Cigarettes  . Smokeless tobacco: Never Used  . Tobacco comment: Patient states that this is the only thing she enjoys  Substance Use Topics  . Alcohol use: No  . Drug use: Not Currently     Allergies as of 03/11/2019 - Review Complete 03/11/2019  Allergen Reaction Noted  . Pain & fever [acetaminophen]  08/22/2018  . Sulfa antibiotics Nausea And Vomiting 08/22/2018  . Codeine Nausea And Vomiting 04/23/2015  . Penicillins Other (See Comments) 04/23/2015    Review of Systems:    All systems reviewed and negative except where noted in HPI.   Physical Exam:  Vital signs in last 24 hours: Vitals:   03/12/19 0145 03/12/19 0200 03/12/19 0418 03/12/19 0801  BP: (!) 94/52 (!) 97/51 (!) 112/47 (!) 96/51  Pulse: (!) 34 (!) 101 100 91  Resp: (!) 21 (!) 22 18 18   Temp:   98.6 F (37 C) 98 F (36.7 C)  TempSrc:   Oral Oral  SpO2: 98% 96% 95% 95%  Weight:      Height:       Last BM Date: 03/12/19 General:   Pleasant, cooperative in NAD Head:  Normocephalic and atraumatic. Eyes:   No icterus.   Conjunctiva pink. PERRLA. Ears:  Normal auditory acuity. Neck:  Supple; no masses or thyroidomegaly Lungs: Respirations even and unlabored. Lungs clear to auscultation bilaterally.   No wheezes, crackles, or rhonchi.  Abdomen:  Soft, nondistended, nontender. Normal bowel sounds. No appreciable masses or hepatomegaly.  No rebound or guarding.  Neurologic:  Alert and oriented x3;  grossly normal neurologically. Skin:  Intact without significant lesions or rashes. Cervical Nodes:  No significant cervical adenopathy. Psych:  Alert and cooperative. Normal affect.  LAB RESULTS: Recent Labs    03/10/19 1015 03/11/19 1415 03/12/19 0416  WBC 5.6 11.1* 8.3  HGB 12.6 11.8* 10.7*  HCT 36.9 33.6* 31.2*  PLT 179 164 144*   BMET Recent Labs    03/10/19 1015 03/11/19 1415 03/12/19 0416  NA 134* 131* 133*  K 3.8 3.1* 3.0*  CL 105 104 107  CO2 18* 17* 18*  GLUCOSE 133* 105* 101*  BUN 10 15 13   CREATININE 0.78 0.71 0.64  CALCIUM 9.6 9.1 7.8*   LFT Recent Labs    03/12/19 0416  PROT 4.8*  ALBUMIN 2.4*  AST 13*  ALT 13  ALKPHOS 56  BILITOT 1.0   PT/INR Recent Labs     03/11/19 1415  LABPROT 18.3*  INR 1.5*    STUDIES: No results found.    Impression / Plan:   [name] is a 78 y.o. y/o female with history of stage IIIb colon cancer, status post resection, currently on 5-FU chemotherapy with nausea vomiting and diarrhea with CT showing wall thickening in the mid to distal ileum  Her symptoms may be related to infectious process, versus chemotherapy-induced diarrhea  Symptoms are now improving and she does not have any elevated white count or fevers which is reassuring  Given her allergic reaction to antibiotics, okay to hold off on antibiotics at this time and monitor.  If symptoms recur or do not resolve, alternative antibiotics can  be considered  Patient has chosen not to proceed with any further chemotherapy as per oncology note as well  Would recommend IV fluids and hydration as appropriate to avoid dehydration  Use antidiarrheals as needed since C. difficile is negative  Avoid foods with sugars as this can cause further diarrhea  Consider repeat CT scan in 4 to 6 weeks to compare to current CT scan showing small bowel thickening  Thank you for involving me in the care of this patient.      LOS: 0 days   Virgel Manifold, MD  03/12/2019, 3:45 PM

## 2019-03-13 ENCOUNTER — Inpatient Hospital Stay: Payer: Medicare HMO

## 2019-03-13 DIAGNOSIS — E43 Unspecified severe protein-calorie malnutrition: Secondary | ICD-10-CM | POA: Insufficient documentation

## 2019-03-13 LAB — BASIC METABOLIC PANEL
Anion gap: 4 — ABNORMAL LOW (ref 5–15)
Anion gap: 5 (ref 5–15)
BUN: 7 mg/dL — ABNORMAL LOW (ref 8–23)
BUN: 6 mg/dL — ABNORMAL LOW (ref 8–23)
CO2: 22 mmol/L (ref 22–32)
CO2: 20 mmol/L — ABNORMAL LOW (ref 22–32)
Calcium: 7.4 mg/dL — ABNORMAL LOW (ref 8.9–10.3)
Calcium: 7.5 mg/dL — ABNORMAL LOW (ref 8.9–10.3)
Chloride: 107 mmol/L (ref 98–111)
Chloride: 107 mmol/L (ref 98–111)
Creatinine, Ser: 0.56 mg/dL (ref 0.44–1.00)
Creatinine, Ser: 0.54 mg/dL (ref 0.44–1.00)
GFR calc Af Amer: >60 mL/min (ref >60)
GFR calc Af Amer: >60 mL/min (ref >60)
GFR calc non Af Amer: >60 mL/min (ref >60)
GFR calc non Af Amer: >60 mL/min (ref >60)
Glucose, Bld: 107 mg/dL — ABNORMAL HIGH (ref 70–99)
Glucose, Bld: 116 mg/dL — ABNORMAL HIGH (ref 70–99)
Potassium: 3 mmol/L — ABNORMAL LOW (ref 3.5–5.1)
Potassium: 4.4 mmol/L (ref 3.5–5.1)
Sodium: 133 mmol/L — ABNORMAL LOW (ref 135–145)
Sodium: 132 mmol/L — ABNORMAL LOW (ref 135–145)

## 2019-03-13 LAB — CBC
HCT: 29.4 % — ABNORMAL LOW (ref 36.0–46.0)
Hemoglobin: 9.9 g/dL — ABNORMAL LOW (ref 12.0–15.0)
MCH: 30.8 pg (ref 26.0–34.0)
MCHC: 33.7 g/dL (ref 30.0–36.0)
MCV: 91.6 fL (ref 80.0–100.0)
Platelets: 148 10*3/uL — ABNORMAL LOW (ref 150–400)
RBC: 3.21 MIL/uL — ABNORMAL LOW (ref 3.87–5.11)
RDW: 17 % — ABNORMAL HIGH (ref 11.5–15.5)
WBC: 8.4 10*3/uL (ref 4.0–10.5)
nRBC: 0 % (ref 0.0–0.2)

## 2019-03-13 LAB — MAGNESIUM: Magnesium: 1.8 mg/dL (ref 1.7–2.4)

## 2019-03-13 MED ORDER — LOPERAMIDE HCL 2 MG PO CAPS
2.0000 mg | ORAL_CAPSULE | ORAL | Status: DC | PRN
  Filled 2019-03-13: qty 1

## 2019-03-13 MED ORDER — ADULT MULTIVITAMIN W/MINERALS CH: 1.0000 | ORAL_TABLET | Freq: Every day | ORAL | Status: DC

## 2019-03-13 MED ORDER — BOOST / RESOURCE BREEZE PO LIQD CUSTOM: 1.0000 | Freq: Three times a day (TID) | ORAL | Status: DC

## 2019-03-13 MED ORDER — POTASSIUM CHLORIDE 10 MEQ/100ML IV SOLN
10.0000 meq | INTRAVENOUS | Status: DC
  Administered 2019-03-13: 08:00:00 10 meq via INTRAVENOUS
  Filled 2019-03-13 (×2): qty 100

## 2019-03-13 MED ORDER — LOPERAMIDE HCL 2 MG PO CAPS
4.0000 mg | ORAL_CAPSULE | Freq: Once | ORAL | Status: AC
  Administered 2019-03-13: 08:00:00 4 mg via ORAL
  Filled 2019-03-13: qty 2

## 2019-03-13 MED ORDER — POTASSIUM CHLORIDE 20 MEQ PO PACK: 20.0000 meq | PACK | Freq: Two times a day (BID) | ORAL | Status: DC

## 2019-03-13 MED ORDER — HEPARIN SOD (PORK) LOCK FLUSH 100 UNIT/ML IV SOLN
500.0000 [IU] | Freq: Once | INTRAVENOUS | Status: AC
  Administered 2019-03-13: 19:00:00 500 [IU] via INTRAVENOUS
  Filled 2019-03-13: qty 5

## 2019-03-13 MED ORDER — POTASSIUM CHLORIDE 2 MEQ/ML IV SOLN
INTRAVENOUS | Status: DC
  Administered 2019-03-13: 10:00:00 via INTRAVENOUS
  Filled 2019-03-13 (×3): qty 1000

## 2019-03-13 MED ORDER — POTASSIUM CHLORIDE 20 MEQ PO PACK: 20.0000 meq | PACK | Freq: Two times a day (BID) | ORAL | 0 refills | Status: DC

## 2019-03-13 MED ORDER — LOPERAMIDE HCL 2 MG PO CAPS: 2.0000 mg | ORAL_CAPSULE | ORAL | 0 refills | Status: DC | PRN

## 2019-03-13 MED ORDER — ADULT MULTIVITAMIN W/MINERALS CH
1.0000 | ORAL_TABLET | Freq: Every day | ORAL | Status: DC
  Administered 2019-03-13: 15:00:00 1 via ORAL
  Filled 2019-03-13: qty 1

## 2019-03-13 MED ORDER — KCL-LACTATED RINGERS 20 MEQ/L IV SOLN
INTRAVENOUS | Status: DC
  Filled 2019-03-13 (×2): qty 1000

## 2019-03-13 MED ORDER — KCL-LACTATED RINGERS 20 MEQ/L IV SOLN: INTRAVENOUS | Status: DC

## 2019-03-13 MED ORDER — DIPHENHYDRAMINE HCL 25 MG PO CAPS: 25.0000 mg | ORAL_CAPSULE | Freq: Three times a day (TID) | ORAL | 0 refills | Status: DC | PRN

## 2019-03-13 MED ORDER — SODIUM CHLORIDE 0.9% FLUSH: 10.0000 mL | Freq: Two times a day (BID) | INTRAVENOUS | Status: DC

## 2019-03-13 MED ORDER — POTASSIUM CHLORIDE 20 MEQ PO PACK
40.0000 meq | PACK | Freq: Once | ORAL | Status: AC
  Administered 2019-03-13: 11:00:00 40 meq via ORAL
  Filled 2019-03-13: qty 2

## 2019-03-13 MED ORDER — LOPERAMIDE HCL 2 MG PO CAPS: 2.0000 mg | ORAL_CAPSULE | ORAL | Status: DC | PRN

## 2019-03-13 MED ORDER — POTASSIUM CHLORIDE 10 MEQ/100ML IV SOLN
10.0000 meq | INTRAVENOUS | Status: AC
  Administered 2019-03-13 (×3): 10 meq via INTRAVENOUS
  Filled 2019-03-13 (×2): qty 100

## 2019-03-13 NOTE — Progress Notes (Signed)
Initial Nutrition Assessment  DOCUMENTATION CODES:   Severe malnutrition in context of chronic illness  INTERVENTION:  -Boost Breeze po TID, each supplement provides 250 kcal and 9 grams of protein  -Magic cup BID with meals, each supplement provides 290 kcal and 9 grams of protein  -MVI with minerals daily  -Education and handouts provided  NUTRITION DIAGNOSIS:   Severe Malnutrition related to chronic illness(stage IIIb colon cancer s/p right hemicolectomy; currently on adjuvent chemotherapy) as evidenced by percent weight loss, moderate fat depletion, severe fat depletion, moderate muscle depletion, severe muscle depletion.   GOAL:   Patient will meet greater than or equal to 90% of their needs    MONITOR:   PO intake, Supplement acceptance, I & O's, Diet advancement, Labs, Weight trends, Skin  REASON FOR ASSESSMENT:   Malnutrition Screening Tool    ASSESSMENT:  78 year old female with past medical history of GERD, iron deficiency anemia, stage IIIb colon cancer diagnosed this year, s/p right hemicolectomy currently on adjuvant chemotherapy with 5-FU. Patient advised by outpatient provider to present to hospital for evaluation of weakness associated with ongoing nausea vomiting and liquid diarrhea.  Patient admitted with intractable nausea and vomiting.   Met with patient at bedside this morning. She reports feeling much better and endorses 100% of [place] Toast for breakfast. Patient stated that she completed her 5th chemotherapy treatment approximately 2 weeks ago and has "never experienced being so sick" before, endorses very little po intake over the past week (sips of Gatorade, ginger ale and mostly crackers). Patient reports that she does not wish to continue with future chemotherapy treatments and states "its just not worth it" RD encouraged small frequent meals and educated on consuming cold/bland foods. RD provided patient with "Sick Day" and "Tips for Nausea and  Vomiting" handouts form [employer].  Patient meets criteria for severe malnutrition in the context of chronic disease given severe wt losses and mod/severe fat and muscle wasting noted on exam. Patient reports that she does not care for Boost/Ensure nutrition supplements, states they are too sweet. Patient amenable to trying Boost Breeze supplement. Education has been provided and RD encouraging of po intake of meals and supplements.   Current wt 54.4 kg (119.7 lb) Patient recalls UBW of 140-145 lb prior to diagnosis. Per review of history weights have been trending down over the past 5 months, noting 19.8 lb (14.2%) wt loss since July which is severe for time frame. On 7/23 pt wt 63.4 kg (139.5 lb), on 8/31 pt wt 58.1 kg (127.8 lb), 9/16 pt wt 59 kg (129.8 lb), 11/18 pt wt 59.6 kg (131.1 lb)  NUTRITION - FOCUSED PHYSICAL EXAM: 12/4 Findings: moderate fat depletions to orbital and upper arm regions, severe fat depletions to buccal region; moderate muscle depletion to temple and posterior calf region, severe muscle depletion to clavicle bone, clavicle and acromion bone, dorsal hand regions   Diet Order:   Diet Order            DIET SOFT Room service appropriate? Yes; Fluid consistency: Thin  Diet effective now              EDUCATION NEEDS:   Education needs have been addressed  Skin:  Skin Assessment: Reviewed RN Assessment  Last BM:  12/3 (type 7; small)  Height:   Ht Readings from Last 1 Encounters:  03/11/19 5' 7"  (1.702 m)    Weight:   Wt Readings from Last 1 Encounters:  03/11/19 54.4  kg    Ideal Body Weight:  61.4 kg  BMI:  Body mass index is 18.79 kg/m.  Estimated Nutritional Needs:   Kcal:  1900-2100  Protein:  95-105  Fluid:  >/= 1.9 L/day   Lajuan Lines, RD, LDN Clinical Nutrition Office [phone] After Hours/Weekend Pager: [phone]

## 2019-03-13 NOTE — Discharge Summary (Signed)
Physician Discharge Summary  Mary Beltran NGE:[id number] DOB: [birth year] DOA: 03/11/2019  PCP: [name], MD  Admit date: 03/11/2019 Discharge date: 03/13/2019  Admitted From: Home Disposition: Home  Recommendations for Outpatient Follow-up:  1. Follow up with PCP on Monday  2.   Please obtain BMP on Monday, check potassium 3.   Please repeat CT abdomen pelvis in 4 to 6 weeks to reevaluate ileitis 4.   Follow-up in GI clinic in 4 weeks  Home Health: No Equipment/Devices: None  Discharge Condition: Stable CODE STATUS: DNR Diet recommendation: Regular  Brief/Interim Summary:  78 y.o.femalewith medical history significant ofcolon cancer s/p hemicolectomy and started on chemotherapy. Presented to the ED with intractable vomiting, diarrhea, abdominal pain, and inability to keep down any PO intake. CT scan abdomen/pelvis on 12/1 showed distal ileitis but no other acute findings.  In the ED, tachycardic intermittently, borderline BP's 98/33, 105/49, afebrile. Labs notable for Na 131, K 3.1, CO2 17, Mg 1.6, WBC 11.1k, troponin negative. IV fluids were started with potassium repletion. Patient admitted for IV hydration, antiemetics and GI consult.  Had started empiric Cipro and Flagyl on admission, given finding of ileitis and concern for infection, however patient developed hives and itching, so stopped antibiotics and clinically she continued to recover.  Benadryl given for hives and itching which helped.  Vomiting resolved and patient was tolerating diet this morning.  GI recommended loperamide for diarrhea which patient reported helped, still with some diarrhea although improved.  Patient had significant hypokalemia and required aggressive repletion of potassium during hospital stay.  She was able to arrange PCP appointment for Monday to get repeat labs and follow-up.  She is clinically improved and hemodynamically stable for discharge home today.  Discharge Diagnoses: Principal  Problem:   Intractable nausea and vomiting Active Problems:   Hypokalemia   Hypomagnesemia   Diarrhea   Ileitis, terminal (HCC)   Cancer of right colon (HCC)   Generalized weakness   Protein-calorie malnutrition, severe  Intractable Nausea and Vomiting Diarrhea Distal Ileitis, on CT abdomen/pelvis 12/1 Suspect chemotherapy driving all of this, however, could be infectious as well. ED workup on 12/1 - C diff negative. CT abdomen/pelvis showed inflammatory or infectious mid/distal ileitis, no suspicious findings for tumor recurrence, no metastatic lesions in liver or lung bases or adenopathy. Per patient's son, was told patient's chemo medication is associated with ileitis(?). - GI pathogen panel - all negative - Zofran PRN  - IV hydration - stop Cipro/Flagyl given hives, monitor clinically - GI consult - pending -Protonix 40 mg IV BID for now  Hypokalemia - repleted  Hypomagnesemia - repleted - monitor electrolytes - maintain K>4, Mg>2  Colon Cancer, s/p hemicolectomy on chemotherapy - patient declining further chemo - follow with Dr. Tasia Catchings   Discharge Instructions   Discharge Instructions    Call MD for:  severe uncontrolled pain   Complete by: As directed    Call MD for:  temperature >100.4   Complete by: As directed    Diet - low sodium heart healthy   Complete by: As directed    Discharge instructions   Complete by: As directed    Please see primary care doctor to repeat labs early next week, Monday.  I did send some potassium to your pharmacy, the packets to mix with juice.  You may use Immodium to help with diarrhea as needed.  Use benadryl over the counter (1 tablet, 25 mg) as needed until itching from hives has resolved.   Increase activity  slowly   Complete by: As directed      Allergies as of 03/13/2019      Reactions   Pain & Fever [acetaminophen]    Patient states all pain medications make her "vomit"   Sulfa Antibiotics Nausea And Vomiting    Ciprofloxacin Hives, Itching   Codeine Nausea And Vomiting   Penicillins Other (See Comments)   Did it involve swelling of the face/tongue/throat, SOB, or low BP? Yes Did it involve sudden or severe rash/hives, skin peeling, or any reaction on the inside of your mouth or nose? Yes Did you need to seek medical attention at a hospital or doctor's office? Yes When did it last happen?in her 78s If all above answers are "NO", may proceed with cephalosporin use.      Medication List    STOP taking these medications   lidocaine-prilocaine cream Commonly known as: EMLA   ondansetron 8 MG tablet Commonly known as: Zofran   potassium chloride SA 20 MEQ tablet Commonly known as: KLOR-CON   prochlorperazine 10 MG tablet Commonly known as: COMPAZINE     TAKE these medications   diphenhydrAMINE 25 mg capsule Commonly known as: BENADRYL Take 1 capsule (25 mg total) by mouth every 8 (eight) hours as needed for itching.   loperamide 2 MG capsule Commonly known as: IMODIUM Take 1 capsule (2 mg total) by mouth as needed for diarrhea or loose stools.   multivitamin with minerals Tabs tablet Take 1 tablet by mouth daily. Start taking on: March 14, 2019   potassium chloride 20 MEQ packet Commonly known as: KLOR-CON Take 20 mEq by mouth 2 (two) times daily for 3 days.       Allergies  Allergen Reactions  . Pain & Fever [Acetaminophen]     Patient states all pain medications make her "vomit"  . Sulfa Antibiotics Nausea And Vomiting  . Ciprofloxacin Hives and Itching  . Codeine Nausea And Vomiting  . Penicillins Other (See Comments)    Did it involve swelling of the face/tongue/throat, SOB, or low BP? Yes Did it involve sudden or severe rash/hives, skin peeling, or any reaction on the inside of your mouth or nose? Yes Did you need to seek medical attention at a hospital or doctor's office? Yes When did it last happen?in her 78s If all above answers are "NO", may proceed  with cephalosporin use.     Consultations:  Gastroenterology   Procedures/Studies: Ct Abdomen Pelvis W Contrast  Result Date: 03/10/2019 CLINICAL DATA:  Abdominal pain, diarrhea and vomiting for 2-3 days. History of colon cancer and status post partial right hemicolectomy. EXAM: CT ABDOMEN AND PELVIS WITH CONTRAST TECHNIQUE: Multidetector CT imaging of the abdomen and pelvis was performed using the standard protocol following bolus administration of intravenous contrast. CONTRAST:  6m OMNIPAQUE IOHEXOL 300 MG/ML  SOLN COMPARISON:  CT scan and 10/09/2018 FINDINGS: Lower chest: The lung bases are clear of acute process. No pleural effusion or pulmonary lesions. The heart is normal in size. No pericardial effusion. The distal esophagus and aorta are unremarkable. Hepatobiliary: No focal hepatic lesions to suggest hepatic metastatic disease. The portal and hepatic veins are patent. The gallbladder surgically absent. No common bile duct dilatation. Pancreas: No mass, inflammation or ductal dilatation. Spleen: Normal size.  No focal lesions. Adrenals/Urinary Tract: Stable nodule involving the medial limb of the left adrenal gland, unchanged since 2017 and consistent with a benign adenoma. No renal lesions or hydronephrosis.  The bladder appears normal. Stomach/Bowel: The stomach and duodenum  are unremarkable. The proximal jejunal loops of small bowel appear normal. The mid distal ileal loops of small bowel are abnormal. There is wall thickening and submucosal edema along with serosal and mucosal enhancement consistent with an inflammatory or infectious ileitis. This extends all the way to the ileal colonic anastomosis. No inflammatory changes involving the colon are identified but there is a moderate amount of fluid throughout the colon typical with diarrhea. Vascular/Lymphatic: Advanced atherosclerotic calcifications involving the abdominal aorta and iliac arteries and branch vessels. No focal aneurysm or  dissection. The major venous structures are patent. No mesenteric or retroperitoneal mass or adenopathy. Reproductive: Surgically absent. Other: No pelvic mass or pelvic lymphadenopathy. No free pelvic fluid collections. No inguinal mass or adenopathy. Musculoskeletal: No significant bony findings. IMPRESSION: 1. CT findings consistent with an inflammatory or infectious mid distal ileitis. No inflammatory changes involving the colon but there is a moderate amount of fluid throughout the colon typical with diarrhea. 2. Status post partial right hemicolectomy. No findings suspicious for recurrent tumor. 3. No findings for metastatic disease involving liver or lung bases. No mesenteric or retroperitoneal lymphadenopathy. Electronically Signed   By: Marijo Sanes M.D.   On: 03/10/2019 11:27    Subjective: Patient seen and examined, up in chair.  States she was able to tolerate meals today.  Diarrhea is improving.  Nausea vomiting is resolved.  Reports she was able to make PCP appointment for Monday.    Discharge Exam: Vitals:   03/12/19 2317 03/13/19 1417  BP: (!) 103/52 (!) 109/50  Pulse: 97 86  Resp: 16   Temp: 98.7 F (37.1 C) 98.3 F (36.8 C)  SpO2: 96% 100%   Vitals:   03/12/19 0418 03/12/19 0801 03/12/19 2317 03/13/19 1417  BP: (!) 112/47 (!) 96/51 (!) 103/52 (!) 109/50  Pulse: 100 91 97 86  Resp: 18 18 16    Temp: 98.6 F (37 C) 98 F (36.7 C) 98.7 F (37.1 C) 98.3 F (36.8 C)  TempSrc: Oral Oral Oral Oral  SpO2: 95% 95% 96% 100%  Weight:      Height:        General: Pt is alert, awake, not in acute distress Cardiovascular: RRR, S1/S2 +, no rubs, no gallops Respiratory: CTA bilaterally, no wheezing, no rhonchi Abdominal: Soft, NT, ND, bowel sounds + Extremities: no edema, no cyanosis    The results of significant diagnostics from this hospitalization (including imaging, microbiology, ancillary and laboratory) are listed below for reference.     Microbiology: Recent  Results (from the past 240 hour(s))  C difficile quick screen w PCR reflex     Status: None   Collection Time: 03/09/19  4:30 PM   Specimen: STOOL  Result Value Ref Range Status   C Diff antigen NEGATIVE NEGATIVE Final   C Diff toxin NEGATIVE NEGATIVE Final   C Diff interpretation No C. difficile detected.  Final    Comment: Performed at [facility], [address] [place], [place] [address]  GI pathogen panel by PCR, stool     Status: None   Collection Time: 03/09/19  4:30 PM   Specimen: Stool  Result Value Ref Range Status   Plesiomonas shigelloides NOT DETECTED NOT DETECTED Final   Yersinia enterocolitica NOT DETECTED NOT DETECTED Final   Vibrio NOT DETECTED NOT DETECTED Final   Enteropathogenic E coli NOT DETECTED NOT DETECTED Final   E coli (ETEC) LT/ST NOT DETECTED NOT DETECTED Final   E coli 7414 by PCR Not applicable NOT  DETECTED Final   Cryptosporidium by PCR NOT DETECTED NOT DETECTED Final   Entamoeba histolytica NOT DETECTED NOT DETECTED Final   Adenovirus F 40/41 NOT DETECTED NOT DETECTED Final   Norovirus GI/GII NOT DETECTED NOT DETECTED Final   Sapovirus NOT DETECTED NOT DETECTED Final    Comment: (NOTE) Performed At: [facility] [address] [place], [place] [id number] Rush Farmer MD VO:[id number]    Vibrio cholerae NOT DETECTED NOT DETECTED Final   Campylobacter by PCR NOT DETECTED NOT DETECTED Final   Salmonella by PCR NOT DETECTED NOT DETECTED Final   E coli (STEC) NOT DETECTED NOT DETECTED Final   Enteroaggregative E coli NOT DETECTED NOT DETECTED Final   Shigella by PCR NOT DETECTED NOT DETECTED Final   Cyclospora cayetanensis NOT DETECTED NOT DETECTED Final   Astrovirus NOT DETECTED NOT DETECTED Final   G lamblia by PCR NOT DETECTED NOT DETECTED Final   Rotavirus A by PCR NOT DETECTED NOT DETECTED Final  Culture, blood (Routine x 2)     Status: None (Preliminary result)   Collection Time: 03/11/19  2:15 PM   Specimen: BLOOD   Result Value Ref Range Status   Specimen Description BLOOD BLOOD RIGHT FOREARM  Final   Special Requests   Final    BOTTLES DRAWN AEROBIC AND ANAEROBIC Blood Culture results may not be optimal due to an excessive volume of blood received in culture bottles   Culture   Final    NO GROWTH 2 DAYS Performed at [facility], [address] [place], [place] [address]    Report Status PENDING  Incomplete  Culture, blood (Routine x 2)     Status: None (Preliminary result)   Collection Time: 03/11/19  3:03 PM   Specimen: BLOOD  Result Value Ref Range Status   Specimen Description BLOOD BLOOD LEFT FOREARM  Final   Special Requests   Final    BOTTLES DRAWN AEROBIC AND ANAEROBIC Blood Culture adequate volume   Culture   Final    NO GROWTH 2 DAYS Performed at [facility], [address] [place], [place] [address]    Report Status PENDING  Incomplete  SARS CORONAVIRUS 2 (TAT 6-24 HRS) Nasopharyngeal Nasopharyngeal Swab     Status: None   Collection Time: 03/11/19  3:51 PM   Specimen: Nasopharyngeal Swab  Result Value Ref Range Status   SARS Coronavirus 2 NEGATIVE NEGATIVE Final    Comment: (NOTE) SARS-CoV-2 target nucleic acids are NOT DETECTED. The SARS-CoV-2 RNA is generally detectable in upper and lower respiratory specimens during the acute phase of infection. Negative results do not preclude SARS-CoV-2 infection, do not rule out co-infections with other pathogens, and should not be used as the sole basis for treatment or other patient management decisions. Negative results must be combined with clinical observations, patient history, and epidemiological information. The expected result is Negative. Fact Sheet for Patients: SugarRoll.be Fact Sheet for Healthcare Providers: https://www.woods-mathews.com/ This test is not yet approved or cleared by the [place] FDA and  has been authorized for detection and/or  diagnosis of SARS-CoV-2 by FDA under an Emergency Use Authorization (EUA). This EUA will remain  in effect (meaning this test can be used) for the duration of the COVID-19 declaration under Section 56 4(b)(1) of the Act, 21 U.S.C. section 360bbb-3(b)(1), unless the authorization is terminated or revoked sooner. Performed at [facility], [place] [address], [place], [place] [address]      Labs: BNP (last 3 results) No results  for input(s): BNP in the last 8760 hours. Basic Metabolic Panel: Recent Labs  Lab 03/09/19 1500 03/10/19 1015 03/11/19 1415 03/12/19 0416 03/13/19 0524 03/13/19 1535  NA 130* 134* 131* 133* 133* 132*  K 2.8* 3.8 3.1* 3.0* 3.0* 4.4  CL 103 105 104 107 107 107  CO2 19* 18* 17* 18* 22 20*  GLUCOSE 122* 133* 105* 101* 107* 116*  BUN 14 10 15 13  7* 6*  CREATININE 0.99 0.78 0.71 0.64 0.56 0.54  CALCIUM 8.6* 9.6 9.1 7.8* 7.4* 7.5*  MG 1.6*  --  1.6* 2.1 1.8  --    Liver Function Tests: Recent Labs  Lab 03/09/19 1500 03/10/19 1015 03/11/19 1415 03/12/19 0416  AST 16 21 20  13*  ALT 14 15 16 13   ALKPHOS 71 67 65 56  BILITOT 0.7 0.7 0.7 1.0  PROT 6.4* 6.8 5.9* 4.8*  ALBUMIN 3.6 3.7 3.0* 2.4*   Recent Labs  Lab 03/10/19 1015  LIPASE 23   No results for input(s): AMMONIA in the last 168 hours. CBC: Recent Labs  Lab 03/09/19 1500 03/10/19 1015 03/11/19 1415 03/12/19 0416 03/13/19 0524  WBC 8.4 5.6 11.1* 8.3 8.4  NEUTROABS 5.7  --  7.2  --   --   HGB 12.5 12.6 11.8* 10.7* 9.9*  HCT 37.9 36.9 33.6* 31.2* 29.4*  MCV 94.8 92.3 88.4 92.0 91.6  PLT 181 179 164 144* 148*   Cardiac Enzymes: No results for input(s): CKTOTAL, CKMB, CKMBINDEX, TROPONINI in the last 168 hours. BNP: Invalid input(s): POCBNP CBG: No results for input(s): GLUCAP in the last 168 hours. D-Dimer No results for input(s): DDIMER in the last 72 hours. Hgb A1c No results for input(s): HGBA1C in the last 72 hours. Lipid Profile No results for input(s): CHOL, HDL,  LDLCALC, TRIG, CHOLHDL, LDLDIRECT in the last 72 hours. Thyroid function studies No results for input(s): TSH, T4TOTAL, T3FREE, THYROIDAB in the last 72 hours.  Invalid input(s): FREET3 Anemia work up No results for input(s): VITAMINB12, FOLATE, FERRITIN, TIBC, IRON, RETICCTPCT in the last 72 hours. Urinalysis    Component Value Date/Time   COLORURINE STRAW (A) 04/23/2015 1711   APPEARANCEUR CLEAR (A) 04/23/2015 1711   LABSPEC 1.005 04/23/2015 1711   PHURINE 7.0 04/23/2015 1711   GLUCOSEU NEGATIVE 04/23/2015 1711   HGBUR 1+ (A) 04/23/2015 1711   BILIRUBINUR NEGATIVE 04/23/2015 1711   KETONESUR NEGATIVE 04/23/2015 1711   PROTEINUR NEGATIVE 04/23/2015 1711   NITRITE NEGATIVE 04/23/2015 1711   LEUKOCYTESUR NEGATIVE 04/23/2015 1711   Sepsis Labs Invalid input(s): PROCALCITONIN,  WBC,  LACTICIDVEN Microbiology Recent Results (from the past 240 hour(s))  C difficile quick screen w PCR reflex     Status: None   Collection Time: 03/09/19  4:30 PM   Specimen: STOOL  Result Value Ref Range Status   C Diff antigen NEGATIVE NEGATIVE Final   C Diff toxin NEGATIVE NEGATIVE Final   C Diff interpretation No C. difficile detected.  Final    Comment: Performed at [facility], [address] [place], [place] [address]  GI pathogen panel by PCR, stool     Status: None   Collection Time: 03/09/19  4:30 PM   Specimen: Stool  Result Value Ref Range Status   Plesiomonas shigelloides NOT DETECTED NOT DETECTED Final   Yersinia enterocolitica NOT DETECTED NOT DETECTED Final   Vibrio NOT DETECTED NOT DETECTED Final   Enteropathogenic E coli NOT DETECTED NOT DETECTED Final   E coli (ETEC) LT/ST NOT DETECTED NOT DETECTED  Final   E coli 1660 by PCR Not applicable NOT DETECTED Final   Cryptosporidium by PCR NOT DETECTED NOT DETECTED Final   Entamoeba histolytica NOT DETECTED NOT DETECTED Final   Adenovirus F 40/41 NOT DETECTED NOT DETECTED Final   Norovirus GI/GII NOT DETECTED NOT DETECTED  Final   Sapovirus NOT DETECTED NOT DETECTED Final    Comment: (NOTE) Performed At: [facility] [address] [place], [place] [id number] Rush Farmer MD NA:[id number]    Vibrio cholerae NOT DETECTED NOT DETECTED Final   Campylobacter by PCR NOT DETECTED NOT DETECTED Final   Salmonella by PCR NOT DETECTED NOT DETECTED Final   E coli (STEC) NOT DETECTED NOT DETECTED Final   Enteroaggregative E coli NOT DETECTED NOT DETECTED Final   Shigella by PCR NOT DETECTED NOT DETECTED Final   Cyclospora cayetanensis NOT DETECTED NOT DETECTED Final   Astrovirus NOT DETECTED NOT DETECTED Final   G lamblia by PCR NOT DETECTED NOT DETECTED Final   Rotavirus A by PCR NOT DETECTED NOT DETECTED Final  Culture, blood (Routine x 2)     Status: None (Preliminary result)   Collection Time: 03/11/19  2:15 PM   Specimen: BLOOD  Result Value Ref Range Status   Specimen Description BLOOD BLOOD RIGHT FOREARM  Final   Special Requests   Final    BOTTLES DRAWN AEROBIC AND ANAEROBIC Blood Culture results may not be optimal due to an excessive volume of blood received in culture bottles   Culture   Final    NO GROWTH 2 DAYS Performed at [facility], [address] [place], [place] [address]    Report Status PENDING  Incomplete  Culture, blood (Routine x 2)     Status: None (Preliminary result)   Collection Time: 03/11/19  3:03 PM   Specimen: BLOOD  Result Value Ref Range Status   Specimen Description BLOOD BLOOD LEFT FOREARM  Final   Special Requests   Final    BOTTLES DRAWN AEROBIC AND ANAEROBIC Blood Culture adequate volume   Culture   Final    NO GROWTH 2 DAYS Performed at [facility], [address] [place], [place] [address]    Report Status PENDING  Incomplete  SARS CORONAVIRUS 2 (TAT 6-24 HRS) Nasopharyngeal Nasopharyngeal Swab     Status: None   Collection Time: 03/11/19  3:51 PM   Specimen: Nasopharyngeal Swab  Result Value Ref Range Status   SARS Coronavirus 2  NEGATIVE NEGATIVE Final    Comment: (NOTE) SARS-CoV-2 target nucleic acids are NOT DETECTED. The SARS-CoV-2 RNA is generally detectable in upper and lower respiratory specimens during the acute phase of infection. Negative results do not preclude SARS-CoV-2 infection, do not rule out co-infections with other pathogens, and should not be used as the sole basis for treatment or other patient management decisions. Negative results must be combined with clinical observations, patient history, and epidemiological information. The expected result is Negative. Fact Sheet for Patients: SugarRoll.be Fact Sheet for Healthcare Providers: https://www.woods-mathews.com/ This test is not yet approved or cleared by the [place] FDA and  has been authorized for detection and/or diagnosis of SARS-CoV-2 by FDA under an Emergency Use Authorization (EUA). This EUA will remain  in effect (meaning this test can be used) for the duration of the COVID-19 declaration under Section 56 4(b)(1) of the Act, 21 U.S.C. section 360bbb-3(b)(1), unless the authorization is terminated or revoked sooner. Performed at [facility], [place] [address] [place], [place] [address]  Time coordinating discharge: Over 30 minutes  SIGNED:   Ezekiel Slocumb, DO [facility] 03/13/2019, 5:10 PM Pager [phone]  If 7PM-7AM, please contact night-coverage [email or web address] Password [email or web address]

## 2019-03-13 NOTE — Progress Notes (Addendum)
Hematology/Oncology Progress Note [facility] [phone] [phone]  Patient Care Team: [name], MD as PCP - General (Internal Medicine) Clent Jacks, RN as Oncology Nurse Navigator   Name of the patient: Mary Beltran  [id number]  [birth year]  Date of visit: 03/13/19  INTERVAL HISTORY-  Patient reports feeling better today.  Abdominal discomfort has improved.  Still have some watery diarrhea.  No nausea today. Blood pressure today is 103/52, improved.  She is sitting in the bed and eating lunch. Antibiotics were discontinued due to possible allergic reactions.  Patient was also seen by gastroenterology. Son is not at bedside.  Review of systems- Review of Systems  Constitutional: Positive for fatigue. Negative for chills and fever.  HENT:   Negative for hearing loss and voice change.   Eyes: Negative for eye problems.  Respiratory: Negative for chest tightness and cough.   Cardiovascular: Negative for chest pain.  Gastrointestinal: Positive for diarrhea. Negative for abdominal distention, abdominal pain, blood in stool and nausea.  Endocrine: Negative for hot flashes.  Genitourinary: Negative for difficulty urinating and frequency.   Musculoskeletal: Negative for arthralgias.  Skin: Negative for itching and rash.  Neurological: Negative for extremity weakness.  Hematological: Negative for adenopathy.  Psychiatric/Behavioral: Negative for confusion.    Allergies  Allergen Reactions   Pain & Fever [Acetaminophen]     Patient states all pain medications make her "vomit"   Sulfa Antibiotics Nausea And Vomiting   Ciprofloxacin Hives and Itching   Codeine Nausea And Vomiting   Penicillins Other (See Comments)    Did it involve swelling of the face/tongue/throat, SOB, or low BP? Yes Did it involve sudden or severe rash/hives, skin peeling, or any reaction on the inside of your mouth or nose? Yes Did you need to  seek medical attention at a hospital or doctor's office? Yes When did it last happen?in her 57s If all above answers are NO, may proceed with cephalosporin use.     Patient Active Problem List   Diagnosis Date Noted   Generalized weakness    Intractable nausea and vomiting 03/11/2019   Hypokalemia 03/11/2019   Hypomagnesemia 03/11/2019   Diarrhea 03/11/2019   Ileitis, terminal ([place]) 03/11/2019   Nausea without vomiting 01/28/2019   Goals of care, counseling/discussion 12/17/2018   Family history of cancer 12/17/2018   Cancer of right colon ([place]) 10/23/2018   IDA (iron deficiency anemia) 10/13/2018   Cramps, muscle, general 04/23/2017   Healthcare maintenance 04/23/2017   History of nonmelanoma skin cancer 01/01/2017   Incidental pulmonary nodule 05/10/2015   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     Past Medical History:  Diagnosis Date   Cancer ([place])    Complication of anesthesia    NOT WITH GB   GERD (gastroesophageal reflux disease)    IDA (iron deficiency anemia) 10/13/2018   PONV (postoperative nausea and vomiting)      Past Surgical History:  Procedure Laterality Date   ABDOMINAL HYSTERECTOMY     complete   APPENDECTOMY     CHOLECYSTECTOMY N/A 08/22/2018   Procedure: LAPAROSCOPIC CHOLECYSTECTOMY;  Surgeon: Herbert Pun, MD;  Location: [facility] ORS;  Service: General;  Laterality: N/A;   COLONOSCOPY     LAPAROSCOPIC RIGHT COLECTOMY N/A 10/23/2018   Procedure: LAPAROSCOPIC HAND ASSISTED RIGHT COLECTOMY;  Surgeon: Herbert Pun, MD;  Location: [facility] ORS;  Service: General;  Laterality: N/A;   PERCUTANEOUS PINNING WRIST FRACTURE Right    PORTACATH PLACEMENT Right 12/08/2018   Procedure: INSERTION PORT-A-CATH, CENTRAL  VENOUS CATH WITH SUBCUTANEOUS INFUSION PORT;  Surgeon: Herbert Pun, MD;  Location: [facility] ORS;  Service: General;  Laterality: Right;   WRIST FOREIGN BODY REMOVAL Right    2016    Social History    Socioeconomic History   Marital status: Divorced    Spouse name: Not on file   Number of children: Not on file   Years of education: Not on file   Highest education level: Not on file  Occupational History   Occupation: retired  Scientist, product/process development strain: Not on file   Food insecurity    Worry: Not on file    Inability: Not on Lexicographer needs    Medical: Not on file    Non-medical: Not on file  Tobacco Use   Smoking status: Current Every Day Smoker    Packs/day: 0.25    Years: 60.00    Pack years: 15.00    Types: Cigarettes   Smokeless tobacco: Never Used   Tobacco comment: Patient states that this is the only thing she enjoys  Substance and Sexual Activity   Alcohol use: No   Drug use: Not Currently   Sexual activity: Not Currently  Lifestyle   Physical activity    Days per week: Not on file    Minutes per session: Not on file   Stress: Not on file  Relationships   Social connections    Talks on phone: Not on file    Gets together: Not on file    Attends religious service: Not on file    Active member of club or organization: Not on file    Attends meetings of clubs or organizations: Not on file    Relationship status: Not on file   Intimate partner violence    Fear of current or ex partner: Not on file    Emotionally abused: Not on file    Physically abused: Not on file    Forced sexual activity: Not on file  Other Topics Concern   Not on file  Social History Narrative   Not on file     Family History  Problem Relation Age of Onset   COPD Mother    Suicidality Father    Breast cancer Paternal Grandmother    Failure to thrive Sister      Current Facility-Administered Medications:    bisacodyl (DULCOLAX) EC tablet 5 mg, 5 mg, Oral, Daily PRN, Nicole Kindred A, DO   Chlorhexidine Gluconate Cloth 2 % PADS 6 each, 6 each, Topical, Q0600, Nicole Kindred A, DO   diphenhydrAMINE (BENADRYL) capsule 25  mg, 25 mg, Oral, Q8H PRN, Nicole Kindred A, DO, 25 mg at 03/13/19 0216   enoxaparin (LOVENOX) injection 40 mg, 40 mg, Subcutaneous, Q24H, Nicole Kindred A, DO, 40 mg at 03/12/19 2216   feeding supplement (BOOST / RESOURCE BREEZE) liquid 1 Container, 1 Container, Oral, TID BM, Nicole Kindred A, DO   fentaNYL (SUBLIMAZE) injection 50 mcg, 50 mcg, Intravenous, Q30 min PRN, Lilia Pro., MD, 50 mcg at 03/11/19 1424   lactated ringers 1,000 mL with potassium chloride 20 mEq infusion, , Intravenous, Continuous, Charlett Nose, RPH, Last Rate: 100 mL/hr at 03/13/19 1013   loperamide (IMODIUM) capsule 2 mg, 2 mg, Oral, PRN, Nicole Kindred A, DO   magnesium citrate solution 1 Bottle, 1 Bottle, Oral, Once PRN, Nicole Kindred A, DO   multivitamin with minerals tablet 1 tablet, 1 tablet, Oral, Daily, Nicole Kindred A, DO   ondansetron (  ZOFRAN) tablet 4 mg, 4 mg, Oral, Q6H PRN **OR** ondansetron (ZOFRAN) injection 4 mg, 4 mg, Intravenous, Q6H PRN, Nicole Kindred A, DO   pantoprazole (PROTONIX) injection 40 mg, 40 mg, Intravenous, Q12H, Nicole Kindred A, DO, 40 mg at 03/13/19 3532   polyethylene glycol (MIRALAX / GLYCOLAX) packet 17 g, 17 g, Oral, Daily PRN, Nicole Kindred A, DO   potassium chloride 10 mEq in 100 mL IVPB, 10 mEq, Intravenous, Q1 Hr x 3, Nicole Kindred A, DO, Last Rate: 100 mL/hr at 03/13/19 1233, 10 mEq at 03/13/19 1233   sodium chloride flush (NS) 0.9 % injection 10 mL, 10 mL, Intravenous, Q12H, Nicole Kindred A, DO   traZODone (DESYREL) tablet 25 mg, 25 mg, Oral, QHS PRN, Ezekiel Slocumb, DO   Physical exam:  Vitals:   03/12/19 0200 03/12/19 0418 03/12/19 0801 03/12/19 2317  BP: (!) 97/51 (!) 112/47 (!) 96/51 (!) 103/52  Pulse: (!) 101 100 91 97  Resp: (!) 22 18 18 16   Temp:  98.6 F (37 C) 98 F (36.7 C) 98.7 F (37.1 C)  TempSrc:  Oral Oral Oral  SpO2: 96% 95% 95% 96%  Weight:      Height:       Physical Exam  Constitutional: She is oriented to  person, place, and time. No distress.  HENT:  Head: Normocephalic and atraumatic.  Nose: Nose normal.  Mouth/Throat: Oropharynx is clear and moist. No oropharyngeal exudate.  Eyes: Pupils are equal, round, and reactive to light. EOM are normal. No scleral icterus.  Neck: Normal range of motion. Neck supple.  Cardiovascular: Normal rate and regular rhythm.  No murmur heard. Pulmonary/Chest: Effort normal. No respiratory distress. She has no rales. She exhibits no tenderness.  Abdominal: Soft. She exhibits no distension. There is no abdominal tenderness.  Musculoskeletal: Normal range of motion.        General: No edema.  Neurological: She is alert and oriented to person, place, and time. No cranial nerve deficit. She exhibits normal muscle tone. Coordination normal.  Skin: Skin is warm and dry. She is not diaphoretic. No erythema.  Psychiatric: Affect normal.       CMP Latest Ref Rng & Units 03/13/2019  Glucose 70 - 99 mg/dL 107(H)  BUN 8 - 23 mg/dL 7(L)  Creatinine 0.44 - 1.00 mg/dL 0.56  Sodium 135 - 145 mmol/L 133(L)  Potassium 3.5 - 5.1 mmol/L 3.0(L)  Chloride 98 - 111 mmol/L 107  CO2 22 - 32 mmol/L 22  Calcium 8.9 - 10.3 mg/dL 7.4(L)  Total Protein 6.5 - 8.1 g/dL -  Total Bilirubin 0.3 - 1.2 mg/dL -  Alkaline Phos 38 - 126 U/L -  AST 15 - 41 U/L -  ALT 0 - 44 U/L -   CBC Latest Ref Rng & Units 03/13/2019  WBC 4.0 - 10.5 K/uL 8.4  Hemoglobin 12.0 - 15.0 g/dL 9.9(L)  Hematocrit 36.0 - 46.0 % 29.4(L)  Platelets 150 - 400 K/uL 148(L)   RADIOGRAPHIC STUDIES: I have personally reviewed the radiological images as listed and agreed with the findings in the report.  Ct Abdomen Pelvis W Contrast  Result Date: 03/10/2019 CLINICAL DATA:  Abdominal pain, diarrhea and vomiting for 2-3 days. History of colon cancer and status post partial right hemicolectomy. EXAM: CT ABDOMEN AND PELVIS WITH CONTRAST TECHNIQUE: Multidetector CT imaging of the abdomen and pelvis was performed using  the standard protocol following bolus administration of intravenous contrast. CONTRAST:  27m OMNIPAQUE IOHEXOL 300 MG/ML  SOLN COMPARISON:  CT scan and 10/09/2018 FINDINGS: Lower chest: The lung bases are clear of acute process. No pleural effusion or pulmonary lesions. The heart is normal in size. No pericardial effusion. The distal esophagus and aorta are unremarkable. Hepatobiliary: No focal hepatic lesions to suggest hepatic metastatic disease. The portal and hepatic veins are patent. The gallbladder surgically absent. No common bile duct dilatation. Pancreas: No mass, inflammation or ductal dilatation. Spleen: Normal size.  No focal lesions. Adrenals/Urinary Tract: Stable nodule involving the medial limb of the left adrenal gland, unchanged since 2017 and consistent with a benign adenoma. No renal lesions or hydronephrosis.  The bladder appears normal. Stomach/Bowel: The stomach and duodenum are unremarkable. The proximal jejunal loops of small bowel appear normal. The mid distal ileal loops of small bowel are abnormal. There is wall thickening and submucosal edema along with serosal and mucosal enhancement consistent with an inflammatory or infectious ileitis. This extends all the way to the ileal colonic anastomosis. No inflammatory changes involving the colon are identified but there is a moderate amount of fluid throughout the colon typical with diarrhea. Vascular/Lymphatic: Advanced atherosclerotic calcifications involving the abdominal aorta and iliac arteries and branch vessels. No focal aneurysm or dissection. The major venous structures are patent. No mesenteric or retroperitoneal mass or adenopathy. Reproductive: Surgically absent. Other: No pelvic mass or pelvic lymphadenopathy. No free pelvic fluid collections. No inguinal mass or adenopathy. Musculoskeletal: No significant bony findings. IMPRESSION: 1. CT findings consistent with an inflammatory or infectious mid distal ileitis. No inflammatory  changes involving the colon but there is a moderate amount of fluid throughout the colon typical with diarrhea. 2. Status post partial right hemicolectomy. No findings suspicious for recurrent tumor. 3. No findings for metastatic disease involving liver or lung bases. No mesenteric or retroperitoneal lymphadenopathy. Electronically Signed   By: Marijo Sanes M.D.   On: 03/10/2019 11:27    Assessment and plan-   #Nausea vomiting diarrhea #History of stage IIIb colon cancer, previously on adjuvant chemotherapy with 5-FU. Infectious etiology versus chemotherapy side effects.  Clinically patient is doing well since admission.  She is on supportive care with IV fluid, antiemetics, antidiarrhea medication, electrolyte replacement. Her blood pressure has also improved Patient clinically feels better, able to keep food down. She has discussed with Dr. Melrose Nakayama with potential discharge plan this afternoon if her repeat blood work showed improved potassium level.  Patient will go home with oral potassium supplementation and follow-up with primary care provider early next week with repeat blood work and further management. Encourage patient to discuss with her son regarding her future follow-up plan.   Patient has decided not to proceed with any additional chemotherapy.  No plan of chemo at this point. Prior to current admission, patient and her son had expressed their unsatisfaction about her care at the cancer center. I have had lengthy discussions with patient as well as patient's son and have tried my best to explain and answer their questions.Cancer center director will approach her after her discharge for further discussion.  Thank you for allowing me to participate in the care of this patient.   [name], MD, PhD Hematology Oncology [facility] at [facility] Pager- [phone] 03/13/2019

## 2019-03-13 NOTE — Progress Notes (Signed)
Mary Antigua, MD [address], [place], [place], [place], [address] [address], [place], [place], [place], [address] Phone: [phone]  Fax: [phone]   Subjective: Patient continues to improve overall.  Abdominal discomfort has improved.  Still having some watery bowel movements but states is much improved  Objective: Exam: Vital signs in last 24 hours: Vitals:   03/12/19 0418 03/12/19 0801 03/12/19 2317 03/13/19 1417  BP: (!) 112/47 (!) 96/51 (!) 103/52 (!) 109/50  Pulse: 100 91 97 86  Resp: 18 18 16    Temp: 98.6 F (37 C) 98 F (36.7 C) 98.7 F (37.1 C) 98.3 F (36.8 C)  TempSrc: Oral Oral Oral Oral  SpO2: 95% 95% 96% 100%  Weight:      Height:       Weight change:   Intake/Output Summary (Last 24 hours) at 03/13/2019 1500 Last data filed at 03/13/2019 1300 Gross per 24 hour  Intake 1772.8 ml  Output -  Net 1772.8 ml    General: No acute distress, AAO x3 Abd: Soft, NT/ND, No HSM Skin: Warm, no rashes Neck: Supple, Trachea midline   Lab Results: Lab Results  Component Value Date   WBC 8.4 03/13/2019   HGB 9.9 (L) 03/13/2019   HCT 29.4 (L) 03/13/2019   MCV 91.6 03/13/2019   PLT 148 (L) 03/13/2019   Micro Results: Recent Results (from the past 240 hour(s))  C difficile quick screen w PCR reflex     Status: None   Collection Time: 03/09/19  4:30 PM   Specimen: STOOL  Result Value Ref Range Status   C Diff antigen NEGATIVE NEGATIVE Final   C Diff toxin NEGATIVE NEGATIVE Final   C Diff interpretation No C. difficile detected.  Final    Comment: Performed at [facility], [address] [place], [place] [address]  GI pathogen panel by PCR, stool     Status: None   Collection Time: 03/09/19  4:30 PM   Specimen: Stool  Result Value Ref Range Status   Plesiomonas shigelloides NOT DETECTED NOT DETECTED Final   Yersinia enterocolitica NOT DETECTED NOT DETECTED Final   Vibrio NOT DETECTED NOT DETECTED Final   Enteropathogenic E  coli NOT DETECTED NOT DETECTED Final   E coli (ETEC) LT/ST NOT DETECTED NOT DETECTED Final   E coli 7096 by PCR Not applicable NOT DETECTED Final   Cryptosporidium by PCR NOT DETECTED NOT DETECTED Final   Entamoeba histolytica NOT DETECTED NOT DETECTED Final   Adenovirus F 40/41 NOT DETECTED NOT DETECTED Final   Norovirus GI/GII NOT DETECTED NOT DETECTED Final   Sapovirus NOT DETECTED NOT DETECTED Final    Comment: (NOTE) Performed At: [facility] [place], [place] [id number] Rush Farmer MD ML:[id number]    Vibrio cholerae NOT DETECTED NOT DETECTED Final   Campylobacter by PCR NOT DETECTED NOT DETECTED Final   Salmonella by PCR NOT DETECTED NOT DETECTED Final   E coli (STEC) NOT DETECTED NOT DETECTED Final   Enteroaggregative E coli NOT DETECTED NOT DETECTED Final   Shigella by PCR NOT DETECTED NOT DETECTED Final   Cyclospora cayetanensis NOT DETECTED NOT DETECTED Final   Astrovirus NOT DETECTED NOT DETECTED Final   G lamblia by PCR NOT DETECTED NOT DETECTED Final   Rotavirus A by PCR NOT DETECTED NOT DETECTED Final  Culture, blood (Routine x 2)     Status: None (Preliminary result)   Collection Time: 03/11/19  2:15 PM   Specimen: BLOOD  Result  Value Ref Range Status   Specimen Description BLOOD BLOOD RIGHT FOREARM  Final   Special Requests   Final    BOTTLES DRAWN AEROBIC AND ANAEROBIC Blood Culture results may not be optimal due to an excessive volume of blood received in culture bottles   Culture   Final    NO GROWTH 2 DAYS Performed at [facility], [address] [place], [place] [address]    Report Status PENDING  Incomplete  Culture, blood (Routine x 2)     Status: None (Preliminary result)   Collection Time: 03/11/19  3:03 PM   Specimen: BLOOD  Result Value Ref Range Status   Specimen Description BLOOD BLOOD LEFT FOREARM  Final   Special Requests   Final    BOTTLES DRAWN AEROBIC AND ANAEROBIC Blood Culture adequate volume    Culture   Final    NO GROWTH 2 DAYS Performed at [facility], [address] [place], [place] [address]    Report Status PENDING  Incomplete  SARS CORONAVIRUS 2 (TAT 6-24 HRS) Nasopharyngeal Nasopharyngeal Swab     Status: None   Collection Time: 03/11/19  3:51 PM   Specimen: Nasopharyngeal Swab  Result Value Ref Range Status   SARS Coronavirus 2 NEGATIVE NEGATIVE Final    Comment: (NOTE) SARS-CoV-2 target nucleic acids are NOT DETECTED. The SARS-CoV-2 RNA is generally detectable in upper and lower respiratory specimens during the acute phase of infection. Negative results do not preclude SARS-CoV-2 infection, do not rule out co-infections with other pathogens, and should not be used as the sole basis for treatment or other patient management decisions. Negative results must be combined with clinical observations, patient history, and epidemiological information. The expected result is Negative. Fact Sheet for Patients: SugarRoll.be Fact Sheet for Healthcare Providers: https://www.woods-mathews.com/ This test is not yet approved or cleared by the [place] FDA and  has been authorized for detection and/or diagnosis of SARS-CoV-2 by FDA under an Emergency Use Authorization (EUA). This EUA will remain  in effect (meaning this test can be used) for the duration of the COVID-19 declaration under Section 56 4(b)(1) of the Act, 21 U.S.C. section 360bbb-3(b)(1), unless the authorization is terminated or revoked sooner. Performed at [facility], [place] [address], [place], [place] [address]    Studies/Results: No results found. Medications:  Scheduled Meds: . Chlorhexidine Gluconate Cloth  6 each Topical Q0600  . enoxaparin (LOVENOX) injection  40 mg Subcutaneous Q24H  . feeding supplement  1 Container Oral TID BM  . multivitamin with minerals  1 tablet Oral Daily  . pantoprazole (PROTONIX) IV  40 mg Intravenous Q12H  .  sodium chloride flush  10 mL Intravenous Q12H   Continuous Infusions: . lactated ringers with kcl 100 mL/hr at 03/13/19 1013  . potassium chloride 10 mEq (03/13/19 1455)   PRN Meds:.bisacodyl, diphenhydrAMINE, fentaNYL (SUBLIMAZE) injection, loperamide, magnesium citrate, ondansetron **OR** ondansetron (ZOFRAN) IV, polyethylene glycol, traZODone   Assessment: Principal Problem:   Intractable nausea and vomiting Active Problems:   Cancer of right colon (H[facility])   Hypokalemia   Hypomagnesemia   Diarrhea   Ileitis, terminal ([facility])   Generalized weakness    Plan: Diarrhea improving Continue Loperamide as needed Patient remains afebrile.  White count normal.  GI service will sign off at this time.  Please page with any questions or concerns  Follow-up with PCP closely and patient can have repeat CT scan in 4 to 6 weeks to reevaluate the areas in the small  bowel seen on the CT that appeared thickened to ensure they have normalized  Follow up in GI clinic in 4 weeks   LOS: 1 day   Mary Antigua, MD 03/13/2019, 3:00 PM

## 2019-03-13 NOTE — Evaluation (Signed)
Physical Therapy Evaluation Patient Details Name: Mary Beltran MRN: [id number] DOB: 1941-01-30 Today's Date: 03/13/2019   History of Present Illness  Mary Beltran is a 77yoF who comes to [facility] on 12/1 with intractable vomiting and diarrhea for 4 days. Pt admitted c electrolyte disturbance. PMH: Colon CA currnet c chemotherapy.  Clinical Impression  Pt in bed upon arrival, surprised to hear that PT has been called to evaluate, hitherto without any abnormality in mobility. Pt AMB >579f, 12 stairs, low-to moderate effort, denies any acute impairment. Pt reports to be at baseline. No PT services warranted at this time. PT signing off. PT recommends daily ambulation ad lib or with nursing staff as needed to prevent deconditioning.      Follow Up Recommendations No PT follow up    Equipment Recommendations  None recommended by PT    Recommendations for Other Services       Precautions / Restrictions Precautions Precautions: Fall Restrictions Weight Bearing Restrictions: No      Mobility  Bed Mobility Overal bed mobility: Needs Assistance Bed Mobility: Supine to Sit     Supine to sit: Supervision     General bed mobility comments: poor awareness of IV, cues to attend to line safety.  Transfers Overall transfer level: Modified independent Equipment used: None             General transfer comment: mod effort,no LOB  Ambulation/Gait Ambulation/Gait assistance: Modified independent (Device/Increase time)(author manages IV) Gait Distance (Feet): 525 Feet Assistive device: None Gait Pattern/deviations: WFL(Within Functional Limits)     General Gait Details: denies any acute impairment  Stairs Stairs: Yes Stairs assistance: Supervision Stair Management: Two rails Number of Stairs: 12 General stair comments: cues for safety regarding IV  Wheelchair Mobility    Modified Rankin (Stroke Patients Only)       Balance Overall balance assessment: Modified  Independent;No apparent balance deficits (not formally assessed)                                           Pertinent Vitals/Pain Pain Assessment: No/denies pain    Home Living Family/patient expects to be discharged to:: Private residence Living Arrangements: Alone   Type of Home: House(condo on one level) Home Access: Stairs to enter Entrance Stairs-Rails: Right Entrance Stairs-Number of Steps: 13 Home Layout: One level Home Equipment: None      Prior Function           Comments: Driving, groceries;     Hand Dominance        Extremity/Trunk Assessment   Upper Extremity Assessment Upper Extremity Assessment: Overall WFL for tasks assessed    Lower Extremity Assessment Lower Extremity Assessment: Overall WFL for tasks assessed    Cervical / Trunk Assessment Cervical / Trunk Assessment: Normal  Communication   Communication: No difficulties  Cognition Arousal/Alertness: Awake/alert Behavior During Therapy: WFL for tasks assessed/performed Overall Cognitive Status: Within Functional Limits for tasks assessed                                        General Comments      Exercises     Assessment/Plan    PT Assessment Patent does not need any further PT services  PT Problem List Decreased activity tolerance       PT Treatment  Interventions      PT Goals (Current goals can be found in the Care Plan section)  Acute Rehab PT Goals PT Goal Formulation: All assessment and education complete, DC therapy    Frequency     Barriers to discharge        Co-evaluation               AM-PAC PT "6 Clicks" Mobility  Outcome Measure Help needed turning from your back to your side while in a flat bed without using bedrails?: None Help needed moving from lying on your back to sitting on the side of a flat bed without using bedrails?: None Help needed moving to and from a bed to a chair (including a wheelchair)?:  None Help needed standing up from a chair using your arms (e.g., wheelchair or bedside chair)?: None Help needed to walk in hospital room?: None Help needed climbing 3-5 steps with a railing? : None 6 Click Score: 24    End of Session Equipment Utilized During Treatment: Gait belt(for gown closure) Activity Tolerance: Patient tolerated treatment well;No increased pain Patient left: in chair;with call bell/phone within reach   PT Visit Diagnosis: Unsteadiness on feet (R26.81);Muscle weakness (generalized) (M62.81)    Time: 1342-1400 PT Time Calculation (min) (ACUTE ONLY): 18 min   Charges:   PT Evaluation $PT Eval Low Complexity: 1 Low PT Treatments $Therapeutic Exercise: 8-22 mins        3:12 PM, 03/13/19 Etta Grandchild, PT, DPT Physical Therapist - [facility]  [phone] ([place])    Zion C 03/13/2019, 3:10 PM

## 2019-03-16 DIAGNOSIS — Z79899 Other long term (current) drug therapy: Secondary | ICD-10-CM | POA: Diagnosis not present

## 2019-03-16 DIAGNOSIS — C182 Malignant neoplasm of ascending colon: Secondary | ICD-10-CM | POA: Diagnosis not present

## 2019-03-16 DIAGNOSIS — R69 Illness, unspecified: Secondary | ICD-10-CM | POA: Diagnosis not present

## 2019-03-16 DIAGNOSIS — E876 Hypokalemia: Secondary | ICD-10-CM | POA: Diagnosis not present

## 2019-03-16 DIAGNOSIS — Z9049 Acquired absence of other specified parts of digestive tract: Secondary | ICD-10-CM | POA: Diagnosis not present

## 2019-03-16 DIAGNOSIS — K529 Noninfective gastroenteritis and colitis, unspecified: Secondary | ICD-10-CM | POA: Diagnosis not present

## 2019-03-16 LAB — CULTURE, BLOOD (ROUTINE X 2)
Culture: NO GROWTH
Culture: NO GROWTH
Special Requests: ADEQUATE

## 2019-03-26 ENCOUNTER — Telehealth: Payer: Self-pay

## 2019-03-26 DIAGNOSIS — C182 Malignant neoplasm of ascending colon: Secondary | ICD-10-CM

## 2019-03-26 NOTE — Telephone Encounter (Signed)
Dr. Tasia Catchings would like for patient to be evaluated by Dr. Baruch Gouty to discuss possible radiation.  Will enter referral and let Dr. Olena Leatherwood nurse team know.  Patient does not want to receive any further chemotherapy at this time and asking for her port to be removed.

## 2019-03-26 NOTE — Telephone Encounter (Signed)
Order for port removal faxed to Dr. Peyton Najjar Diaz's office @ [phone]

## 2019-04-03 ENCOUNTER — Encounter: Payer: Self-pay | Admitting: Radiation Oncology

## 2019-04-06 ENCOUNTER — Other Ambulatory Visit: Payer: Self-pay

## 2019-04-07 ENCOUNTER — Other Ambulatory Visit: Payer: Self-pay

## 2019-04-07 ENCOUNTER — Ambulatory Visit
Admission: RE | Admit: 2019-04-07 | Discharge: 2019-04-07 | Disposition: A | Discharge status: Home / Self Care | Payer: Medicare HMO | Source: Ambulatory Visit | Attending: Radiation Oncology | Admitting: Radiation Oncology

## 2019-04-07 ENCOUNTER — Encounter: Payer: Self-pay | Admitting: Radiation Oncology

## 2019-04-07 VITALS — BP 172/91 | HR 91 | Resp 16 | Wt 127.7 lb

## 2019-04-07 DIAGNOSIS — Z79899 Other long term (current) drug therapy: Secondary | ICD-10-CM | POA: Insufficient documentation

## 2019-04-07 DIAGNOSIS — Z9221 Personal history of antineoplastic chemotherapy: Secondary | ICD-10-CM | POA: Insufficient documentation

## 2019-04-07 DIAGNOSIS — R69 Illness, unspecified: Secondary | ICD-10-CM | POA: Diagnosis not present

## 2019-04-07 DIAGNOSIS — C779 Secondary and unspecified malignant neoplasm of lymph node, unspecified: Secondary | ICD-10-CM | POA: Diagnosis not present

## 2019-04-07 DIAGNOSIS — C182 Malignant neoplasm of ascending colon: Secondary | ICD-10-CM | POA: Insufficient documentation

## 2019-04-07 DIAGNOSIS — E860 Dehydration: Secondary | ICD-10-CM | POA: Diagnosis not present

## 2019-04-07 DIAGNOSIS — D509 Iron deficiency anemia, unspecified: Secondary | ICD-10-CM | POA: Diagnosis not present

## 2019-04-07 DIAGNOSIS — K219 Gastro-esophageal reflux disease without esophagitis: Secondary | ICD-10-CM | POA: Diagnosis not present

## 2019-04-07 DIAGNOSIS — Z803 Family history of malignant neoplasm of breast: Secondary | ICD-10-CM | POA: Insufficient documentation

## 2019-04-07 DIAGNOSIS — F17210 Nicotine dependence, cigarettes, uncomplicated: Secondary | ICD-10-CM | POA: Diagnosis not present

## 2019-04-07 NOTE — Consult Note (Signed)
NEW PATIENT EVALUATION  Name: Mary Beltran  MRN: [id number]  Date:   04/07/2019     DOB: 02/26/1941   This 78 y.o. 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  REFERRING PHYSICIAN: [name], MD  CHIEF COMPLAINT:  Chief Complaint  Patient presents with  . Cancer    Initial consultation     DIAGNOSIS: The encounter diagnosis was Cancer of right colon ([place]).   PREVIOUS INVESTIGATIONS:  CT scans reviewed Pathology report reviewed Clinic notes reviewed  HPI: Patient is a 78 year old female who originally presented at the time of acute cholecystitis found to have iron deficiency anemia with a hemoglobin around 9.2.  She had same day laparoscopic surgery for cholecystectomy.  The iron deficiency anemia eventually prompted a gastroenterology to perform endoscopy Taji 2020 showing a partially obstructing mass in the cecum biopsy positive for invasive grade 2 adenocarcinoma.  Patient had an accompanying 10 pound weight loss at the time.  She underwent a right hemicolectomy July 2020 showing invasive moderately differentiated adenocarcinoma involving the cecum and adjacent terminal ileum with extension into the perirectal adipose tissue and focal positive inked serosal surface.  Pathologic stage was T4 AN1.1M0.  There are actually 2 masses 1 in the cecum measuring 4.2 cm and 1 in the proximal ascending colon measuring 6 cm.  Again tumor was grade 2 moderately differentiated adenocarcinoma.  Tumor invaded the visceral peritoneum with tumor continuous with serosal surface through an area of inflammation.  There was lymph vascular invasion present.  There was 1 area of tumor deposit.  1 of 33 lymph nodes was positive for metastatic disease.  Patient was seen by Dr. Dennis Bast underwent adjuvant 5-FU chemotherapy was only able to complete 5 cycles and discontinued secondary to  side effect profile including significant dehydration nausea and vomiting.  She is recovered nicely is now referred to radiation oncology for consideration of adjuvant treatment.  She states her bowel movements are doing well.  She is having no upper abdominal pain or discomfort weight is stable.  She is accompanied today by her son.  PLANNED TREATMENT REGIMEN: Right upper quadrant adjuvant radiation therapy using 3-dimensional treatment planning  PAST MEDICAL HISTORY:  has a past medical history of Cancer ([place]), Complication of anesthesia, GERD (gastroesophageal reflux disease), IDA (iron deficiency anemia) (10/13/2018), and PONV (postoperative nausea and vomiting).    PAST SURGICAL HISTORY:  Past Surgical History:  Procedure Laterality Date  . ABDOMINAL HYSTERECTOMY     complete  . APPENDECTOMY    . CHOLECYSTECTOMY N/A 08/22/2018   Procedure: LAPAROSCOPIC CHOLECYSTECTOMY;  Surgeon: Herbert Pun, MD;  Location: [facility] ORS;  Service: General;  Laterality: N/A;  . COLONOSCOPY    . LAPAROSCOPIC RIGHT COLECTOMY N/A 10/23/2018   Procedure: LAPAROSCOPIC HAND ASSISTED RIGHT COLECTOMY;  Surgeon: Herbert Pun, MD;  Location: [facility] ORS;  Service: General;  Laterality: N/A;  . PERCUTANEOUS PINNING WRIST FRACTURE Right   . PORTACATH PLACEMENT Right 12/08/2018   Procedure: INSERTION PORT-A-CATH, CENTRAL VENOUS CATH WITH SUBCUTANEOUS INFUSION PORT;  Surgeon: Herbert Pun, MD;  Location: [facility] ORS;  Service: General;  Laterality: Right;  . WRIST FOREIGN BODY REMOVAL Right    2016    FAMILY HISTORY: family history includes Breast cancer in her paternal grandmother; COPD in her mother; Failure to thrive in her sister; Suicidality in her father.  SOCIAL HISTORY:  reports that she has been smoking cigarettes. She has  a 15.00 pack-year smoking history. She has never used smokeless tobacco. She reports previous drug use. She reports that she does not drink alcohol.  ALLERGIES: Pain & fever  [acetaminophen], Sulfa antibiotics, Ciprofloxacin, Codeine, and Penicillins  MEDICATIONS:  Current Outpatient Medications  Medication Sig Dispense Refill  . diphenhydrAMINE (BENADRYL) 25 mg capsule Take 1 capsule (25 mg total) by mouth every 8 (eight) hours as needed for itching. 30 capsule 0  . loperamide (IMODIUM) 2 MG capsule Take 1 capsule (2 mg total) by mouth as needed for diarrhea or loose stools. 30 capsule 0  . Multiple Vitamin (MULTIVITAMIN WITH MINERALS) TABS tablet Take 1 tablet by mouth daily.    . potassium chloride (KLOR-CON) 20 MEQ packet Take 20 mEq by mouth 2 (two) times daily for 3 days. 6 packet 0   No current facility-administered medications for this encounter.    ECOG PERFORMANCE STATUS:  0 - Asymptomatic  REVIEW OF SYSTEMS: Patient denies any weight loss, fatigue, weakness, fever, chills or night sweats. Patient denies any loss of vision, blurred vision. Patient denies any ringing  of the ears or hearing loss. No irregular heartbeat. Patient denies heart murmur or history of fainting. Patient denies any chest pain or pain radiating to her upper extremities. Patient denies any shortness of breath, difficulty breathing at night, cough or hemoptysis. Patient denies any swelling in the lower legs. Patient denies any nausea vomiting, vomiting of blood, or coffee ground material in the vomitus. Patient denies any stomach pain. Patient states has had normal bowel movements no significant constipation or diarrhea. Patient denies any dysuria, hematuria or significant nocturia. Patient denies any problems walking, swelling in the joints or loss of balance. Patient denies any skin changes, loss of hair or loss of weight. Patient denies any excessive worrying or anxiety or significant depression. Patient denies any problems with insomnia. Patient denies excessive thirst, polyuria, polydipsia. Patient denies any swollen glands, patient denies easy bruising or easy bleeding. Patient denies  any recent infections, allergies or URI. Patient "s visual fields have not changed significantly in recent time.   PHYSICAL EXAM: BP (!) 172/91 (BP Location: Left Arm, Patient Position: Sitting)   Pulse 91   Resp 16   Wt 127 lb 11.2 oz (57.9 kg)   BMI 20.00 kg/m  Well-developed well-nourished patient in NAD. HEENT reveals PERLA, EOMI, discs not visualized.  Oral cavity is clear. No oral mucosal lesions are identified. Neck is clear without evidence of cervical or supraclavicular adenopathy. Lungs are clear to A&P. Cardiac examination is essentially unremarkable with regular rate and rhythm without murmur rub or thrill. Abdomen is benign with no organomegaly or masses noted. Motor sensory and DTR levels are equal and symmetric in the upper and lower extremities. Cranial nerves II through XII are grossly intact. Proprioception is intact. No peripheral adenopathy or edema is identified. No motor or sensory levels are noted. Crude visual fields are within normal range.  LABORATORY DATA: Pathology report reviewed    RADIOLOGY RESULTS: CT scans prior to surgery reviewed   IMPRESSION: Stage III (T4 aN1 M0) grade 2 moderately well differentiated adenocarcinoma of the cecum status post right hemicolectomy in 78 year old female with adjuvant 5-FU chemotherapy discontinued secondary to side effect profile.  PLAN: At this time I have recommended adjuvant radiation therapy.  I would use her presurgical CT scan in a fusion type study for delineation of treatment area.  I would plan on delivering 4500 cGy over 5 weeks.  Risks and benefits of treatment including  possible diarrhea fatigue skin reaction slight chance of abdominal scarring alteration of blood counts all were described in detail.  Patient and her son comprehend my treatment plan well.  I have also brought up the idea of possible concurrent chemotherapy although patient is adamant about refusing any further chemotherapy based on her previous  experience.  I still will run that by Dr. Dennis Bast for possible input.  I have personally set up and ordered CT simulation for next week.  I would like to take this opportunity to thank you for allowing me to participate in the care of your patient.Noreene Filbert, MD

## 2019-04-13 ENCOUNTER — Other Ambulatory Visit: Payer: Self-pay

## 2019-04-14 ENCOUNTER — Ambulatory Visit
Admission: RE | Admit: 2019-04-14 | Discharge: 2019-04-14 | Disposition: A | Discharge status: Home / Self Care | Payer: Medicare HMO | Source: Ambulatory Visit | Attending: Radiation Oncology | Admitting: Radiation Oncology

## 2019-04-14 ENCOUNTER — Other Ambulatory Visit: Payer: Self-pay

## 2019-04-14 DIAGNOSIS — C779 Secondary and unspecified malignant neoplasm of lymph node, unspecified: Secondary | ICD-10-CM | POA: Diagnosis not present

## 2019-04-14 DIAGNOSIS — Z510 Encounter for antineoplastic radiation therapy: Secondary | ICD-10-CM | POA: Insufficient documentation

## 2019-04-14 DIAGNOSIS — C182 Malignant neoplasm of ascending colon: Secondary | ICD-10-CM | POA: Insufficient documentation

## 2019-04-16 ENCOUNTER — Inpatient Hospital Stay: Payer: Medicare HMO | Attending: Oncology

## 2019-04-16 DIAGNOSIS — C779 Secondary and unspecified malignant neoplasm of lymph node, unspecified: Secondary | ICD-10-CM | POA: Diagnosis not present

## 2019-04-16 DIAGNOSIS — Z95828 Presence of other vascular implants and grafts: Secondary | ICD-10-CM | POA: Diagnosis not present

## 2019-04-16 DIAGNOSIS — C182 Malignant neoplasm of ascending colon: Secondary | ICD-10-CM | POA: Insufficient documentation

## 2019-04-16 DIAGNOSIS — Z510 Encounter for antineoplastic radiation therapy: Secondary | ICD-10-CM | POA: Diagnosis not present

## 2019-04-16 NOTE — Progress Notes (Signed)
Nutrition Follow-up:  Patient with colon cancer followed by Dr. Tasia Catchings.  Noted hospital admission 12/2-12/4 with nausea, vomiting, diarrhea, dehydration.    Spoke with patient via phone this am.  Patient just coming from having port removed.  Reports that she has decided not to have anymore chemotherapy treatments.  Planning to start radiation treatments for 5 weeks.  Reports that appetite is better following hospital admission and she has gained weight.  Eating good sources of protein (peanut butter, chicken, steak, yogurt) as variety of foods.    Medications: reviewed  Labs: reviewed  Anthropometrics:   Weight 127 lb 11.2 oz on 12/29 increased from 120 lb on 12/2.  Weight was running 130-132 lb.    NUTRITION DIAGNOSIS: Increased nutrient needs continue during treatment   INTERVENTION:  Encouraged patient to continue with eating well-balanced diet including good sources of protein.   Discussed juice type oral nutrition supplements as does not like the "milky" shakes.   Patient prefers to reach out to RD in the future if needed   NEXT VISIT: no follow-up planned  Salih Williamson B. Zenia Resides, [place], [place] Registered Dietitian [phone] (pager)

## 2019-04-17 ENCOUNTER — Other Ambulatory Visit: Payer: Self-pay | Admitting: *Deleted

## 2019-04-17 DIAGNOSIS — C182 Malignant neoplasm of ascending colon: Secondary | ICD-10-CM

## 2019-04-20 ENCOUNTER — Other Ambulatory Visit: Payer: Self-pay

## 2019-04-20 ENCOUNTER — Ambulatory Visit
Admission: RE | Admit: 2019-04-20 | Discharge: 2019-04-20 | Disposition: A | Discharge status: Home / Self Care | Payer: Medicare HMO | Source: Ambulatory Visit | Attending: Radiation Oncology | Admitting: Radiation Oncology

## 2019-04-20 DIAGNOSIS — C779 Secondary and unspecified malignant neoplasm of lymph node, unspecified: Secondary | ICD-10-CM | POA: Diagnosis not present

## 2019-04-20 DIAGNOSIS — C182 Malignant neoplasm of ascending colon: Secondary | ICD-10-CM | POA: Diagnosis not present

## 2019-04-20 DIAGNOSIS — Z510 Encounter for antineoplastic radiation therapy: Secondary | ICD-10-CM | POA: Diagnosis not present

## 2019-04-21 ENCOUNTER — Ambulatory Visit: Payer: Medicare HMO

## 2019-04-21 ENCOUNTER — Other Ambulatory Visit: Payer: Self-pay

## 2019-04-21 ENCOUNTER — Ambulatory Visit
Admission: RE | Admit: 2019-04-21 | Discharge: 2019-04-21 | Disposition: A | Discharge status: Home / Self Care | Payer: Medicare HMO | Source: Ambulatory Visit | Attending: Radiation Oncology | Admitting: Radiation Oncology

## 2019-04-21 DIAGNOSIS — C779 Secondary and unspecified malignant neoplasm of lymph node, unspecified: Secondary | ICD-10-CM | POA: Diagnosis not present

## 2019-04-21 DIAGNOSIS — C182 Malignant neoplasm of ascending colon: Secondary | ICD-10-CM | POA: Diagnosis not present

## 2019-04-21 DIAGNOSIS — Z510 Encounter for antineoplastic radiation therapy: Secondary | ICD-10-CM | POA: Diagnosis not present

## 2019-04-22 ENCOUNTER — Other Ambulatory Visit: Payer: Self-pay

## 2019-04-22 ENCOUNTER — Ambulatory Visit
Admission: RE | Admit: 2019-04-22 | Discharge: 2019-04-22 | Disposition: A | Discharge status: Home / Self Care | Payer: Medicare HMO | Source: Ambulatory Visit | Attending: Radiation Oncology | Admitting: Radiation Oncology

## 2019-04-22 DIAGNOSIS — C779 Secondary and unspecified malignant neoplasm of lymph node, unspecified: Secondary | ICD-10-CM | POA: Diagnosis not present

## 2019-04-22 DIAGNOSIS — C182 Malignant neoplasm of ascending colon: Secondary | ICD-10-CM | POA: Diagnosis not present

## 2019-04-22 DIAGNOSIS — Z510 Encounter for antineoplastic radiation therapy: Secondary | ICD-10-CM | POA: Diagnosis not present

## 2019-04-23 ENCOUNTER — Ambulatory Visit
Admission: RE | Admit: 2019-04-23 | Discharge: 2019-04-23 | Disposition: A | Discharge status: Home / Self Care | Payer: Medicare HMO | Source: Ambulatory Visit | Attending: Radiation Oncology | Admitting: Radiation Oncology

## 2019-04-23 ENCOUNTER — Other Ambulatory Visit: Payer: Self-pay

## 2019-04-23 DIAGNOSIS — C779 Secondary and unspecified malignant neoplasm of lymph node, unspecified: Secondary | ICD-10-CM | POA: Diagnosis not present

## 2019-04-23 DIAGNOSIS — Z510 Encounter for antineoplastic radiation therapy: Secondary | ICD-10-CM | POA: Diagnosis not present

## 2019-04-23 DIAGNOSIS — C182 Malignant neoplasm of ascending colon: Secondary | ICD-10-CM | POA: Diagnosis not present

## 2019-04-24 ENCOUNTER — Ambulatory Visit
Admission: RE | Admit: 2019-04-24 | Discharge: 2019-04-24 | Disposition: A | Discharge status: Home / Self Care | Payer: Medicare HMO | Source: Ambulatory Visit | Attending: Radiation Oncology | Admitting: Radiation Oncology

## 2019-04-24 ENCOUNTER — Other Ambulatory Visit: Payer: Self-pay

## 2019-04-24 DIAGNOSIS — C182 Malignant neoplasm of ascending colon: Secondary | ICD-10-CM | POA: Diagnosis not present

## 2019-04-24 DIAGNOSIS — C779 Secondary and unspecified malignant neoplasm of lymph node, unspecified: Secondary | ICD-10-CM | POA: Diagnosis not present

## 2019-04-24 DIAGNOSIS — Z510 Encounter for antineoplastic radiation therapy: Secondary | ICD-10-CM | POA: Diagnosis not present

## 2019-04-27 ENCOUNTER — Other Ambulatory Visit: Payer: Self-pay

## 2019-04-27 ENCOUNTER — Ambulatory Visit
Admission: RE | Admit: 2019-04-27 | Discharge: 2019-04-27 | Disposition: A | Discharge status: Home / Self Care | Payer: Medicare HMO | Source: Ambulatory Visit | Attending: Radiation Oncology | Admitting: Radiation Oncology

## 2019-04-27 DIAGNOSIS — C182 Malignant neoplasm of ascending colon: Secondary | ICD-10-CM | POA: Diagnosis not present

## 2019-04-27 DIAGNOSIS — Z510 Encounter for antineoplastic radiation therapy: Secondary | ICD-10-CM | POA: Diagnosis not present

## 2019-04-27 DIAGNOSIS — C779 Secondary and unspecified malignant neoplasm of lymph node, unspecified: Secondary | ICD-10-CM | POA: Diagnosis not present

## 2019-04-28 ENCOUNTER — Ambulatory Visit
Admission: RE | Admit: 2019-04-28 | Discharge: 2019-04-28 | Disposition: A | Discharge status: Home / Self Care | Payer: Medicare HMO | Source: Ambulatory Visit | Attending: Radiation Oncology | Admitting: Radiation Oncology

## 2019-04-28 ENCOUNTER — Other Ambulatory Visit: Payer: Self-pay

## 2019-04-28 DIAGNOSIS — Z510 Encounter for antineoplastic radiation therapy: Secondary | ICD-10-CM | POA: Diagnosis not present

## 2019-04-28 DIAGNOSIS — E43 Unspecified severe protein-calorie malnutrition: Secondary | ICD-10-CM | POA: Diagnosis not present

## 2019-04-28 DIAGNOSIS — Z85038 Personal history of other malignant neoplasm of large intestine: Secondary | ICD-10-CM | POA: Diagnosis not present

## 2019-04-28 DIAGNOSIS — R69 Illness, unspecified: Secondary | ICD-10-CM | POA: Diagnosis not present

## 2019-04-28 DIAGNOSIS — Z0000 Encounter for general adult medical examination without abnormal findings: Secondary | ICD-10-CM | POA: Diagnosis not present

## 2019-04-28 DIAGNOSIS — C182 Malignant neoplasm of ascending colon: Secondary | ICD-10-CM | POA: Diagnosis not present

## 2019-04-28 DIAGNOSIS — C779 Secondary and unspecified malignant neoplasm of lymph node, unspecified: Secondary | ICD-10-CM | POA: Diagnosis not present

## 2019-04-29 ENCOUNTER — Ambulatory Visit
Admission: RE | Admit: 2019-04-29 | Discharge: 2019-04-29 | Disposition: A | Discharge status: Home / Self Care | Payer: Medicare HMO | Source: Ambulatory Visit | Attending: Radiation Oncology | Admitting: Radiation Oncology

## 2019-04-29 ENCOUNTER — Other Ambulatory Visit: Payer: Self-pay

## 2019-04-29 DIAGNOSIS — Z510 Encounter for antineoplastic radiation therapy: Secondary | ICD-10-CM | POA: Diagnosis not present

## 2019-04-29 DIAGNOSIS — C779 Secondary and unspecified malignant neoplasm of lymph node, unspecified: Secondary | ICD-10-CM | POA: Diagnosis not present

## 2019-04-29 DIAGNOSIS — C182 Malignant neoplasm of ascending colon: Secondary | ICD-10-CM | POA: Diagnosis not present

## 2019-04-30 ENCOUNTER — Other Ambulatory Visit: Payer: Self-pay

## 2019-04-30 ENCOUNTER — Ambulatory Visit
Admission: RE | Admit: 2019-04-30 | Discharge: 2019-04-30 | Disposition: A | Discharge status: Home / Self Care | Payer: Medicare HMO | Source: Ambulatory Visit | Attending: Radiation Oncology | Admitting: Radiation Oncology

## 2019-04-30 DIAGNOSIS — Z510 Encounter for antineoplastic radiation therapy: Secondary | ICD-10-CM | POA: Diagnosis not present

## 2019-04-30 DIAGNOSIS — C779 Secondary and unspecified malignant neoplasm of lymph node, unspecified: Secondary | ICD-10-CM | POA: Diagnosis not present

## 2019-04-30 DIAGNOSIS — C182 Malignant neoplasm of ascending colon: Secondary | ICD-10-CM | POA: Diagnosis not present

## 2019-05-01 ENCOUNTER — Ambulatory Visit
Admission: RE | Admit: 2019-05-01 | Discharge: 2019-05-01 | Disposition: A | Discharge status: Home / Self Care | Payer: Medicare HMO | Source: Ambulatory Visit | Attending: Radiation Oncology | Admitting: Radiation Oncology

## 2019-05-01 ENCOUNTER — Other Ambulatory Visit: Payer: Self-pay

## 2019-05-01 DIAGNOSIS — C182 Malignant neoplasm of ascending colon: Secondary | ICD-10-CM | POA: Diagnosis not present

## 2019-05-01 DIAGNOSIS — C779 Secondary and unspecified malignant neoplasm of lymph node, unspecified: Secondary | ICD-10-CM | POA: Diagnosis not present

## 2019-05-01 DIAGNOSIS — Z510 Encounter for antineoplastic radiation therapy: Secondary | ICD-10-CM | POA: Diagnosis not present

## 2019-05-04 ENCOUNTER — Ambulatory Visit
Admission: RE | Admit: 2019-05-04 | Discharge: 2019-05-04 | Disposition: A | Discharge status: Home / Self Care | Payer: Medicare HMO | Source: Ambulatory Visit | Attending: Radiation Oncology | Admitting: Radiation Oncology

## 2019-05-04 ENCOUNTER — Other Ambulatory Visit: Payer: Self-pay

## 2019-05-04 DIAGNOSIS — C182 Malignant neoplasm of ascending colon: Secondary | ICD-10-CM | POA: Diagnosis not present

## 2019-05-04 DIAGNOSIS — Z510 Encounter for antineoplastic radiation therapy: Secondary | ICD-10-CM | POA: Diagnosis not present

## 2019-05-04 DIAGNOSIS — C779 Secondary and unspecified malignant neoplasm of lymph node, unspecified: Secondary | ICD-10-CM | POA: Diagnosis not present

## 2019-05-05 ENCOUNTER — Other Ambulatory Visit: Payer: Self-pay

## 2019-05-05 ENCOUNTER — Ambulatory Visit
Admission: RE | Admit: 2019-05-05 | Discharge: 2019-05-05 | Disposition: A | Discharge status: Home / Self Care | Payer: Medicare HMO | Source: Ambulatory Visit | Attending: Radiation Oncology | Admitting: Radiation Oncology

## 2019-05-05 DIAGNOSIS — Z510 Encounter for antineoplastic radiation therapy: Secondary | ICD-10-CM | POA: Diagnosis not present

## 2019-05-05 DIAGNOSIS — C182 Malignant neoplasm of ascending colon: Secondary | ICD-10-CM | POA: Diagnosis not present

## 2019-05-05 DIAGNOSIS — C779 Secondary and unspecified malignant neoplasm of lymph node, unspecified: Secondary | ICD-10-CM | POA: Diagnosis not present

## 2019-05-06 ENCOUNTER — Other Ambulatory Visit: Payer: Medicare HMO

## 2019-05-06 ENCOUNTER — Other Ambulatory Visit: Payer: Self-pay

## 2019-05-06 ENCOUNTER — Ambulatory Visit
Admission: RE | Admit: 2019-05-06 | Discharge: 2019-05-06 | Disposition: A | Discharge status: Home / Self Care | Payer: Medicare HMO | Source: Ambulatory Visit | Attending: Radiation Oncology | Admitting: Radiation Oncology

## 2019-05-06 ENCOUNTER — Inpatient Hospital Stay: Payer: Medicare HMO

## 2019-05-06 DIAGNOSIS — C779 Secondary and unspecified malignant neoplasm of lymph node, unspecified: Secondary | ICD-10-CM | POA: Diagnosis not present

## 2019-05-06 DIAGNOSIS — Z510 Encounter for antineoplastic radiation therapy: Secondary | ICD-10-CM | POA: Diagnosis not present

## 2019-05-06 DIAGNOSIS — C182 Malignant neoplasm of ascending colon: Secondary | ICD-10-CM | POA: Diagnosis not present

## 2019-05-06 LAB — COMPREHENSIVE METABOLIC PANEL
ALT: 18 U/L (ref 0–44)
AST: 21 U/L (ref 15–41)
Albumin: 4.2 g/dL (ref 3.5–5.0)
Alkaline Phosphatase: 69 U/L (ref 38–126)
Anion gap: 6 (ref 5–15)
BUN: 8 mg/dL (ref 8–23)
CO2: 25 mmol/L (ref 22–32)
Calcium: 9.4 mg/dL (ref 8.9–10.3)
Chloride: 106 mmol/L (ref 98–111)
Creatinine, Ser: 0.49 mg/dL (ref 0.44–1.00)
GFR calc Af Amer: >60 mL/min (ref >60)
GFR calc non Af Amer: >60 mL/min (ref >60)
Glucose, Bld: 94 mg/dL (ref 70–99)
Potassium: 4.3 mmol/L (ref 3.5–5.1)
Sodium: 137 mmol/L (ref 135–145)
Total Bilirubin: 0.7 mg/dL (ref 0.3–1.2)
Total Protein: 7.3 g/dL (ref 6.5–8.1)

## 2019-05-06 LAB — CBC WITH DIFFERENTIAL/PLATELET
Abs Immature Granulocytes: 0.03 10*3/uL (ref 0.00–0.07)
Basophils Absolute: 0 10*3/uL (ref 0.0–0.1)
Basophils Relative: 1 %
Eosinophils Absolute: 0.3 10*3/uL (ref 0.0–0.5)
Eosinophils Relative: 5 %
HCT: 40.2 % (ref 36.0–46.0)
Hemoglobin: 12.8 g/dL (ref 12.0–15.0)
Immature Granulocytes: 1 %
Lymphocytes Relative: 21 %
Lymphs Abs: 1.1 10*3/uL (ref 0.7–4.0)
MCH: 31.8 pg (ref 26.0–34.0)
MCHC: 31.8 g/dL (ref 30.0–36.0)
MCV: 100 fL (ref 80.0–100.0)
Monocytes Absolute: 0.5 10*3/uL (ref 0.1–1.0)
Monocytes Relative: 9 %
Neutro Abs: 3.5 10*3/uL (ref 1.7–7.7)
Neutrophils Relative %: 63 %
Platelets: 212 10*3/uL (ref 150–400)
RBC: 4.02 MIL/uL (ref 3.87–5.11)
RDW: 13.2 % (ref 11.5–15.5)
WBC: 5.4 10*3/uL (ref 4.0–10.5)
nRBC: 0 % (ref 0.0–0.2)

## 2019-05-07 ENCOUNTER — Other Ambulatory Visit: Payer: Self-pay

## 2019-05-07 ENCOUNTER — Ambulatory Visit
Admission: RE | Admit: 2019-05-07 | Discharge: 2019-05-07 | Disposition: A | Discharge status: Home / Self Care | Payer: Medicare HMO | Source: Ambulatory Visit | Attending: Radiation Oncology | Admitting: Radiation Oncology

## 2019-05-07 DIAGNOSIS — C182 Malignant neoplasm of ascending colon: Secondary | ICD-10-CM | POA: Diagnosis not present

## 2019-05-07 DIAGNOSIS — Z510 Encounter for antineoplastic radiation therapy: Secondary | ICD-10-CM | POA: Diagnosis not present

## 2019-05-07 DIAGNOSIS — C779 Secondary and unspecified malignant neoplasm of lymph node, unspecified: Secondary | ICD-10-CM | POA: Diagnosis not present

## 2019-05-08 ENCOUNTER — Ambulatory Visit
Admission: RE | Admit: 2019-05-08 | Discharge: 2019-05-08 | Disposition: A | Discharge status: Home / Self Care | Payer: Medicare HMO | Source: Ambulatory Visit | Attending: Radiation Oncology | Admitting: Radiation Oncology

## 2019-05-08 ENCOUNTER — Other Ambulatory Visit: Payer: Self-pay

## 2019-05-08 DIAGNOSIS — C779 Secondary and unspecified malignant neoplasm of lymph node, unspecified: Secondary | ICD-10-CM | POA: Diagnosis not present

## 2019-05-08 DIAGNOSIS — C182 Malignant neoplasm of ascending colon: Secondary | ICD-10-CM | POA: Diagnosis not present

## 2019-05-08 DIAGNOSIS — Z510 Encounter for antineoplastic radiation therapy: Secondary | ICD-10-CM | POA: Diagnosis not present

## 2019-05-11 ENCOUNTER — Other Ambulatory Visit: Payer: Self-pay

## 2019-05-11 ENCOUNTER — Ambulatory Visit
Admission: RE | Admit: 2019-05-11 | Discharge: 2019-05-11 | Disposition: A | Discharge status: Home / Self Care | Payer: Medicare HMO | Source: Ambulatory Visit | Attending: Radiation Oncology | Admitting: Radiation Oncology

## 2019-05-11 DIAGNOSIS — C182 Malignant neoplasm of ascending colon: Secondary | ICD-10-CM | POA: Diagnosis not present

## 2019-05-11 DIAGNOSIS — C779 Secondary and unspecified malignant neoplasm of lymph node, unspecified: Secondary | ICD-10-CM | POA: Diagnosis not present

## 2019-05-11 DIAGNOSIS — Z510 Encounter for antineoplastic radiation therapy: Secondary | ICD-10-CM | POA: Diagnosis present

## 2019-05-12 ENCOUNTER — Other Ambulatory Visit: Payer: Self-pay

## 2019-05-12 ENCOUNTER — Ambulatory Visit
Admission: RE | Admit: 2019-05-12 | Discharge: 2019-05-12 | Disposition: A | Discharge status: Home / Self Care | Payer: Medicare HMO | Source: Ambulatory Visit | Attending: Radiation Oncology | Admitting: Radiation Oncology

## 2019-05-12 DIAGNOSIS — Z510 Encounter for antineoplastic radiation therapy: Secondary | ICD-10-CM | POA: Diagnosis not present

## 2019-05-12 DIAGNOSIS — C779 Secondary and unspecified malignant neoplasm of lymph node, unspecified: Secondary | ICD-10-CM | POA: Diagnosis not present

## 2019-05-12 DIAGNOSIS — C182 Malignant neoplasm of ascending colon: Secondary | ICD-10-CM | POA: Diagnosis not present

## 2019-05-13 ENCOUNTER — Inpatient Hospital Stay: Payer: Medicare HMO | Attending: Radiation Oncology

## 2019-05-13 ENCOUNTER — Other Ambulatory Visit: Payer: Self-pay

## 2019-05-13 ENCOUNTER — Ambulatory Visit
Admission: RE | Admit: 2019-05-13 | Discharge: 2019-05-13 | Disposition: A | Discharge status: Home / Self Care | Payer: Medicare HMO | Source: Ambulatory Visit | Attending: Radiation Oncology | Admitting: Radiation Oncology

## 2019-05-13 DIAGNOSIS — R197 Diarrhea, unspecified: Secondary | ICD-10-CM | POA: Diagnosis not present

## 2019-05-13 DIAGNOSIS — Z510 Encounter for antineoplastic radiation therapy: Secondary | ICD-10-CM | POA: Diagnosis not present

## 2019-05-13 DIAGNOSIS — Z800 Family history of malignant neoplasm of digestive organs: Secondary | ICD-10-CM | POA: Diagnosis not present

## 2019-05-13 DIAGNOSIS — K5900 Constipation, unspecified: Secondary | ICD-10-CM | POA: Diagnosis not present

## 2019-05-13 DIAGNOSIS — C779 Secondary and unspecified malignant neoplasm of lymph node, unspecified: Secondary | ICD-10-CM | POA: Diagnosis not present

## 2019-05-13 DIAGNOSIS — Z803 Family history of malignant neoplasm of breast: Secondary | ICD-10-CM | POA: Insufficient documentation

## 2019-05-13 DIAGNOSIS — Z9221 Personal history of antineoplastic chemotherapy: Secondary | ICD-10-CM | POA: Insufficient documentation

## 2019-05-13 DIAGNOSIS — C182 Malignant neoplasm of ascending colon: Secondary | ICD-10-CM | POA: Diagnosis not present

## 2019-05-13 LAB — CBC
HCT: 38.7 % (ref 36.0–46.0)
Hemoglobin: 12.4 g/dL (ref 12.0–15.0)
MCH: 32.4 pg (ref 26.0–34.0)
MCHC: 32 g/dL (ref 30.0–36.0)
MCV: 101 fL — ABNORMAL HIGH (ref 80.0–100.0)
Platelets: 201 10*3/uL (ref 150–400)
RBC: 3.83 MIL/uL — ABNORMAL LOW (ref 3.87–5.11)
RDW: 13.2 % (ref 11.5–15.5)
WBC: 6.8 10*3/uL (ref 4.0–10.5)
nRBC: 0 % (ref 0.0–0.2)

## 2019-05-14 ENCOUNTER — Other Ambulatory Visit: Payer: Self-pay

## 2019-05-14 ENCOUNTER — Ambulatory Visit
Admission: RE | Admit: 2019-05-14 | Discharge: 2019-05-14 | Disposition: A | Discharge status: Home / Self Care | Payer: Medicare HMO | Source: Ambulatory Visit | Attending: Radiation Oncology | Admitting: Radiation Oncology

## 2019-05-14 DIAGNOSIS — Z510 Encounter for antineoplastic radiation therapy: Secondary | ICD-10-CM | POA: Diagnosis not present

## 2019-05-14 DIAGNOSIS — C182 Malignant neoplasm of ascending colon: Secondary | ICD-10-CM | POA: Diagnosis not present

## 2019-05-14 DIAGNOSIS — C779 Secondary and unspecified malignant neoplasm of lymph node, unspecified: Secondary | ICD-10-CM | POA: Diagnosis not present

## 2019-05-15 ENCOUNTER — Other Ambulatory Visit: Payer: Self-pay

## 2019-05-15 ENCOUNTER — Ambulatory Visit
Admission: RE | Admit: 2019-05-15 | Discharge: 2019-05-15 | Disposition: A | Discharge status: Home / Self Care | Payer: Medicare HMO | Source: Ambulatory Visit | Attending: Radiation Oncology | Admitting: Radiation Oncology

## 2019-05-15 DIAGNOSIS — C182 Malignant neoplasm of ascending colon: Secondary | ICD-10-CM | POA: Diagnosis not present

## 2019-05-15 DIAGNOSIS — Z510 Encounter for antineoplastic radiation therapy: Secondary | ICD-10-CM | POA: Diagnosis not present

## 2019-05-15 DIAGNOSIS — C779 Secondary and unspecified malignant neoplasm of lymph node, unspecified: Secondary | ICD-10-CM | POA: Diagnosis not present

## 2019-05-18 ENCOUNTER — Inpatient Hospital Stay (HOSPITAL_BASED_OUTPATIENT_CLINIC_OR_DEPARTMENT_OTHER): Payer: Medicare HMO | Admitting: Oncology

## 2019-05-18 ENCOUNTER — Ambulatory Visit: Payer: Medicare HMO

## 2019-05-18 ENCOUNTER — Other Ambulatory Visit: Payer: Self-pay

## 2019-05-18 ENCOUNTER — Inpatient Hospital Stay: Payer: Medicare HMO

## 2019-05-18 ENCOUNTER — Other Ambulatory Visit: Payer: Self-pay | Admitting: *Deleted

## 2019-05-18 ENCOUNTER — Other Ambulatory Visit: Payer: Medicare HMO

## 2019-05-18 VITALS — BP 138/54 | HR 85 | Temp 97.4°F | Resp 16

## 2019-05-18 DIAGNOSIS — R197 Diarrhea, unspecified: Secondary | ICD-10-CM

## 2019-05-18 DIAGNOSIS — K5900 Constipation, unspecified: Secondary | ICD-10-CM | POA: Diagnosis not present

## 2019-05-18 DIAGNOSIS — R531 Weakness: Secondary | ICD-10-CM

## 2019-05-18 DIAGNOSIS — Z9221 Personal history of antineoplastic chemotherapy: Secondary | ICD-10-CM | POA: Diagnosis not present

## 2019-05-18 DIAGNOSIS — Z800 Family history of malignant neoplasm of digestive organs: Secondary | ICD-10-CM | POA: Diagnosis not present

## 2019-05-18 DIAGNOSIS — C182 Malignant neoplasm of ascending colon: Secondary | ICD-10-CM

## 2019-05-18 DIAGNOSIS — Z803 Family history of malignant neoplasm of breast: Secondary | ICD-10-CM | POA: Diagnosis not present

## 2019-05-18 LAB — CBC WITH DIFFERENTIAL/PLATELET
Abs Immature Granulocytes: 0.03 10*3/uL (ref 0.00–0.07)
Basophils Absolute: 0 10*3/uL (ref 0.0–0.1)
Basophils Relative: 0 %
Eosinophils Absolute: 0.1 10*3/uL (ref 0.0–0.5)
Eosinophils Relative: 1 %
HCT: 42.9 % (ref 36.0–46.0)
Hemoglobin: 13.4 g/dL (ref 12.0–15.0)
Immature Granulocytes: 1 %
Lymphocytes Relative: 8 %
Lymphs Abs: 0.6 10*3/uL — ABNORMAL LOW (ref 0.7–4.0)
MCH: 31.5 pg (ref 26.0–34.0)
MCHC: 31.2 g/dL (ref 30.0–36.0)
MCV: 100.9 fL — ABNORMAL HIGH (ref 80.0–100.0)
Monocytes Absolute: 0.6 10*3/uL (ref 0.1–1.0)
Monocytes Relative: 10 %
Neutro Abs: 5.3 10*3/uL (ref 1.7–7.7)
Neutrophils Relative %: 80 %
Platelets: 184 10*3/uL (ref 150–400)
RBC: 4.25 MIL/uL (ref 3.87–5.11)
RDW: 12.9 % (ref 11.5–15.5)
WBC: 6.5 10*3/uL (ref 4.0–10.5)
nRBC: 0 % (ref 0.0–0.2)

## 2019-05-18 LAB — COMPREHENSIVE METABOLIC PANEL
ALT: 14 U/L (ref 0–44)
AST: 17 U/L (ref 15–41)
Albumin: 4.3 g/dL (ref 3.5–5.0)
Alkaline Phosphatase: 71 U/L (ref 38–126)
Anion gap: 10 (ref 5–15)
BUN: 9 mg/dL (ref 8–23)
CO2: 24 mmol/L (ref 22–32)
Calcium: 10.1 mg/dL (ref 8.9–10.3)
Chloride: 101 mmol/L (ref 98–111)
Creatinine, Ser: 0.55 mg/dL (ref 0.44–1.00)
GFR calc Af Amer: >60 mL/min (ref >60)
GFR calc non Af Amer: >60 mL/min (ref >60)
Glucose, Bld: 98 mg/dL (ref 70–99)
Potassium: 4.3 mmol/L (ref 3.5–5.1)
Sodium: 135 mmol/L (ref 135–145)
Total Bilirubin: 0.5 mg/dL (ref 0.3–1.2)
Total Protein: 7.3 g/dL (ref 6.5–8.1)

## 2019-05-18 LAB — MAGNESIUM: Magnesium: 1.8 mg/dL (ref 1.7–2.4)

## 2019-05-18 MED ORDER — HYDROCORT-PRAMOXINE (PERIANAL) 1-1 % EX FOAM: 1.0000 | Freq: Two times a day (BID) | CUTANEOUS | 2 refills | Status: DC

## 2019-05-18 NOTE — Progress Notes (Signed)
Symptom Management Consult note [facility]  [phone] [phone]  Patient Care Team: [name], MD as PCP - General (Internal Medicine) Clent Jacks, RN as Oncology Nurse Navigator   Name of the patient: Mary Beltran  [id number]  Jaculin 06, 1942   Date of visit: 05/18/2019  Diagnosis: Colon Cancer   Chief Complaint: Diarrhea  Current Treatment: Radiation  Oncology History:  Oncology History Overview Note  Mary Beltran is a  79 y.o.  female with PMH listed below was seen in consultation at the request of  [name], MD  for evaluation of colon cancer Patient had acute cholecystitis and had laparoscopic cholecystectomy same day by Dr. Peyton Najjar. Patient was found to have iron deficiency anemia with hemoglobin around 9.2, ferritin 7 and iron 24. Baseline hemoglobin 1 year ago was 13.1.  Patient was started on iron supplementation with Ferrex 150 mg iron capsule daily. Reports to have chronic constipation.  Denies any blood in the stool. Patient was seen by gastroenterology and had endoscopy and colonoscopy done on 10/06/2018. There was a partially obstructing mass in the cecum which was biopsied. 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 Gastric mucosa shows reactive foveolar hyperplasia, stromal fibrosis and focal chronic inflammation.  Negative for H. pylori.  Duodenal biopsy shows no abnormalities.  No evidence of celiac disease.  Sigmoid polyps showed tubular adenoma. 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 10 pound weight loss within past few months. Denies any family history colon cancer.  Paternal grandmother passed away due to breast cancer. Per patient's request, I called patient's son [name] and sister [name], and put both of them on speaker phone so they can hear the conversation and participate in discussion.   # 10/23/2018, she underwent right  hemicolectomy Pathology showed invasive moderately differentiated adenocarcinoma involving cecum and adjacent terminal ileum with extension into pericecal adipose tissue and focally to inked serosal surface. Invasive moderately differentiated adenocarcinoma involving proximal ascending colon and ileocecal valve with extension into pericolonic adipose tissue. Tubular adenoma, sessile serrated polyp in the cecum, tubular adenoma sessile serrated polyp in the ascending colon.  Grade 2, tumor invades visceral peritoneum, all margins are negative. Pathological staging pT4a pN1c   #Iron deficiency anemia, status post IV Venofer treatments.     Cancer of right colon ([place])  10/23/2018 Initial Diagnosis   Cancer of right colon ([place])   10/31/2018 Cancer Staging   Staging form: Colon and Rectum, AJCC 8th Edition - Pathologic stage from 10/31/2018: Stage IIIB (pT4a, pN1c, cM0) - Signed by [name], MD on 10/31/2018   12/17/2018 -  Chemotherapy   The patient had leucovorin 692 mg in dextrose 5 % 250 mL infusion, 400 mg/m2 = 692 mg, Intravenous,  Once, 3 of 10 cycles Dose modification: 400 mg/m2 (original dose 400 mg/m2, Cycle 5, Reason: Other (see comments), Comment: over 66YQI per policy) fluorouracil (ADRUCIL) chemo injection 700 mg, 400 mg/m2 = 700 mg, Intravenous,  Once, 5 of 12 cycles Administration: 700 mg (12/17/2018), 700 mg (12/31/2018), 700 mg (01/28/2019), 700 mg (02/11/2019), 700 mg (02/25/2019) leucovorin injection 34 mg, 20 mg/m2 = 34 mg, Intravenous,  Once, 2 of 2 cycles Administration: 34 mg (01/28/2019) fluorouracil (ADRUCIL) 4,150 mg in sodium chloride 0.9 % 67 mL chemo infusion, 2,400 mg/m2 = 4,150 mg, Intravenous, 1 Day/Dose, 5 of 12 cycles Administration: 4,150 mg (12/17/2018), 4,150 mg (12/31/2018), 4,150 mg (01/28/2019), 4,150 mg (02/11/2019), 4,150 mg (02/25/2019)  for chemotherapy treatment.  Subjective Data:   ECOG: 1 - Symptomatic but completely ambulatory  Subjective:     Mary  Beltran is a 79 y.o. female who presents for evaluation of diarrhea. Onset of diarrhea was 3 days ago. Diarrhea is occurring approximately 6 times per day. Patient describes diarrhea as black and tarry and watery. Diarrhea has been associated with abdominal pain described as aching and daily radiation. Patient denies fever, recent antibiotic use, unintentional weight loss. Previous visits for diarrhea: none. Evaluation to date: none.    In the interim she has been evaluated by Dr. Donella Stade after completing right colectomy and adjuvant chemotherapy with 5-FU completing 5 cycles which was discontinued early secondary to side effects including dehydration nausea and vomiting.  Plan was for radiation daily x5 weeks.  Treatment began on 04/20/2019.  This is scheduled to be complete on 05/28/2019.  Review of Systems A comprehensive review of systems was negative except for: Gastrointestinal: positive for abdominal pain, diarrhea and reflux symptoms Musculoskeletal: positive for muscle weakness    Objective:    BP (!) 138/54 (BP Location: Right Arm, Patient Position: Sitting) Comment: standing bp 122/60  Pulse 85 Comment: standing pulse 97  Temp (!) 97.4 F (36.3 C) (Tympanic)   Resp 16  General: alert and cooperative  Hydration:  well hydrated  Abdomen:    soft, non-tender; bowel sounds normal; no masses,  no organomegaly    Assessment:    Acute radiation gastritis/enteritis  Plan:    Colon cancer: Patient has received 5 cycles of 5-FU.  Treatment was discontinued early due to poor tolerance including nausea, vomiting and dehydration.  She is status post colectomy and most recently is receiving daily radiation.  She is scheduled to receive 5 weeks completing on 05/28/2019.  Acute radiation gastritis/enteritis: GI toxicities are common with radiation for abdominal malignancies.  Per up-to-date, symptoms typically resolve within 1 to 2 weeks following completion of radiation.  Symptoms of acute  radiation enteritis include diarrhea, abdominal pain, nausea and vomiting, anorexia and malaise.  Radiation-induced diarrhea often appears during the third week of treatment with reports of frequency ranging from 20 to 70%. Management of radiation-induced enteritis include dietary modifications based on severity of diarrhea, antidiarrheal agents and bile acid binding resins.  Some require antibiotics for intestinal bacterial overgrowth.  Dietary modifications include avoidance of foods that are high in fiber such as fruits, vegetables and whole grains.  A high-fiber diet may worsen diarrhea and urgency.  Lomotil is recommended to decrease frequency of bowel movements and sometimes a bile acid sequestrant such as cholestyramine and colestipol is needed.   Rectal Pain: Hemorrhoids: Patient has tried Preparation H without success.  Would recommend sitz bath and topical creams.  Can try Proctofoam rectal foam for pain control.  Plan: Lab work to rule out electrolyte abnormalities and dehydration-stable Vital signs-stable Stool samples to rule out infection-unable to produce specimen in clinic patient will return to clinic.  Once we rule out infection, will try Lomotil.  RX Proctofoam 10 g-to insert into rectum twice daily as needed for hemorrhoid pain and irritation.  Disposition: RTC as scheduled to complete radiation. Dr. Donella Stade has given her 3 days off from radiation.  She is to return on Thursday, 05/21/2019.   Greater than 50% was spent in counseling and coordination of care with this patient including but not limited to discussion of the relevant topics above (See A&P) including, but not limited to diagnosis and management of acute and chronic medical conditions.   Faythe Casa,  NP 05/18/2019 2:49 PM

## 2019-05-19 ENCOUNTER — Ambulatory Visit: Payer: Medicare HMO

## 2019-05-19 ENCOUNTER — Other Ambulatory Visit: Payer: Self-pay

## 2019-05-19 DIAGNOSIS — C182 Malignant neoplasm of ascending colon: Secondary | ICD-10-CM | POA: Diagnosis not present

## 2019-05-19 DIAGNOSIS — Z800 Family history of malignant neoplasm of digestive organs: Secondary | ICD-10-CM | POA: Diagnosis not present

## 2019-05-19 DIAGNOSIS — Z803 Family history of malignant neoplasm of breast: Secondary | ICD-10-CM | POA: Diagnosis not present

## 2019-05-19 DIAGNOSIS — K5900 Constipation, unspecified: Secondary | ICD-10-CM | POA: Diagnosis not present

## 2019-05-19 DIAGNOSIS — R197 Diarrhea, unspecified: Secondary | ICD-10-CM

## 2019-05-19 DIAGNOSIS — Z9221 Personal history of antineoplastic chemotherapy: Secondary | ICD-10-CM | POA: Diagnosis not present

## 2019-05-19 LAB — C DIFFICILE QUICK SCREEN W PCR REFLEX
C Diff antigen: NEGATIVE
C Diff interpretation: NOT DETECTED
C Diff toxin: NEGATIVE

## 2019-05-19 LAB — GASTROINTESTINAL PANEL BY PCR, STOOL (REPLACES STOOL CULTURE)

## 2019-05-20 ENCOUNTER — Telehealth: Payer: Self-pay | Admitting: *Deleted

## 2019-05-20 ENCOUNTER — Ambulatory Visit: Payer: Medicare HMO

## 2019-05-20 ENCOUNTER — Other Ambulatory Visit: Payer: Self-pay | Admitting: Oncology

## 2019-05-20 ENCOUNTER — Inpatient Hospital Stay: Payer: Medicare HMO

## 2019-05-20 MED ORDER — DIPHENOXYLATE-ATROPINE 2.5-0.025 MG PO TABS: 1.0000 | ORAL_TABLET | Freq: Four times a day (QID) | ORAL | 0 refills | Status: DC | PRN

## 2019-05-20 NOTE — Telephone Encounter (Signed)
We will call her. Thanks.   Faythe Casa, NP 05/20/2019 12:00 PM

## 2019-05-20 NOTE — Telephone Encounter (Signed)
Spoke to patient via telephone about her diarrhea, which is better today. She requested that we hold her radiation treatments until Monday. Spoke with Casper Harrison, RN in radiation and was told that it would be ok to hold until Monday. Patient expressed understanding of this. She will return to clinic on Monday to resume radiation treatments.   dhs

## 2019-05-20 NOTE — Telephone Encounter (Signed)
Patient called reporting that she continues to have diarrhea and that she is supposed to have her treatment tomorrow and is wondering if she should restart her treatments or not. Please return her call [phone]

## 2019-05-20 NOTE — Telephone Encounter (Signed)
-----   Message from Jacquelin Hawking, NP sent at 05/20/2019  8:48 AM EST ----- Re: Results  Negative GI panel and c-diff.   Okay to take imodium or lomotil.   Paislea Hatton, can you call patient and let her know it's okay to take imodium/lomotil for diarrhea?   This is likely radiation induced. Treatment is symptomatic. If she needs IV fluids and electrolytes we can certainly see her back.   Thanks,   Rulon Abide

## 2019-05-20 NOTE — Telephone Encounter (Signed)
Sonia Baller,  I am forwarding this to Dr.Chrystal for now.   I am not actively involved in her care as I have not seen her since her discharge. She has not made an appointment with me yet- not sure if she would like to or not.   Talbert Cage

## 2019-05-20 NOTE — Telephone Encounter (Signed)
-----   Message from Jacquelin Hawking, NP sent at 05/20/2019  8:48 AM EST ----- Re: Results  Negative GI panel and c-diff.   Okay to take imodium or lomotil.   Shani Fitch, can you call patient and let her know it's okay to take imodium/lomotil for diarrhea?   This is likely radiation induced. Treatment is symptomatic. If she needs IV fluids and electrolytes we can certainly see her back.   Thanks,   Rulon Abide

## 2019-05-20 NOTE — Telephone Encounter (Signed)
Patient was also told that it is ok to take imodium as needed for diarrhea stools, as her stool studies were negative. Patient verbalized understanding.    dhs

## 2019-05-20 NOTE — Progress Notes (Signed)
Re: Results  Negative GI panel and c-diff.   Okay to take imodium or lomotil.   Doni, can you call patient and let her know it's okay to take imodium/lomotil for diarrhea?   This is likely radiation induced. Treatment is symptomatic. If she needs IV fluids and electrolytes we can certainly see her back.   Thanks,   Rulon Abide

## 2019-05-21 ENCOUNTER — Ambulatory Visit: Payer: Medicare HMO

## 2019-05-21 NOTE — Telephone Encounter (Signed)
Thanks so much! I can reach out to her to see what her plans are. Thanks.   Faythe Casa, NP 05/21/2019 11:03 AM

## 2019-05-22 ENCOUNTER — Ambulatory Visit: Payer: Medicare HMO

## 2019-05-25 ENCOUNTER — Other Ambulatory Visit: Payer: Self-pay

## 2019-05-25 ENCOUNTER — Ambulatory Visit: Payer: Medicare HMO

## 2019-05-25 ENCOUNTER — Ambulatory Visit
Admission: RE | Admit: 2019-05-25 | Discharge: 2019-05-25 | Disposition: A | Discharge status: Home / Self Care | Payer: Medicare HMO | Source: Ambulatory Visit | Attending: Radiation Oncology | Admitting: Radiation Oncology

## 2019-05-25 DIAGNOSIS — Z510 Encounter for antineoplastic radiation therapy: Secondary | ICD-10-CM | POA: Diagnosis not present

## 2019-05-25 DIAGNOSIS — C182 Malignant neoplasm of ascending colon: Secondary | ICD-10-CM | POA: Diagnosis not present

## 2019-05-25 DIAGNOSIS — C779 Secondary and unspecified malignant neoplasm of lymph node, unspecified: Secondary | ICD-10-CM | POA: Diagnosis not present

## 2019-05-26 ENCOUNTER — Ambulatory Visit
Admission: RE | Admit: 2019-05-26 | Discharge: 2019-05-26 | Disposition: A | Discharge status: Home / Self Care | Payer: Medicare HMO | Source: Ambulatory Visit | Attending: Radiation Oncology | Admitting: Radiation Oncology

## 2019-05-26 ENCOUNTER — Other Ambulatory Visit: Payer: Self-pay

## 2019-05-26 ENCOUNTER — Ambulatory Visit: Payer: Medicare HMO

## 2019-05-26 DIAGNOSIS — Z510 Encounter for antineoplastic radiation therapy: Secondary | ICD-10-CM | POA: Diagnosis not present

## 2019-05-26 DIAGNOSIS — C779 Secondary and unspecified malignant neoplasm of lymph node, unspecified: Secondary | ICD-10-CM | POA: Diagnosis not present

## 2019-05-26 DIAGNOSIS — C182 Malignant neoplasm of ascending colon: Secondary | ICD-10-CM | POA: Diagnosis not present

## 2019-05-27 ENCOUNTER — Other Ambulatory Visit: Payer: Self-pay

## 2019-05-27 ENCOUNTER — Ambulatory Visit
Admission: RE | Admit: 2019-05-27 | Discharge: 2019-05-27 | Disposition: A | Discharge status: Home / Self Care | Payer: Medicare HMO | Source: Ambulatory Visit | Attending: Radiation Oncology | Admitting: Radiation Oncology

## 2019-05-27 DIAGNOSIS — C182 Malignant neoplasm of ascending colon: Secondary | ICD-10-CM | POA: Diagnosis not present

## 2019-05-27 DIAGNOSIS — C779 Secondary and unspecified malignant neoplasm of lymph node, unspecified: Secondary | ICD-10-CM | POA: Diagnosis not present

## 2019-05-27 DIAGNOSIS — Z510 Encounter for antineoplastic radiation therapy: Secondary | ICD-10-CM | POA: Diagnosis not present

## 2019-05-28 ENCOUNTER — Other Ambulatory Visit: Payer: Self-pay

## 2019-05-28 ENCOUNTER — Ambulatory Visit
Admission: RE | Admit: 2019-05-28 | Discharge: 2019-05-28 | Disposition: A | Discharge status: Home / Self Care | Payer: Medicare HMO | Source: Ambulatory Visit | Attending: Radiation Oncology | Admitting: Radiation Oncology

## 2019-05-28 ENCOUNTER — Ambulatory Visit: Payer: Medicare HMO

## 2019-05-28 DIAGNOSIS — C182 Malignant neoplasm of ascending colon: Secondary | ICD-10-CM | POA: Diagnosis not present

## 2019-05-28 DIAGNOSIS — Z510 Encounter for antineoplastic radiation therapy: Secondary | ICD-10-CM | POA: Diagnosis not present

## 2019-05-28 DIAGNOSIS — C779 Secondary and unspecified malignant neoplasm of lymph node, unspecified: Secondary | ICD-10-CM | POA: Diagnosis not present

## 2019-05-29 ENCOUNTER — Other Ambulatory Visit: Payer: Self-pay

## 2019-05-29 ENCOUNTER — Ambulatory Visit
Admission: RE | Admit: 2019-05-29 | Discharge: 2019-05-29 | Disposition: A | Discharge status: Home / Self Care | Payer: Medicare HMO | Source: Ambulatory Visit | Attending: Radiation Oncology | Admitting: Radiation Oncology

## 2019-05-29 DIAGNOSIS — C182 Malignant neoplasm of ascending colon: Secondary | ICD-10-CM | POA: Diagnosis not present

## 2019-05-29 DIAGNOSIS — C779 Secondary and unspecified malignant neoplasm of lymph node, unspecified: Secondary | ICD-10-CM | POA: Diagnosis not present

## 2019-05-29 DIAGNOSIS — Z510 Encounter for antineoplastic radiation therapy: Secondary | ICD-10-CM | POA: Diagnosis not present

## 2019-06-01 ENCOUNTER — Other Ambulatory Visit: Payer: Self-pay

## 2019-06-01 ENCOUNTER — Other Ambulatory Visit: Payer: Medicare HMO

## 2019-06-01 ENCOUNTER — Ambulatory Visit
Admission: RE | Admit: 2019-06-01 | Discharge: 2019-06-01 | Disposition: A | Discharge status: Home / Self Care | Payer: Medicare HMO | Source: Ambulatory Visit | Attending: Radiation Oncology | Admitting: Radiation Oncology

## 2019-06-01 DIAGNOSIS — C779 Secondary and unspecified malignant neoplasm of lymph node, unspecified: Secondary | ICD-10-CM | POA: Diagnosis not present

## 2019-06-01 DIAGNOSIS — C182 Malignant neoplasm of ascending colon: Secondary | ICD-10-CM | POA: Diagnosis not present

## 2019-06-01 DIAGNOSIS — Z510 Encounter for antineoplastic radiation therapy: Secondary | ICD-10-CM | POA: Diagnosis not present

## 2019-07-06 ENCOUNTER — Ambulatory Visit
Admission: RE | Admit: 2019-07-06 | Discharge: 2019-07-06 | Disposition: A | Discharge status: Home / Self Care | Payer: Medicare HMO | Source: Ambulatory Visit | Attending: Radiation Oncology | Admitting: Radiation Oncology

## 2019-07-06 ENCOUNTER — Other Ambulatory Visit: Payer: Self-pay

## 2019-07-06 VITALS — BP 161/83 | HR 86 | Temp 97.0°F | Resp 16 | Wt 127.6 lb

## 2019-07-06 DIAGNOSIS — C182 Malignant neoplasm of ascending colon: Secondary | ICD-10-CM | POA: Insufficient documentation

## 2019-07-06 DIAGNOSIS — Z923 Personal history of irradiation: Secondary | ICD-10-CM | POA: Insufficient documentation

## 2019-07-06 NOTE — Progress Notes (Signed)
Radiation Oncology Follow up Note  Name: Mary Beltran   Date:   07/06/2019 MRN:  [id number] DOB: 1940-06-07    This 79 y.o. female presents to the clinic today for 1 month follow-up status post external beam radiation therapy to region of the cecum for stage III (T4 N1 M0) adenocarcinoma status post right colectomy and adjuvant chemotherapy.  REFERRING PROVIDER: [name], MD  HPI: Patient is a 79 year old female now at 1 month having completed external beam radiation therapy to the region of tumor involvement of her stage III (T4 N1 M0) adenocarcinoma status post adjuvant chemotherapy and right colectomy.  Seen today in routine follow-up she is doing well she does occasionally have mild intermittent diarrhea.  Does occasionally take Imodium.  Does not follow rules low residue diet..  She specifically denies abdominal pain  COMPLICATIONS OF TREATMENT: none  FOLLOW UP COMPLIANCE: keeps appointments   PHYSICAL EXAM:  BP (!) 161/83   Pulse 86   Temp (!) 97 F (36.1 C)   Resp 16   Wt 127 lb 9.6 oz (57.9 kg)   SpO2 98%   BMI 19.98 kg/m  Well-developed well-nourished patient in NAD. HEENT reveals PERLA, EOMI, discs not visualized.  Oral cavity is clear. No oral mucosal lesions are identified. Neck is clear without evidence of cervical or supraclavicular adenopathy. Lungs are clear to A&P. Cardiac examination is essentially unremarkable with regular rate and rhythm without murmur rub or thrill. Abdomen is benign with no organomegaly or masses noted. Motor sensory and DTR levels are equal and symmetric in the upper and lower extremities. Cranial nerves II through XII are grossly intact. Proprioception is intact. No peripheral adenopathy or edema is identified. No motor or sensory levels are noted. Crude visual fields are within normal range.  RADIOLOGY RESULTS: No current films reviewed CT scan will be ordered in approximately 3 months  PLAN: Present time patient is doing well low  side effect profile from external beam radiation therapy.  I have asked to see her back in 3 to 4 months with a CT scan prior to the next visit.  I also scheduled her a follow-up appointment with Dr. Tasia Catchings.  She may want to do CT scan sooner.  Patient knows to call with any concerns at any time.  I would like to take this opportunity to thank you for allowing me to participate in the care of your patient.Noreene Filbert, MD

## 2019-07-14 ENCOUNTER — Encounter: Payer: Self-pay | Admitting: Oncology

## 2019-07-14 NOTE — Progress Notes (Signed)
Patient contacted for follow up. Pt reports she has diarrhea on and off and is using imodium PRN. She was told by Dr.Chrystal that this would be pretty normal after radiation.

## 2019-07-15 ENCOUNTER — Inpatient Hospital Stay: Payer: Medicare HMO

## 2019-07-15 ENCOUNTER — Encounter: Payer: Self-pay | Admitting: Oncology

## 2019-07-15 ENCOUNTER — Other Ambulatory Visit: Payer: Self-pay

## 2019-07-15 ENCOUNTER — Inpatient Hospital Stay: Payer: Medicare HMO | Attending: Oncology | Admitting: Oncology

## 2019-07-15 VITALS — BP 153/78 | HR 85 | Temp 97.1°F | Resp 16 | Wt 129.9 lb

## 2019-07-15 DIAGNOSIS — R918 Other nonspecific abnormal finding of lung field: Secondary | ICD-10-CM | POA: Insufficient documentation

## 2019-07-15 DIAGNOSIS — R69 Illness, unspecified: Secondary | ICD-10-CM | POA: Diagnosis not present

## 2019-07-15 DIAGNOSIS — D509 Iron deficiency anemia, unspecified: Secondary | ICD-10-CM | POA: Insufficient documentation

## 2019-07-15 DIAGNOSIS — R197 Diarrhea, unspecified: Secondary | ICD-10-CM | POA: Insufficient documentation

## 2019-07-15 DIAGNOSIS — C182 Malignant neoplasm of ascending colon: Secondary | ICD-10-CM | POA: Diagnosis not present

## 2019-07-15 DIAGNOSIS — K5900 Constipation, unspecified: Secondary | ICD-10-CM | POA: Diagnosis not present

## 2019-07-15 DIAGNOSIS — Z95828 Presence of other vascular implants and grafts: Secondary | ICD-10-CM | POA: Diagnosis not present

## 2019-07-15 DIAGNOSIS — K219 Gastro-esophageal reflux disease without esophagitis: Secondary | ICD-10-CM | POA: Insufficient documentation

## 2019-07-15 DIAGNOSIS — F17210 Nicotine dependence, cigarettes, uncomplicated: Secondary | ICD-10-CM | POA: Insufficient documentation

## 2019-07-15 DIAGNOSIS — Z803 Family history of malignant neoplasm of breast: Secondary | ICD-10-CM | POA: Insufficient documentation

## 2019-07-15 DIAGNOSIS — J439 Emphysema, unspecified: Secondary | ICD-10-CM | POA: Diagnosis not present

## 2019-07-15 DIAGNOSIS — Z809 Family history of malignant neoplasm, unspecified: Secondary | ICD-10-CM | POA: Diagnosis not present

## 2019-07-15 DIAGNOSIS — R062 Wheezing: Secondary | ICD-10-CM | POA: Diagnosis not present

## 2019-07-15 DIAGNOSIS — Z720 Tobacco use: Secondary | ICD-10-CM | POA: Diagnosis not present

## 2019-07-15 DIAGNOSIS — K5909 Other constipation: Secondary | ICD-10-CM | POA: Diagnosis not present

## 2019-07-15 LAB — COMPREHENSIVE METABOLIC PANEL
ALT: 17 U/L (ref 0–44)
AST: 21 U/L (ref 15–41)
Albumin: 4.2 g/dL (ref 3.5–5.0)
Alkaline Phosphatase: 73 U/L (ref 38–126)
Anion gap: 9 (ref 5–15)
BUN: 11 mg/dL (ref 8–23)
CO2: 25 mmol/L (ref 22–32)
Calcium: 9.1 mg/dL (ref 8.9–10.3)
Chloride: 104 mmol/L (ref 98–111)
Creatinine, Ser: 0.63 mg/dL (ref 0.44–1.00)
GFR calc Af Amer: >60 mL/min (ref >60)
GFR calc non Af Amer: >60 mL/min (ref >60)
Glucose, Bld: 104 mg/dL — ABNORMAL HIGH (ref 70–99)
Potassium: 4.1 mmol/L (ref 3.5–5.1)
Sodium: 138 mmol/L (ref 135–145)
Total Bilirubin: 0.9 mg/dL (ref 0.3–1.2)
Total Protein: 7.1 g/dL (ref 6.5–8.1)

## 2019-07-15 LAB — CBC WITH DIFFERENTIAL/PLATELET
Abs Immature Granulocytes: 0.01 10*3/uL (ref 0.00–0.07)
Basophils Absolute: 0.1 10*3/uL (ref 0.0–0.1)
Basophils Relative: 1 %
Eosinophils Absolute: 0.1 10*3/uL (ref 0.0–0.5)
Eosinophils Relative: 3 %
HCT: 40.4 % (ref 36.0–46.0)
Hemoglobin: 13.4 g/dL (ref 12.0–15.0)
Immature Granulocytes: 0 %
Lymphocytes Relative: 16 %
Lymphs Abs: 0.8 10*3/uL (ref 0.7–4.0)
MCH: 31.3 pg (ref 26.0–34.0)
MCHC: 33.2 g/dL (ref 30.0–36.0)
MCV: 94.4 fL (ref 80.0–100.0)
Monocytes Absolute: 0.5 10*3/uL (ref 0.1–1.0)
Monocytes Relative: 10 %
Neutro Abs: 3.4 10*3/uL (ref 1.7–7.7)
Neutrophils Relative %: 70 %
Platelets: 221 10*3/uL (ref 150–400)
RBC: 4.28 MIL/uL (ref 3.87–5.11)
RDW: 13.2 % (ref 11.5–15.5)
WBC: 4.8 10*3/uL (ref 4.0–10.5)
nRBC: 0 % (ref 0.0–0.2)

## 2019-07-15 NOTE — Progress Notes (Signed)
Hematology/Oncology  [place] Telephone:([phone] [phone]   Patient Care Team: [name], MD as PCP - General (Internal Medicine) Clent Jacks, RN as Oncology Nurse Navigator Noreene Filbert, MD as Radiation Oncologist (Radiation Oncology)  REFERRING PROVIDER: [name], MD  CHIEF COMPLAINTS/REASON FOR VISIT:  Follow-up for management of stage III colon cancer,  iron deficiency, lung nodules HISTORY OF PRESENTING ILLNESS:   Mary Beltran is a  79 y.o.  female with PMH listed below was seen in consultation at the request of  [name], MD  for evaluation of colon cancer Patient had acute cholecystitis and had laparoscopic cholecystectomy same day by Dr. Peyton Najjar. Patient was found to have iron deficiency anemia with hemoglobin around 9.2, ferritin 7 and iron 24. Baseline hemoglobin 1 year ago was 13.1.  Patient was started on iron supplementation with Ferrex 150 mg iron capsule daily. Reports to have chronic constipation.  Denies any blood in the stool. Patient was seen by gastroenterology and had endoscopy and colonoscopy done on 10/06/2018. There was a partially obstructing mass in the cecum which was biopsied. 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 Gastric mucosa shows reactive foveolar hyperplasia, stromal fibrosis and focal chronic inflammation.  Negative for H. pylori.  Duodenal biopsy shows no abnormalities.  No evidence of celiac disease.  Sigmoid polyps showed tubular adenoma. 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 10 pound weight loss within past few months. Denies any family history colon cancer.  Paternal grandmother passed away due to breast cancer. Per patient's request, I called patient's son [name] and sister [name], and put both of them on speaker phone so they can hear the conversation and participate in discussion.  # 10/23/2018, she  underwent right hemicolectomy Pathology showed invasive moderately differentiated adenocarcinoma involving cecum and adjacent terminal ileum with extension into pericecal adipose tissue and focally to inked serosal surface. Invasive moderately differentiated adenocarcinoma involving proximal ascending colon and ileocecal valve with extension into pericolonic adipose tissue. Tubular adenoma, sessile serrated polyp in the cecum, tubular adenoma sessile serrated polyp in the ascending colon.  Grade 2, tumor invades visceral peritoneum, all margins are negative. Pathological staging pT4a pN1c  #Iron deficiency anemia, status post IV Venofer treatments.   INTERVAL HISTORY Mary Beltran is a 80 y.o. female who has above history reviewed by me today presents for follow up visit for management of stage III right colon cancer, iron deficiency, lung nodules Status post 5 cycles of adjuvant 5-FU treatments. Patient was admitted from 03/11/2019-03/13/2019 due to chemotherapy complication including intractable nausea/vomiting/diarrhea.  She also developed ileitis.  She was given supportive care during the hospitalization. After hospitalization, she declines further chemotherapy treatments.   Patient/family had some concerns about continuing her care with me..  Patient was given options of being referred to tertiary center versus continue care with me.  She did not make appointment with me since hospitalization.  I have referred her to establish care with radiation oncology. 06/01/2019 patient finished adjuvant radiation.  Dr. Baruch Gouty referred patient's back to me.  Today patient reports no new complaints.  Occasionally she has diarrhea. Denies any nausea, vomiting, abdominal pain, unintentional weight loss.  Appetite is fair.    Review of Systems  Constitutional: Positive for fatigue. Negative for appetite change, chills and fever.  HENT:   Negative for hearing loss and voice change.   Eyes: Negative for eye  problems.  Respiratory: Negative for chest tightness and cough.   Cardiovascular: Negative for  chest pain.  Gastrointestinal: Negative for abdominal distention, abdominal pain, blood in stool, constipation and diarrhea.  Endocrine: Negative for hot flashes.  Genitourinary: Negative for difficulty urinating and frequency.   Musculoskeletal: Negative for arthralgias.  Skin: Negative for itching and rash.  Neurological: Negative for extremity weakness.  Hematological: Negative for adenopathy.  Psychiatric/Behavioral: Negative for confusion.    MEDICAL HISTORY:  Past Medical History:  Diagnosis Date  . Cancer ([place])   . Complication of anesthesia    NOT WITH GB  . GERD (gastroesophageal reflux disease)   . IDA (iron deficiency anemia) 10/13/2018  . PONV (postoperative nausea and vomiting)     SURGICAL HISTORY: Past Surgical History:  Procedure Laterality Date  . ABDOMINAL HYSTERECTOMY     complete  . APPENDECTOMY    . CHOLECYSTECTOMY N/A 08/22/2018   Procedure: LAPAROSCOPIC CHOLECYSTECTOMY;  Surgeon: Herbert Pun, MD;  Location: [facility] ORS;  Service: General;  Laterality: N/A;  . COLONOSCOPY    . LAPAROSCOPIC RIGHT COLECTOMY N/A 10/23/2018   Procedure: LAPAROSCOPIC HAND ASSISTED RIGHT COLECTOMY;  Surgeon: Herbert Pun, MD;  Location: [facility] ORS;  Service: General;  Laterality: N/A;  . PERCUTANEOUS PINNING WRIST FRACTURE Right   . PORTACATH PLACEMENT Right 12/08/2018   Procedure: INSERTION PORT-A-CATH, CENTRAL VENOUS CATH WITH SUBCUTANEOUS INFUSION PORT;  Surgeon: Herbert Pun, MD;  Location: [facility] ORS;  Service: General;  Laterality: Right;  . WRIST FOREIGN BODY REMOVAL Right    2016    SOCIAL HISTORY: Social History   Socioeconomic History  . Marital status: Divorced    Spouse name: Not on file  . Number of children: Not on file  . Years of education: Not on file  . Highest education level: Not on file  Occupational History  . Occupation: retired    Tobacco Use  . Smoking status: Current Every Day Smoker    Packs/day: 0.25    Years: 60.00    Pack years: 15.00    Types: Cigarettes  . Smokeless tobacco: Never Used  . Tobacco comment: Patient states that this is the only thing she enjoys  Substance and Sexual Activity  . Alcohol use: No  . Drug use: Not Currently  . Sexual activity: Not Currently  Other Topics Concern  . Not on file  Social History Narrative  . Not on file   Social Determinants of Health   Financial Resource Strain:   . Difficulty of Paying Living Expenses:   Food Insecurity:   . Worried About Charity fundraiser in the Last Year:   . Arboriculturist in the Last Year:   Transportation Needs:   . Film/video editor (Medical):   Marland Kitchen Lack of Transportation (Non-Medical):   Physical Activity:   . Days of Exercise per Week:   . Minutes of Exercise per Session:   Stress:   . Feeling of Stress :   Social Connections:   . Frequency of Communication with Friends and Family:   . Frequency of Social Gatherings with Friends and Family:   . Attends Religious Services:   . Active Member of Clubs or Organizations:   . Attends Archivist Meetings:   Marland Kitchen Marital Status:   Intimate Partner Violence:   . Fear of Current or Ex-Partner:   . Emotionally Abused:   Marland Kitchen Physically Abused:   . Sexually Abused:     FAMILY HISTORY: Family History  Problem Relation Age of Onset  . COPD Mother   . Suicidality Father   . Breast cancer  Paternal Grandmother   . Failure to thrive Sister     ALLERGIES:  is allergic to pain & fever [acetaminophen]; sulfa antibiotics; ciprofloxacin; codeine; and penicillins.  MEDICATIONS:  Current Outpatient Medications  Medication Sig Dispense Refill  . loperamide (IMODIUM) 2 MG capsule Take 1 capsule (2 mg total) by mouth as needed for diarrhea or loose stools. 30 capsule 0  . bismuth subsalicylate (PEPTO BISMOL) 262 MG/15ML suspension Take 30 mLs by mouth every 6 (six) hours as  needed.    . calcium carbonate (TUMS - DOSED IN MG ELEMENTAL CALCIUM) 500 MG chewable tablet Chew 1 tablet by mouth daily.    . diphenhydrAMINE (BENADRYL) 25 mg capsule Take 1 capsule (25 mg total) by mouth every 8 (eight) hours as needed for itching. (Patient not taking: Reported on 07/06/2019) 30 capsule 0  . diphenoxylate-atropine (LOMOTIL) 2.5-0.025 MG tablet Take 1 tablet by mouth 4 (four) times daily as needed for diarrhea or loose stools. (Patient not taking: Reported on 07/06/2019) 30 tablet 0  . hydrocortisone-pramoxine (PROCTOFOAM-HC) rectal foam Place 1 applicator rectally 2 (two) times daily. (Patient not taking: Reported on 07/06/2019) 10 g 2  . Multiple Vitamins-Minerals (MULTIVITAMIN WITH MINERALS) tablet Take 1 tablet by mouth daily.     No current facility-administered medications for this visit.     PHYSICAL EXAMINATION: ECOG PERFORMANCE STATUS: 1 - Symptomatic but completely ambulatory Physical Exam Constitutional:      General: She is not in acute distress.    Comments: Thin, walk in   HENT:     Head: Normocephalic and atraumatic.  Eyes:     General: No scleral icterus.    Pupils: Pupils are equal, round, and reactive to light.  Cardiovascular:     Rate and Rhythm: Normal rate and regular rhythm.     Heart sounds: Normal heart sounds.  Pulmonary:     Effort: Pulmonary effort is normal. No respiratory distress.     Breath sounds: Wheezing present.  Abdominal:     General: Bowel sounds are normal. There is no distension.     Palpations: Abdomen is soft. There is no mass.     Tenderness: There is no abdominal tenderness.  Musculoskeletal:        General: No deformity. Normal range of motion.     Cervical back: Normal range of motion and neck supple.  Skin:    General: Skin is warm and dry.     Coloration: Skin is not pale.     Findings: No erythema or rash.  Neurological:     Mental Status: She is alert and oriented to person, place, and time.     Cranial  Nerves: No cranial nerve deficit.     Coordination: Coordination normal.  Psychiatric:        Mood and Affect: Mood normal.     LABORATORY DATA:  I have reviewed the data as listed Lab Results  Component Value Date   WBC 6.5 05/18/2019   HGB 13.4 05/18/2019   HCT 42.9 05/18/2019   MCV 100.9 (H) 05/18/2019   PLT 184 05/18/2019   Recent Labs    03/12/19 0416 03/13/19 0524 03/13/19 1535 05/06/19 1330 05/18/19 1058  NA 133*   < > 132* 137 135  K 3.0*   < > 4.4 4.3 4.3  CL 107   < > 107 106 101  CO2 18*   < > 20* 25 24  GLUCOSE 101*   < > 116* 94 98  BUN 13   < >  6* 8 9  CREATININE 0.64   < > 0.54 0.49 0.55  CALCIUM 7.8*   < > 7.5* 9.4 10.1  GFRNONAA >60   < > >60 >60 >60  GFRAA >60   < > >60 >60 >60  PROT 4.8*  --   --  7.3 7.3  ALBUMIN 2.4*  --   --  4.2 4.3  AST 13*  --   --  21 17  ALT 13  --   --  18 14  ALKPHOS 56  --   --  69 71  BILITOT 1.0  --   --  0.7 0.5   < > = values in this interval not displayed.   Iron/TIBC/Ferritin/ %Sat    Component Value Date/Time   IRON 20 (L) 10/13/2018 1154   TIBC 547 (H) 10/13/2018 1154   FERRITIN 6 (L) 10/13/2018 1154   IRONPCTSAT 4 (L) 10/13/2018 1154      RADIOGRAPHIC STUDIES: I have personally reviewed the radiological images as listed and agreed with the findings in the report. No results found.    ASSESSMENT & PLAN:  1. Cancer of right colon ([place])   2. Port-A-Cath in place   3. Family history of cancer   4. Wheezing   5. Tobacco abuse   Cancer Staging Cancer of right colon [facility]) Staging form: Colon and Rectum, AJCC 8th Edition - Pathologic stage from 10/31/2018: Stage IIIB (pT4a, pN1c, cM0) - Signed by [name], MD on 10/31/2018   #Stage IIIB right colon adenocarcinoma She finished 5 cycles of adjuvant 5-FU.  Due to chemotherapy complication, she declines further chemotherapy. Finished adjuvant radiation. Discussed with the patient about surveillance plan. We discussed about NCCN guideline recommended  surveillance plan after she finish adjuvant chemotherapy. Recommend history and physical every 3-6 months for the first 2 years, then every 6 months for total of 5 years. Will obtain CEA every 3 to 6 months for 2-year and then every 6 months for total of 5 years. We will obtain CT chest abdomen pelvis every 6-12 months for total of 5 years. Recommend patient to repeat colonoscopy in 1 year after surgery-patient to contact GI to arrange follow-up appointments.  #Intermittent diarrhea, per patient symptoms has improved.  Continue Imodium as instructed.  Discussed with patient that the diarrhea is most likely secondary to radiation side effects.Marland Kitchen #Wheezing, emphysema, recommend patient follow-up with primary care provider for discussion for treatments.  Smoke cessation. #Family history of cancer, I discussed with her again about genetic testing.  She declined. #Port-A-Cath in place, continue port flush every 6 to 8 weeks.  Recommend patient to keep port for at least 1 year after surgery.  Obtain CBC, CMP, CEA today.  I will obtain CT chest abdomen pelvis with contrast in 3 months. She will follow-up in the clinic after CT scan for MD assessment and labs.  [name], MD, PhD Hematology Oncology [place] [facility] 07/15/2019

## 2019-07-16 LAB — CEA: CEA: 3.5 ng/mL (ref 0.0–4.7)

## 2019-08-27 ENCOUNTER — Telehealth: Payer: Self-pay | Admitting: Oncology

## 2019-08-27 ENCOUNTER — Other Ambulatory Visit: Payer: Self-pay | Admitting: Oncology

## 2019-08-27 NOTE — Telephone Encounter (Signed)
Done.Marland Kitchen...   lab/MD and CT scan appts has been cx per pt request on 08/27/19.

## 2019-08-27 NOTE — Telephone Encounter (Signed)
MD notified of patient's decision. Letter has been faxed to Dr. Ouida Sills advising for him to follow up on patient (per pt request) and do surveillance scans.

## 2019-08-27 NOTE — Telephone Encounter (Signed)
Patient phoned on this date and stated that she feels that she is doing well and does not need to continue to be seen here. She stated that she will continue to see her PCP but wants her appts at the [place] to be cancelled. Message sent to MD team informing them of the above.

## 2019-09-29 DIAGNOSIS — R062 Wheezing: Secondary | ICD-10-CM | POA: Diagnosis not present

## 2019-09-29 DIAGNOSIS — J069 Acute upper respiratory infection, unspecified: Secondary | ICD-10-CM | POA: Diagnosis not present

## 2019-09-29 DIAGNOSIS — Z20822 Contact with and (suspected) exposure to covid-19: Secondary | ICD-10-CM | POA: Diagnosis not present

## 2019-09-29 DIAGNOSIS — J90 Pleural effusion, not elsewhere classified: Secondary | ICD-10-CM | POA: Diagnosis not present

## 2019-09-29 DIAGNOSIS — R69 Illness, unspecified: Secondary | ICD-10-CM | POA: Diagnosis not present

## 2019-09-29 DIAGNOSIS — L853 Xerosis cutis: Secondary | ICD-10-CM | POA: Diagnosis not present

## 2019-10-16 ENCOUNTER — Other Ambulatory Visit: Payer: Medicare HMO

## 2019-10-16 DIAGNOSIS — E43 Unspecified severe protein-calorie malnutrition: Secondary | ICD-10-CM | POA: Diagnosis not present

## 2019-10-16 DIAGNOSIS — Z0000 Encounter for general adult medical examination without abnormal findings: Secondary | ICD-10-CM | POA: Diagnosis not present

## 2019-10-16 DIAGNOSIS — R69 Illness, unspecified: Secondary | ICD-10-CM | POA: Diagnosis not present

## 2019-10-16 DIAGNOSIS — Z85038 Personal history of other malignant neoplasm of large intestine: Secondary | ICD-10-CM | POA: Diagnosis not present

## 2019-10-20 ENCOUNTER — Ambulatory Visit: Payer: Medicare HMO

## 2019-10-21 ENCOUNTER — Ambulatory Visit: Payer: Medicare HMO | Admitting: Radiation Oncology

## 2019-10-22 ENCOUNTER — Other Ambulatory Visit: Payer: Medicare HMO

## 2019-10-22 ENCOUNTER — Ambulatory Visit: Payer: Medicare HMO | Admitting: Oncology

## 2020-01-07 DIAGNOSIS — Z8619 Personal history of other infectious and parasitic diseases: Secondary | ICD-10-CM | POA: Diagnosis not present

## 2020-01-07 DIAGNOSIS — Z85828 Personal history of other malignant neoplasm of skin: Secondary | ICD-10-CM | POA: Diagnosis not present

## 2020-01-07 DIAGNOSIS — L814 Other melanin hyperpigmentation: Secondary | ICD-10-CM | POA: Diagnosis not present

## 2020-01-07 DIAGNOSIS — L821 Other seborrheic keratosis: Secondary | ICD-10-CM | POA: Diagnosis not present

## 2020-02-29 DIAGNOSIS — H524 Presbyopia: Secondary | ICD-10-CM | POA: Diagnosis not present

## 2020-02-29 DIAGNOSIS — H2513 Age-related nuclear cataract, bilateral: Secondary | ICD-10-CM | POA: Diagnosis not present

## 2020-03-01 DIAGNOSIS — Z0100 Encounter for examination of eyes and vision without abnormal findings: Secondary | ICD-10-CM | POA: Diagnosis not present

## 2020-03-18 DIAGNOSIS — R5383 Other fatigue: Secondary | ICD-10-CM | POA: Diagnosis not present

## 2020-11-23 IMAGING — CT CT ABDOMEN AND PELVIS WITH CONTRAST
2 of 5 series · 13 of 46 positions shown, 15 images · IV contrast (omnipaque)
Comparison: 04/23/2015

CLINICAL DATA: Possible colon mass seen on colonoscopy 4 days ago.

EXAM:
CT CHEST, ABDOMEN, AND PELVIS WITH CONTRAST
TECHNIQUE: Multidetector CT imaging of the chest, abdomen and pelvis was
performed following the standard protocol during bolus
administration of intravenous contrast.
CONTRAST:  100mL OMNIPAQUE IOHEXOL 300 MG/ML  SOLN

[Series 3: cap with · axial · 0.65mm/px · z∈[-453,+67]mm · 10 of 128 slices shown, 12 images]
[im 12/128  soft-tissue]
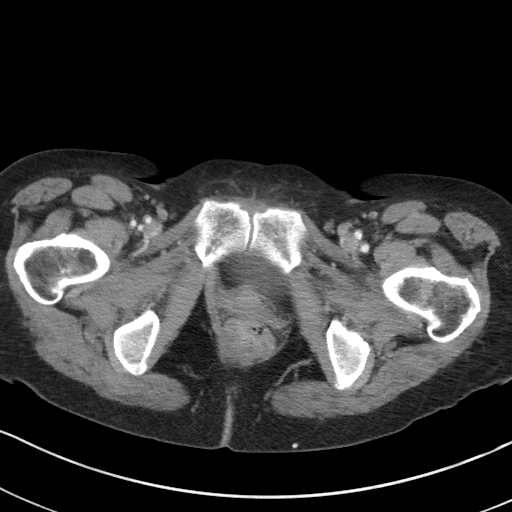
[im 12/128  bone]
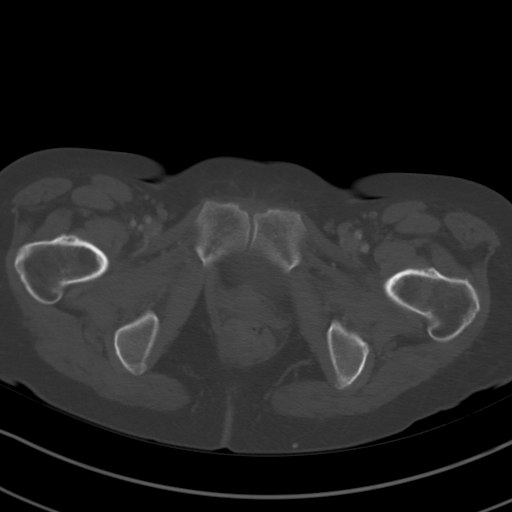
[im 24/128  soft-tissue]
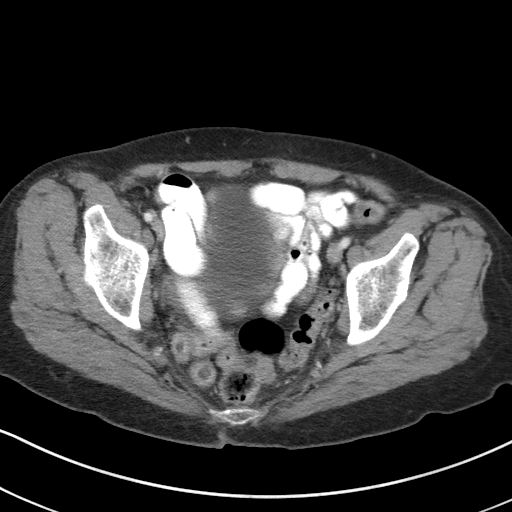
[im 35/128  soft-tissue]
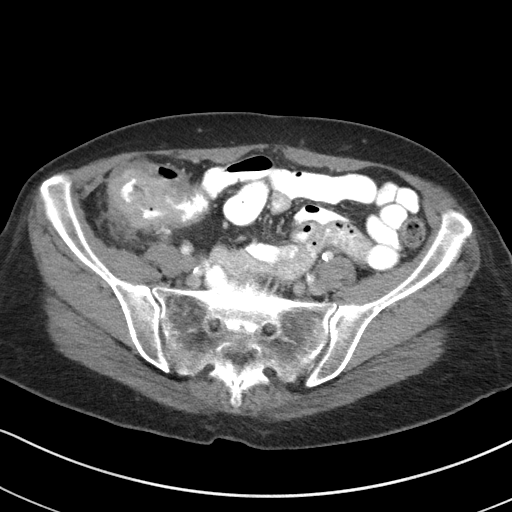
[im 47/128  soft-tissue]
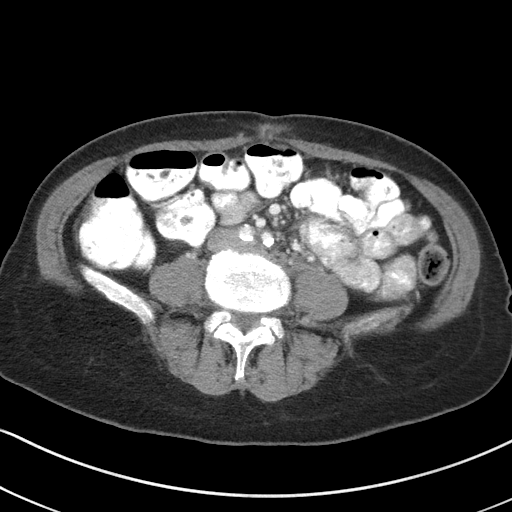
[im 58/128  soft-tissue]
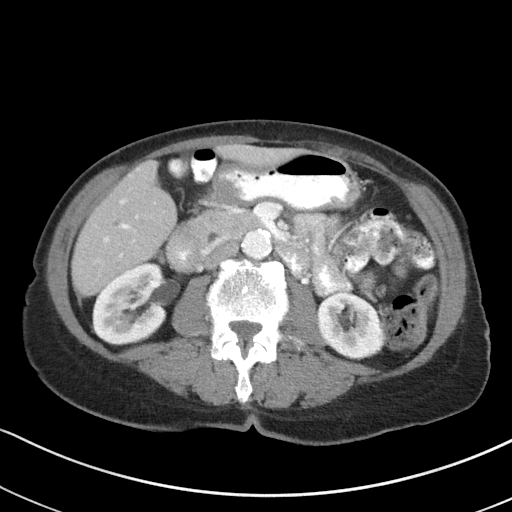
[im 70/128  soft-tissue]
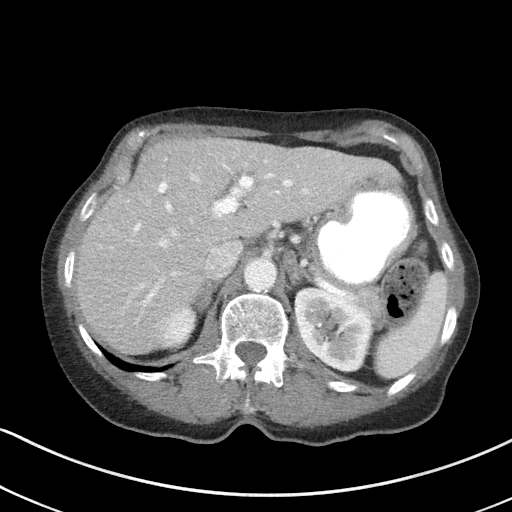
[im 81/128  soft-tissue]
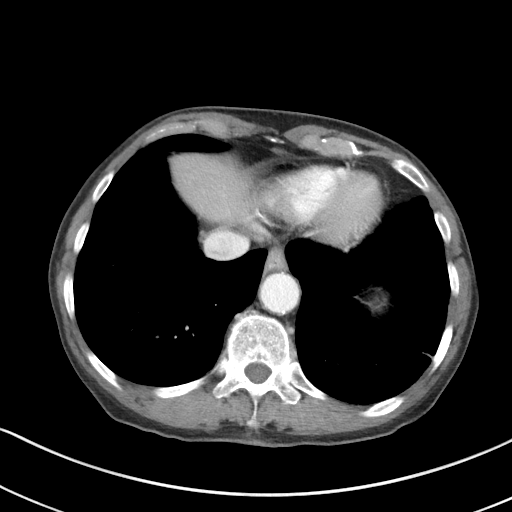
[im 93/128  soft-tissue]
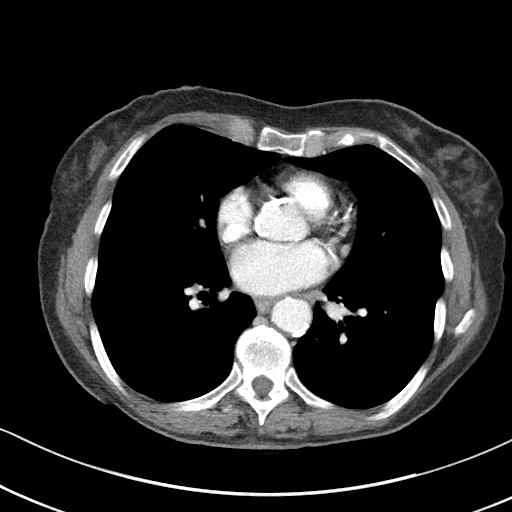
[im 104/128  soft-tissue]
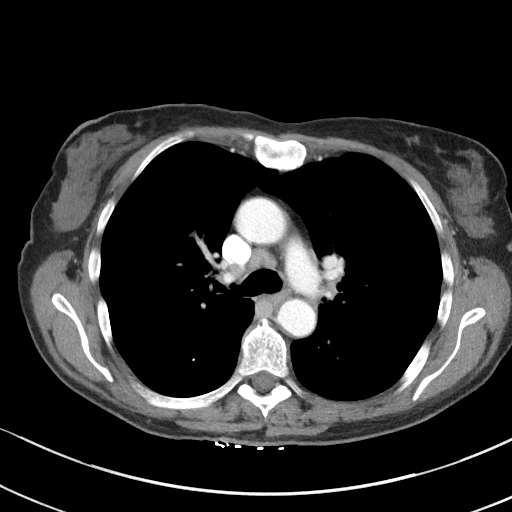
[im 104/128  bone]
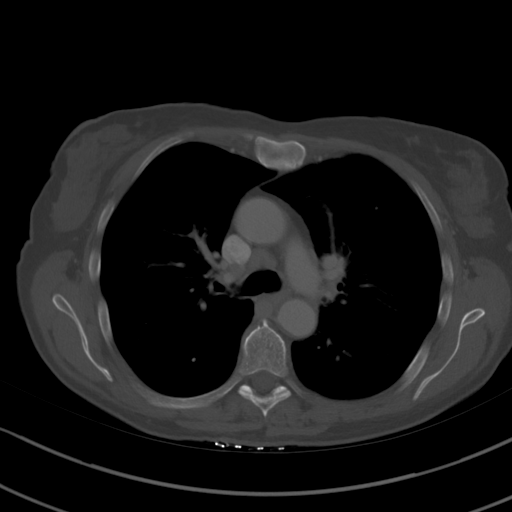
[im 116/128  soft-tissue]
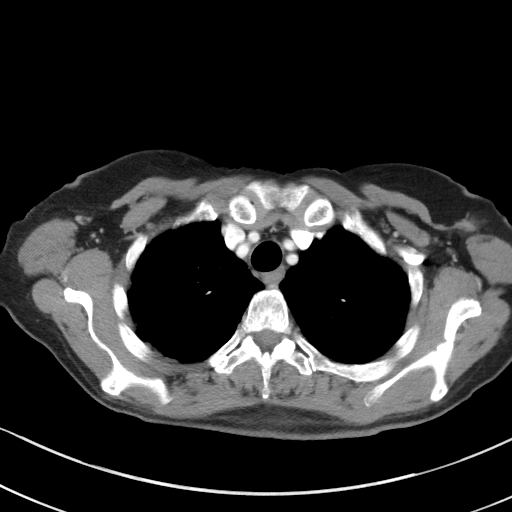

[Series 6: coronals · coronal · 0.78mm/px · 3 of 118 slices shown]
[im 40/118  soft-tissue]
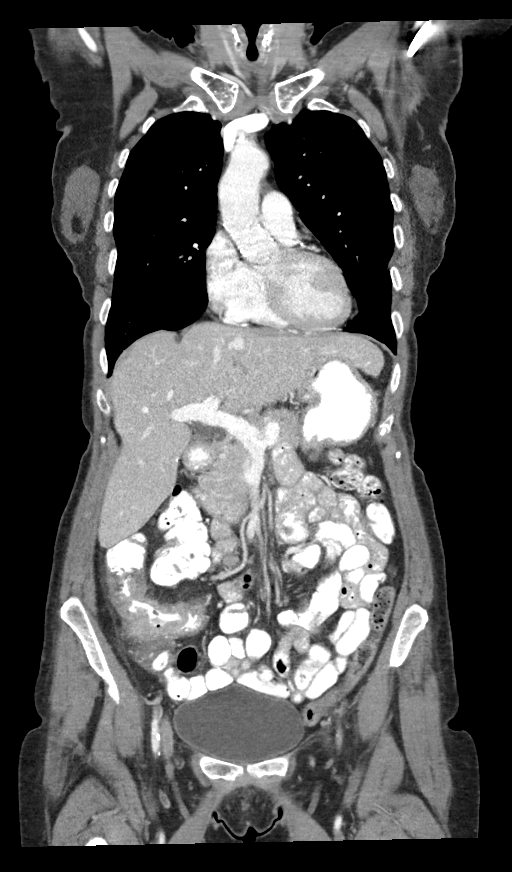
[im 53/118  soft-tissue]
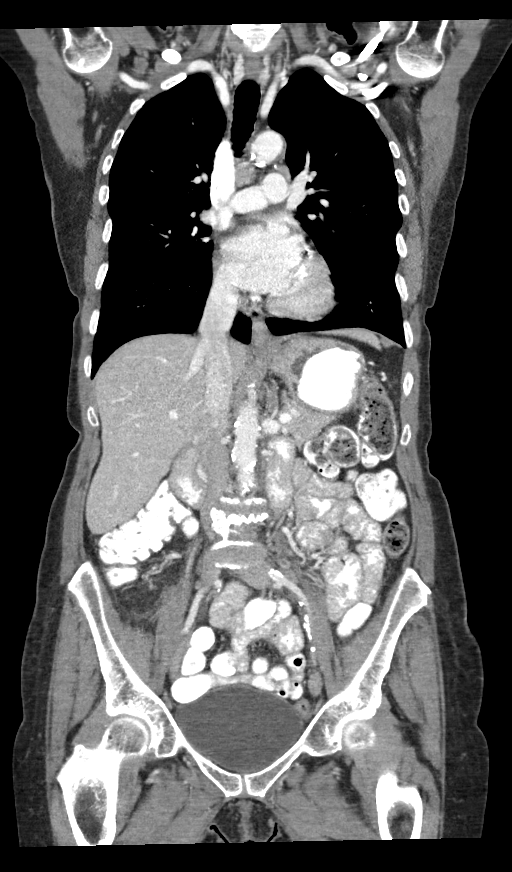
[im 66/118  soft-tissue]
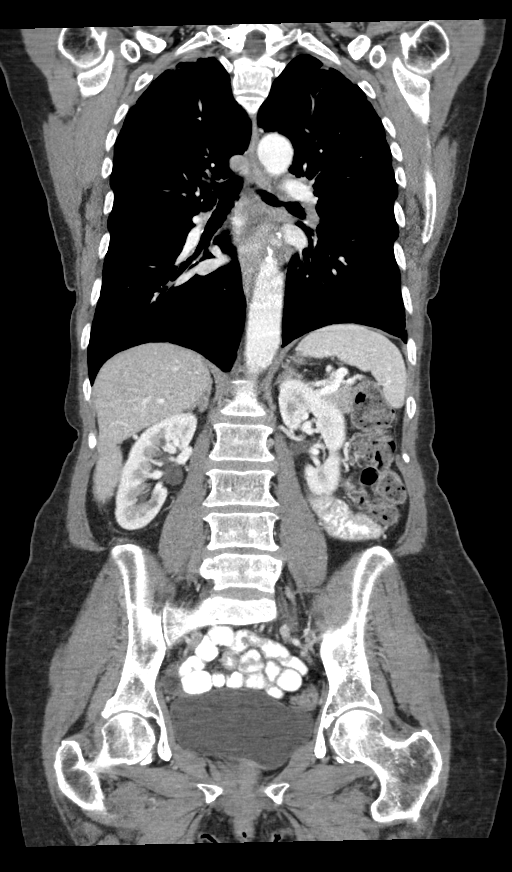

[13 of 46 positions shown; findings below may reference images not displayed]

FINDINGS: CT CHEST FINDINGS

Cardiovascular: The heart size is normal. No substantial pericardial
effusion. Coronary artery calcification is evident. Atherosclerotic
calcification is noted in the wall of the thoracic aorta.

Mediastinum/Nodes: 11 mm short axis precarinal lymph node is stable.
Other upper normal mediastinal and hilar lymphadenopathy is similar
to prior. The esophagus has normal imaging features. There is no
axillary lymphadenopathy.

Lungs/Pleura: The central tracheobronchial airways are patent.
Centrilobular emphsyema noted. Calcified granuloma noted posterior
right upper lobe. Noncalcified 2 mm right upper lobe nodule visible
on 57/5, new since prior study. 2 mm right upper lobe nodule also
visible on 46/5. 7 mm sub solid left upper lobe nodule (54/5) was 6
mm previously.

Musculoskeletal: No worrisome lytic or sclerotic osseous
abnormality.

CT ABDOMEN PELVIS FINDINGS

Hepatobiliary: No suspicious focal abnormality within the liver
parenchyma. Gallbladder is surgically absent. No intrahepatic or
extrahepatic biliary dilation.

Pancreas: No focal mass lesion. No dilatation of the main duct. No
intraparenchymal cyst. No peripancreatic edema.

Spleen: No splenomegaly. No focal mass lesion.

Adrenals/Urinary Tract: Left adrenal thickening evident without
discrete nodule or mass. Right adrenal gland unremarkable 5 mm
low-density lesion interpolar right kidney is too small to
characterize. Similar 3 mm low-density lesion identified lower pole
right kidney. Left kidney unremarkable. No evidence for hydroureter.
The urinary bladder appears normal for the degree of distention.

Stomach/Bowel: Stomach is unremarkable. Areas of apparent wall
thickening in the gastric cardia and body of stomach are
indeterminate. No evidence of outlet obstruction. Duodenum is
normally positioned as is the ligament of Treitz. No small bowel
wall thickening. No small bowel dilatation. Mild wall thickening
noted in the terminal ileum and ileocecal valve. The appendix is not
visualized, but there is no edema or inflammation in the region of
the cecum. Circumferential wall thickening noted in the cecum at the
level of the ileocecal valve. Colonic wall is ill-defined in there
is pericolonic soft tissue stranding in this region. Left colon
unremarkable.

Vascular/Lymphatic: There is abdominal aortic atherosclerosis
without aneurysm. There is no gastrohepatic or hepatoduodenal
ligament lymphadenopathy. Small lymph nodes are noted in the
ileocolic mesentery measuring up to 6 mm short axis (image 89/series
3. No retroperitoneal lymphadenopathy. No pelvic sidewall
lymphadenopathy.

Reproductive: The uterus is surgically absent. There is no adnexal
mass.

Other: No intraperitoneal free fluid.

Musculoskeletal: No worrisome lytic or sclerotic osseous
abnormality.
IMPRESSION: 1. Circumferential irregular wall thickening in the cecum, involving
the ileocecal valve with some wall thickening in the terminal ileum
as well. There is soft tissue stranding around the abnormal cecal
wall raising the question of transmural extension of tumor. Small
lymph nodes are noted in the adjacent ileocolic mesentery a
metastatic disease cannot be excluded.
2. No evidence for hepatic metastases.
3. Tiny pulmonary nodules, some of which are new since 04/13/2015.
Attention on follow-up recommended to exclude metastatic
involvement.
4.  Aortic Atherosclerois (S2FZR-170.0)
5.  Emphysema. (S2FZR-WFK.B)

## 2021-06-01 ENCOUNTER — Ambulatory Visit: Payer: Self-pay | Admitting: General Surgery

## 2021-06-01 ENCOUNTER — Encounter: Payer: Self-pay | Admitting: Oncology

## 2021-06-02 ENCOUNTER — Ambulatory Visit: Payer: Self-pay | Admitting: General Surgery

## 2021-06-02 MED ORDER — ORAL CARE MOUTH RINSE: 15.0000 mL | Freq: Once | OROMUCOSAL | Status: DC

## 2021-06-02 NOTE — H&P (Signed)
HISTORY OF PRESENT ILLNESS:    Mary Beltran is a 81 y.o.female patient who comes for evaluation of right leg skin lesion.  The patient endorses that she had a skin lesion on the right leg.  She had a shave biopsy that showed melanocytic cells with atypia and concern of possible melanoma in situ.  Patient endorses that she had these biopsy around 2 to 4 weeks ago and the wound has not healed.  She denies any previous skin cancer.  She denies any family history of skin cancer.  She denies any pain.  There is a pronation.  There is no alleviating or aggravating factors.  Patient denies any weight loss.      PAST MEDICAL HISTORY:  Past Medical History:  Diagnosis Date   Allergy    Cramps, muscle, general 04/23/2017   Better with mag        PAST SURGICAL HISTORY:   Past Surgical History:  Procedure Laterality Date   [place]   FRACTURE SURGERY  2016   Right Wrist   CHOLECYSTECTOMY  08/22/2018   DR Reeves Forth Cintron   COLONOSCOPY  10/06/2018   Invasive Adenocarcinoma, moderately differentiated/Repeat 52yrTKT   EGD  10/06/2018   Chronic inflammation/No Repeat/TKT   APPENDECTOMY     COLON SURGERY           MEDICATIONS:  Outpatient Encounter Medications as of 06/01/2021  Medication Sig Dispense Refill   azelastine (ASTELIN) 137 mcg nasal spray Place 2 sprays into both nostrils 2 (two) times daily 30 mL 1   multivitamin with minerals tablet Take by mouth     bismuth subsalicylate (PEPTO BISMOL) 262 mg/15 mL suspension Take by mouth (Patient not taking: Reported on 10/16/2019)     predniSONE (DELTASONE) 10 MG tablet 6-day taper; 6 pills day one, 5 pills day 2, 4 pills day 3, etc. Follow package instructions. (Patient not taking: Reported on 10/11/2020) 21 tablet 0   No facility-administered encounter medications on file as of 06/01/2021.     ALLERGIES:   Acetaminophen, Neomycin-bacitracin-polymyxin, Ciprofloxacin, Codeine, Penicillins, and Sulfa  (sulfonamide antibiotics)   SOCIAL HISTORY:  Social History   Socioeconomic History   Marital status: Divorced  Tobacco Use   Smoking status: Every Day    Packs/day: 0.00    Years: 60.00    Pack years: 0.00    Types: Cigarettes   Smokeless tobacco: Never  Vaping Use   Vaping Use: Never used  Substance and Sexual Activity   Alcohol use: No   Drug use: No   Sexual activity: Not Currently    Partners: Male    FAMILY HISTORY:  Family History  Problem Relation Age of Onset   Glaucoma Mother    Ulcers Father    Cancer Sister    Thyroid disease Sister    Goiter Sister    Heart disease Paternal Grandfather      GENERAL REVIEW OF SYSTEMS:   General ROS: negative for - chills, fatigue, fever, weight gain or weight loss Allergy and Immunology ROS: negative for - hives  Hematological and Lymphatic ROS: negative for - bleeding problems or bruising, negative for palpable nodes Endocrine ROS: negative for - heat or cold intolerance, hair changes Respiratory ROS: negative for - cough, shortness of breath or wheezing Cardiovascular ROS: no chest pain or palpitations GI ROS: negative for nausea, vomiting, abdominal pain, diarrhea, constipation Musculoskeletal ROS: negative for - joint swelling or muscle pain Neurological ROS: negative for - confusion,  syncope Dermatological ROS: negative for pruritus and rash  PHYSICAL EXAM:  Vitals:   06/01/21 1051  BP: 139/72  Pulse: (!) 113  .  Ht:170.2 cm (5' 7" ) Wt:57.2 kg (126 lb) FVW:AQLR surface area is 1.64 meters squared. Body mass index is 19.73 kg/m.Marland Kitchen   GENERAL: Alert, active, oriented x3  HEENT: Pupils equal reactive to light. Extraocular movements are intact. Sclera clear. Palpebral conjunctiva normal red color.Pharynx clear.  NECK: Supple with no palpable mass and no adenopathy.  LUNGS: Sound clear with no rales rhonchi or wheezes.  HEART: Regular rhythm S1 and S2 without murmur.  ABDOMEN: Soft and depressible,  nontender with no palpable mass, no hepatomegaly.   EXTREMITIES: Well-developed well-nourished symmetrical with no dependent edema.  Right leg skin lesion, scar from shave biopsy.  No purulence.  No cellulitis.  NEUROLOGICAL: Awake alert oriented, facial expression symmetrical, moving all extremities.      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  This shows melanocytic cells with atypia concerning for carcinoma in situ.  I discussed patient recommendation of complete excision with wide local excision with adequate margins.  Currently atypical cells extend to the margins.  Patient oriented about the surgical procedure, the benefit and risk.  She understood and agreed to proceed.        PLAN:  Excision of melanoma in situ of right leg (37366) Avoid taking aspirin 5 days before the surgery  CBC, CMP (done) Contact us if you have any concern.   Patient verbalized understanding, all questions were answered, and were agreeable with the plan outlined above.   Herbert Pun, MD  Electronically signed by Herbert Pun, MD

## 2021-06-02 NOTE — H&P (View-Only) (Signed)
HISTORY OF PRESENT ILLNESS:    Mary Beltran is a 81 y.o.female patient who comes for evaluation of right leg skin lesion.  The patient endorses that she had a skin lesion on the right leg.  She had a shave biopsy that showed melanocytic cells with atypia and concern of possible melanoma in situ.  Patient endorses that she had these biopsy around 2 to 4 weeks ago and the wound has not healed.  She denies any previous skin cancer.  She denies any family history of skin cancer.  She denies any pain.  There is a pronation.  There is no alleviating or aggravating factors.  Patient denies any weight loss.      PAST MEDICAL HISTORY:  Past Medical History:  Diagnosis Date   Allergy    Cramps, muscle, general 04/23/2017   Better with mag        PAST SURGICAL HISTORY:   Past Surgical History:  Procedure Laterality Date   [place]   FRACTURE SURGERY  2016   Right Wrist   CHOLECYSTECTOMY  08/22/2018   DR Reeves Forth Cintron   COLONOSCOPY  10/06/2018   Invasive Adenocarcinoma, moderately differentiated/Repeat 14yrTKT   EGD  10/06/2018   Chronic inflammation/No Repeat/TKT   APPENDECTOMY     COLON SURGERY           MEDICATIONS:  Outpatient Encounter Medications as of 06/01/2021  Medication Sig Dispense Refill   azelastine (ASTELIN) 137 mcg nasal spray Place 2 sprays into both nostrils 2 (two) times daily 30 mL 1   multivitamin with minerals tablet Take by mouth     bismuth subsalicylate (PEPTO BISMOL) 262 mg/15 mL suspension Take by mouth (Patient not taking: Reported on 10/16/2019)     predniSONE (DELTASONE) 10 MG tablet 6-day taper; 6 pills day one, 5 pills day 2, 4 pills day 3, etc. Follow package instructions. (Patient not taking: Reported on 10/11/2020) 21 tablet 0   No facility-administered encounter medications on file as of 06/01/2021.     ALLERGIES:   Acetaminophen, Neomycin-bacitracin-polymyxin, Ciprofloxacin, Codeine, Penicillins, and Sulfa  (sulfonamide antibiotics)   SOCIAL HISTORY:  Social History   Socioeconomic History   Marital status: Divorced  Tobacco Use   Smoking status: Every Day    Packs/day: 0.00    Years: 60.00    Pack years: 0.00    Types: Cigarettes   Smokeless tobacco: Never  Vaping Use   Vaping Use: Never used  Substance and Sexual Activity   Alcohol use: No   Drug use: No   Sexual activity: Not Currently    Partners: Male    FAMILY HISTORY:  Family History  Problem Relation Age of Onset   Glaucoma Mother    Ulcers Father    Cancer Sister    Thyroid disease Sister    Goiter Sister    Heart disease Paternal Grandfather      GENERAL REVIEW OF SYSTEMS:   General ROS: negative for - chills, fatigue, fever, weight gain or weight loss Allergy and Immunology ROS: negative for - hives  Hematological and Lymphatic ROS: negative for - bleeding problems or bruising, negative for palpable nodes Endocrine ROS: negative for - heat or cold intolerance, hair changes Respiratory ROS: negative for - cough, shortness of breath or wheezing Cardiovascular ROS: no chest pain or palpitations GI ROS: negative for nausea, vomiting, abdominal pain, diarrhea, constipation Musculoskeletal ROS: negative for - joint swelling or muscle pain Neurological ROS: negative for - confusion,  syncope Dermatological ROS: negative for pruritus and rash  PHYSICAL EXAM:  Vitals:   06/01/21 1051  BP: 139/72  Pulse: (!) 113  .  Ht:170.2 cm (5' 7" ) Wt:57.2 kg (126 lb) EEF:EOFH surface area is 1.64 meters squared. Body mass index is 19.73 kg/m.Marland Kitchen   GENERAL: Alert, active, oriented x3  HEENT: Pupils equal reactive to light. Extraocular movements are intact. Sclera clear. Palpebral conjunctiva normal red color.Pharynx clear.  NECK: Supple with no palpable mass and no adenopathy.  LUNGS: Sound clear with no rales rhonchi or wheezes.  HEART: Regular rhythm S1 and S2 without murmur.  ABDOMEN: Soft and depressible,  nontender with no palpable mass, no hepatomegaly.   EXTREMITIES: Well-developed well-nourished symmetrical with no dependent edema.  Right leg skin lesion, scar from shave biopsy.  No purulence.  No cellulitis.  NEUROLOGICAL: Awake alert oriented, facial expression symmetrical, moving all extremities.      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  This shows melanocytic cells with atypia concerning for carcinoma in situ.  I discussed patient recommendation of complete excision with wide local excision with adequate margins.  Currently atypical cells extend to the margins.  Patient oriented about the surgical procedure, the benefit and risk.  She understood and agreed to proceed.        PLAN:  Excision of melanoma in situ of right leg (21975) Avoid taking aspirin 5 days before the surgery  CBC, CMP (done) Contact us if you have any concern.   Patient verbalized understanding, all questions were answered, and were agreeable with the plan outlined above.   Herbert Pun, MD  Electronically signed by Herbert Pun, MD

## 2021-06-05 ENCOUNTER — Encounter
Admission: RE | Disposition: A | Discharge status: Home / Self Care | Payer: Self-pay | Source: Home / Self Care | Attending: General Surgery

## 2021-06-05 ENCOUNTER — Other Ambulatory Visit: Payer: Self-pay

## 2021-06-05 ENCOUNTER — Ambulatory Visit: Payer: Medicare HMO | Admitting: Anesthesiology

## 2021-06-05 ENCOUNTER — Encounter: Payer: Self-pay | Admitting: General Surgery

## 2021-06-05 ENCOUNTER — Ambulatory Visit
Admission: RE | Admit: 2021-06-05 | Discharge: 2021-06-05 | Disposition: A | Discharge status: Home / Self Care | Payer: Medicare HMO | Attending: General Surgery | Admitting: General Surgery

## 2021-06-05 DIAGNOSIS — E43 Unspecified severe protein-calorie malnutrition: Secondary | ICD-10-CM

## 2021-06-05 DIAGNOSIS — K219 Gastro-esophageal reflux disease without esophagitis: Secondary | ICD-10-CM | POA: Diagnosis not present

## 2021-06-05 DIAGNOSIS — L814 Other melanin hyperpigmentation: Secondary | ICD-10-CM | POA: Diagnosis not present

## 2021-06-05 DIAGNOSIS — L570 Actinic keratosis: Secondary | ICD-10-CM | POA: Diagnosis not present

## 2021-06-05 DIAGNOSIS — Z85828 Personal history of other malignant neoplasm of skin: Secondary | ICD-10-CM

## 2021-06-05 DIAGNOSIS — C4371 Malignant melanoma of right lower limb, including hip: Secondary | ICD-10-CM | POA: Diagnosis present

## 2021-06-05 HISTORY — PX: EXCISION MASS LOWER EXTREMETIES: SHX6705

## 2021-06-05 LAB — BASIC METABOLIC PANEL
Anion gap: 14 (ref 5–15)
BUN: 12 mg/dL (ref 8–23)
CO2: 18 mmol/L — ABNORMAL LOW (ref 22–32)
Calcium: 9.2 mg/dL (ref 8.9–10.3)
Chloride: 102 mmol/L (ref 98–111)
Creatinine, Ser: 0.68 mg/dL (ref 0.44–1.00)
GFR, Estimated: >60 mL/min (ref >60)
Glucose, Bld: 108 mg/dL — ABNORMAL HIGH (ref 70–99)
Potassium: 4.1 mmol/L (ref 3.5–5.1)
Sodium: 134 mmol/L — ABNORMAL LOW (ref 135–145)

## 2021-06-05 LAB — CBC
HCT: 39.7 % (ref 36.0–46.0)
Hemoglobin: 12.8 g/dL (ref 12.0–15.0)
MCH: 27.9 pg (ref 26.0–34.0)
MCHC: 32.2 g/dL (ref 30.0–36.0)
MCV: 86.7 fL (ref 80.0–100.0)
Platelets: 346 10*3/uL (ref 150–400)
RBC: 4.58 MIL/uL (ref 3.87–5.11)
RDW: 13.9 % (ref 11.5–15.5)
WBC: 10.3 10*3/uL (ref 4.0–10.5)
nRBC: 0 % (ref 0.0–0.2)

## 2021-06-05 SURGERY — EXCISION MASS LOWER EXTREMITIES
Anesthesia: General | Site: Leg Lower | Laterality: Right

## 2021-06-05 MED ORDER — ORAL CARE MOUTH RINSE: 15.0000 mL | Freq: Once | OROMUCOSAL | Status: AC

## 2021-06-05 MED ORDER — BUPIVACAINE-EPINEPHRINE 0.5% -1:200000 IJ SOLN
INTRAMUSCULAR | Status: DC | PRN
  Administered 2021-06-05: 10 mL
  Administered 2021-06-05: 20 mL

## 2021-06-05 MED ORDER — CHLORHEXIDINE GLUCONATE 0.12 % MT SOLN
OROMUCOSAL | Status: AC
  Administered 2021-06-05: 15 mL via OROMUCOSAL
  Filled 2021-06-05: qty 15

## 2021-06-05 MED ORDER — PHENYLEPHRINE HCL (PRESSORS) 10 MG/ML IV SOLN
INTRAVENOUS | Status: AC
  Filled 2021-06-05: qty 1

## 2021-06-05 MED ORDER — LACTATED RINGERS IV SOLN: INTRAVENOUS | Status: DC

## 2021-06-05 MED ORDER — OXYCODONE HCL 5 MG PO TABS: 5.0000 mg | ORAL_TABLET | Freq: Once | ORAL | Status: DC | PRN

## 2021-06-05 MED ORDER — PROPOFOL 10 MG/ML IV BOLUS
INTRAVENOUS | Status: DC | PRN
  Administered 2021-06-05: 30 mg via INTRAVENOUS
  Administered 2021-06-05: 10 mg via INTRAVENOUS

## 2021-06-05 MED ORDER — PROPOFOL 500 MG/50ML IV EMUL
INTRAVENOUS | Status: DC | PRN
  Administered 2021-06-05: 150 ug/kg/min via INTRAVENOUS

## 2021-06-05 MED ORDER — PROPOFOL 500 MG/50ML IV EMUL
INTRAVENOUS | Status: AC
  Filled 2021-06-05: qty 50

## 2021-06-05 MED ORDER — TRAMADOL HCL 50 MG PO TABS
50.0000 mg | ORAL_TABLET | Freq: Four times a day (QID) | ORAL | 0 refills | Status: DC | PRN
Start: 2021-06-05 — End: 2021-09-22

## 2021-06-05 MED ORDER — CLINDAMYCIN PHOSPHATE 900 MG/50ML IV SOLN
INTRAVENOUS | Status: AC
  Filled 2021-06-05: qty 50

## 2021-06-05 MED ORDER — DROPERIDOL 2.5 MG/ML IJ SOLN
0.6250 mg | Freq: Once | INTRAMUSCULAR | Status: DC | PRN
  Filled 2021-06-05: qty 2

## 2021-06-05 MED ORDER — CHLORHEXIDINE GLUCONATE 0.12 % MT SOLN: 15.0000 mL | Freq: Once | OROMUCOSAL | Status: AC

## 2021-06-05 MED ORDER — CLINDAMYCIN PHOSPHATE 900 MG/50ML IV SOLN
900.0000 mg | INTRAVENOUS | Status: AC
  Administered 2021-06-05: 900 mg via INTRAVENOUS

## 2021-06-05 MED ORDER — LIDOCAINE HCL (PF) 2 % IJ SOLN
INTRAMUSCULAR | Status: AC
  Filled 2021-06-05: qty 5

## 2021-06-05 MED ORDER — BUPIVACAINE-EPINEPHRINE (PF) 0.5% -1:200000 IJ SOLN
INTRAMUSCULAR | Status: AC
  Filled 2021-06-05: qty 30

## 2021-06-05 MED ORDER — PHENYLEPHRINE HCL (PRESSORS) 10 MG/ML IV SOLN
INTRAVENOUS | Status: DC | PRN
  Administered 2021-06-05: 80 ug via INTRAVENOUS

## 2021-06-05 MED ORDER — 0.9 % SODIUM CHLORIDE (POUR BTL) OPTIME
TOPICAL | Status: DC | PRN
  Administered 2021-06-05: 30 mL

## 2021-06-05 MED ORDER — OXYCODONE HCL 5 MG/5ML PO SOLN: 5.0000 mg | Freq: Once | ORAL | Status: DC | PRN

## 2021-06-05 MED ORDER — ACETAMINOPHEN 10 MG/ML IV SOLN
INTRAVENOUS | Status: AC
  Filled 2021-06-05: qty 100

## 2021-06-05 MED ORDER — DEXAMETHASONE SODIUM PHOSPHATE 10 MG/ML IJ SOLN
INTRAMUSCULAR | Status: DC | PRN
  Administered 2021-06-05: 5 mg via INTRAVENOUS

## 2021-06-05 MED ORDER — FENTANYL CITRATE (PF) 100 MCG/2ML IJ SOLN: 25.0000 ug | INTRAMUSCULAR | Status: DC | PRN

## 2021-06-05 SURGICAL SUPPLY — 42 items
ADH SKN CLS APL DERMABOND .7 (GAUZE/BANDAGES/DRESSINGS) ×1
APL PRP STRL LF DISP 70% ISPRP (MISCELLANEOUS) ×1
BLADE SURG 15 STRL LF DISP TIS (BLADE) ×1 IMPLANT
BLADE SURG 15 STRL SS (BLADE)
BNDG ELASTIC 4X5.8 VLCR STR LF (GAUZE/BANDAGES/DRESSINGS) ×1 IMPLANT
CHLORAPREP W/TINT 26 (MISCELLANEOUS) ×2 IMPLANT
DERMABOND ADVANCED (GAUZE/BANDAGES/DRESSINGS) ×1
DERMABOND ADVANCED .7 DNX12 (GAUZE/BANDAGES/DRESSINGS) ×1 IMPLANT
DRAPE 3/4 80X56 (DRAPES) ×2 IMPLANT
ELECT CAUTERY BLADE 6.4 (BLADE) ×2 IMPLANT
ELECT REM PT RETURN 9FT ADLT (ELECTROSURGICAL) ×2
ELECTRODE REM PT RTRN 9FT ADLT (ELECTROSURGICAL) ×1 IMPLANT
GAUZE SPONGE 4X4 12PLY STRL (GAUZE/BANDAGES/DRESSINGS) ×1 IMPLANT
GAUZE XEROFORM 1X8 LF (GAUZE/BANDAGES/DRESSINGS) ×1 IMPLANT
GLOVE SURG ENC MOIS LTX SZ6.5 (GLOVE) ×2 IMPLANT
GLOVE SURG UNDER POLY LF SZ6.5 (GLOVE) ×2 IMPLANT
GOWN STRL REUS W/ TWL LRG LVL3 (GOWN DISPOSABLE) ×2 IMPLANT
GOWN STRL REUS W/TWL LRG LVL3 (GOWN DISPOSABLE) ×4
KIT TURNOVER KIT A (KITS) ×2 IMPLANT
LABEL OR SOLS (LABEL) ×2 IMPLANT
MANIFOLD NEPTUNE II (INSTRUMENTS) ×2 IMPLANT
NDL HYPO 25X1 1.5 SAFETY (NEEDLE) ×1 IMPLANT
NEEDLE HYPO 22GX1.5 SAFETY (NEEDLE) ×2 IMPLANT
NEEDLE HYPO 25X1 1.5 SAFETY (NEEDLE) ×2 IMPLANT
NS IRRIG 500ML POUR BTL (IV SOLUTION) ×2 IMPLANT
PACK DRAINAGE HVY KERLIX W/SPG (MISCELLANEOUS) ×1 IMPLANT
PACK EXTREMITY ARMC (MISCELLANEOUS) ×2 IMPLANT
PAD PREP 24X41 OB/GYN DISP (PERSONAL CARE ITEMS) ×1 IMPLANT
STOCKINETTE BIAS CUT 4 980044 (GAUZE/BANDAGES/DRESSINGS) ×2 IMPLANT
SUT MNCRL 4-0 (SUTURE)
SUT MNCRL 4-0 27XMFL (SUTURE)
SUT PROLENE 2-0 (SUTURE) ×2
SUT PROLENE 2-0 RB1 36X2 ARM (SUTURE) ×1
SUT SILK 2 0 PERMA HAND 18 BK (SUTURE) ×1 IMPLANT
SUT VIC AB 2-0 SH 27 (SUTURE)
SUT VIC AB 2-0 SH 27XBRD (SUTURE) ×1 IMPLANT
SUT VIC AB 3-0 SH 27 (SUTURE) ×2
SUT VIC AB 3-0 SH 27X BRD (SUTURE) ×1 IMPLANT
SUTURE MNCRL 4-0 27XMF (SUTURE) ×1 IMPLANT
SUTURE PROLEN 2-0 RB1 36X2 ARM (SUTURE) IMPLANT
SYR 10ML LL (SYRINGE) ×2 IMPLANT
WATER STERILE IRR 500ML POUR (IV SOLUTION) ×1 IMPLANT

## 2021-06-05 NOTE — Discharge Instructions (Addendum)
°  Diet: Resume home heart healthy regular diet.   Activity: Increase activity as tolerated. Light activity and walking are encouraged. Do not drive or drink alcohol if taking narcotic pain medications.  Wound care: Remove dressing tomorrow. Once dressing removed, may shower with soapy water and pat dry (do not rub incisions), but no baths or submerging incision underwater until follow-up. (no swimming)   Medications: Resume all home medications. For mild to moderate pain: acetaminophen (Tylenol) or ibuprofen (if no kidney disease). Combining Tylenol with alcohol can substantially increase your risk of causing liver disease. Narcotic pain medications, if prescribed, can be used for severe pain, though may cause nausea, constipation, and drowsiness. If you do not need the narcotic pain medication, you do not need to fill the prescription.  Call office [phone]) at any time if any questions, worsening pain, fevers/chills, bleeding, drainage from incision site, or other concerns. AMBULATORY SURGERY  DISCHARGE INSTRUCTIONS   The drugs that you were given will stay in your system until tomorrow so for the next 24 hours you should not:  Drive an automobile Make any legal decisions Drink any alcoholic beverage   You may resume regular meals tomorrow.  Today it is better to start with liquids and gradually work up to solid foods.  You may eat anything you prefer, but it is better to start with liquids, then soup and crackers, and gradually work up to solid foods.   Please notify your doctor immediately if you have any unusual bleeding, trouble breathing, redness and pain at the surgery site, drainage, fever, or pain not relieved by medication.    Additional Instructions:        Please contact your physician with any problems or Same Day Surgery at [phone], Monday through Friday 6 am to 4 pm, or [place] at [facility] number at [phone].

## 2021-06-05 NOTE — Transfer of Care (Signed)
Immediate Anesthesia Transfer of Care Note  Patient: Mary Beltran  Procedure(s) Performed: Procedure(s): EXCISION MASS LOWER EXTREMITY (Right)  Patient Location: PACU  Anesthesia Type:General  Level of Consciousness: sedated  Airway & Oxygen Therapy: Patient Spontanous Breathing and Patient connected to face mask oxygen  Post-op Assessment: Report given to RN and Post -op Vital signs reviewed and stable  Post vital signs: Reviewed and stable  Last Vitals:  Vitals:   06/05/21 0627 06/05/21 0831  BP: (!) 131/92 121/61  Pulse: 98 78  Resp: 16 18  Temp: 36.7 C 36.4 C  SpO2: 76% 22%    Complications: No apparent anesthesia complications

## 2021-06-05 NOTE — Anesthesia Postprocedure Evaluation (Signed)
Anesthesia Post Note  Patient: Mary Beltran  Procedure(s) Performed: EXCISION MASS LOWER EXTREMITY (Right: Leg Lower)  Patient location during evaluation: PACU Anesthesia Type: General Level of consciousness: awake and alert Pain management: pain level controlled Vital Signs Assessment: post-procedure vital signs reviewed and stable Respiratory status: spontaneous breathing, nonlabored ventilation and respiratory function stable Cardiovascular status: blood pressure returned to baseline and stable Postop Assessment: no apparent nausea or vomiting Anesthetic complications: no   No notable events documented.   Last Vitals:  Vitals:   06/05/21 0900 06/05/21 0912  BP: (!) 150/70 (!) 160/78  Pulse: 80 75  Resp: 13 14  Temp: 36.6 C (!) 36.2 C  SpO2: 99% 99%    Last Pain:  Vitals:   06/05/21 0912  TempSrc: Temporal  PainSc: 0-No pain                 Iran Ouch

## 2021-06-05 NOTE — Interval H&P Note (Signed)
History and Physical Interval Note:  06/05/2021 6:56 AM  Mary Beltran  has presented today for surgery, with the diagnosis of C43.71 Malignant melanoma of rt lower extremity including hip.  The various methods of treatment have been discussed with the patient and family. After consideration of risks, benefits and other options for treatment, the patient has consented to  Procedure(s): EXCISION SKIN LESION LOWER EXTREMETIES (Right) as a surgical intervention.  The patient's history has been reviewed, patient examined, no change in status, stable for surgery.  I have reviewed the patient's chart and labs.  Questions were answered to the patient's satisfaction.     Herbert Pun

## 2021-06-05 NOTE — Anesthesia Preprocedure Evaluation (Addendum)
Anesthesia Evaluation  Patient identified by MRN, date of birth, ID band Patient awake    Reviewed: Allergy & Precautions, NPO status , Patient's Chart, lab work & pertinent test results  History of Anesthesia Complications (+) PONV and history of anesthetic complications  Airway Mallampati: II  TM Distance: >3 FB Neck ROM: full    Dental  (+) Edentulous Upper, Edentulous Lower   Pulmonary Current Smoker,    Pulmonary exam normal        Cardiovascular Exercise Tolerance: Poor METS (>4): Normal cardiovascular exam+ dysrhythmias (RBBB)      Neuro/Psych negative neurological ROS  negative psych ROS   GI/Hepatic Neg liver ROS, GERD  ,  Endo/Other  negative endocrine ROS  Renal/GU      Musculoskeletal   Abdominal   Peds  Hematology negative hematology ROS (+)   Anesthesia Other Findings Malignant melanoma of rt lower extremity including hip  Generalized weakness  Past Medical History: No date: Cancer ([place]) No date: Complication of anesthesia     Comment:  NOT WITH GB No date: GERD (gastroesophageal reflux disease) 10/13/2018: IDA (iron deficiency anemia) No date: PONV (postoperative nausea and vomiting)  Past Surgical History: No date: ABDOMINAL HYSTERECTOMY     Comment:  complete No date: APPENDECTOMY 08/22/2018: CHOLECYSTECTOMY; N/A     Comment:  Procedure: LAPAROSCOPIC CHOLECYSTECTOMY;  Surgeon:               Herbert Pun, MD;  Location: [facility] ORS;  Service:              General;  Laterality: N/A; No date: COLONOSCOPY 10/23/2018: LAPAROSCOPIC RIGHT COLECTOMY; N/A     Comment:  Procedure: LAPAROSCOPIC HAND ASSISTED RIGHT COLECTOMY;                Surgeon: Herbert Pun, MD;  Location: [facility] ORS;               Service: General;  Laterality: N/A; No date: PERCUTANEOUS PINNING WRIST FRACTURE; Right 12/08/2018: PORTACATH PLACEMENT; Right     Comment:  Procedure: INSERTION PORT-A-CATH, CENTRAL  VENOUS CATH               WITH SUBCUTANEOUS INFUSION PORT;  Surgeon: Herbert Pun, MD;  Location: [facility] ORS;  Service: General;                Laterality: Right; No date: WRIST FOREIGN BODY REMOVAL; Right     Comment:  2016     Reproductive/Obstetrics negative OB ROS                            Anesthesia Physical Anesthesia Plan  ASA: 2  Anesthesia Plan: General   Post-op Pain Management:    Induction: Intravenous  PONV Risk Score and Plan: 4 or greater and Ondansetron, Propofol infusion, TIVA and Treatment may vary due to age or medical condition  Airway Management Planned: Nasal Cannula and Natural Airway  Additional Equipment:   Intra-op Plan:   Post-operative Plan:   Informed Consent: I have reviewed the patients History and Physical, chart, labs and discussed the procedure including the risks, benefits and alternatives for the proposed anesthesia with the patient or authorized representative who has indicated his/her understanding and acceptance.     Dental advisory given  Plan Discussed with: Anesthesiologist, CRNA and Surgeon  Anesthesia Plan Comments:        Anesthesia  Quick Evaluation

## 2021-06-05 NOTE — Op Note (Signed)
OPERATION REPORT  Pre Operative Diagnosis: Right leg melanoma in situ  Post operative diagnosis: Right leg melanoma in situ  Anesthesia: MAC and Local   Surgeon: Dr. Windell Moment   Indication: This 81 y.o. year old female with a skin lesion that showed melanocytes with atypia concerning for melanoma in situ.    Description of procedure: after orienting patient about the procedure steps and benefits and patient agreed to proceed. Time out was done identifying correct patient and location of procedure. After induction of monitored sedation, local anesthesia was infiltrated around the palpable lesion. With a blade #15, an elliptical incision was made using the skin lines. Sharp dissection was carried down and lesion was excised including dermal tissue. The excision size was 7 cm x 2.5 cm. Deep dermal stitches were done with 3-0 Vicryl to repair the laceration and skin closed with Monocryl 4-0 in subcuticular fashion. Closure was reinforced with 3 Mattress sutures with 3-0 Prolene. Specimen oriented and sent to pathology.    Complications: none   EBL: minimal  Herbert Pun, MD, FACS

## 2021-06-05 NOTE — Anesthesia Procedure Notes (Signed)
Date/Time: 06/05/2021 7:35 AM Performed by: Doreen Salvage, CRNA Pre-anesthesia Checklist: Patient identified, Emergency Drugs available, Suction available and Patient being monitored Patient Re-evaluated:Patient Re-evaluated prior to induction Oxygen Delivery Method: Simple face mask Induction Type: IV induction Dental Injury: Teeth and Oropharynx as per pre-operative assessment

## 2021-06-06 ENCOUNTER — Encounter: Payer: Self-pay | Admitting: General Surgery

## 2021-06-06 LAB — SURGICAL PATHOLOGY

## 2021-09-13 ENCOUNTER — Other Ambulatory Visit (HOSPITAL_COMMUNITY): Payer: Self-pay | Admitting: Internal Medicine

## 2021-09-13 ENCOUNTER — Other Ambulatory Visit: Payer: Self-pay | Admitting: Internal Medicine

## 2021-09-13 DIAGNOSIS — R1011 Right upper quadrant pain: Secondary | ICD-10-CM

## 2021-09-13 DIAGNOSIS — R519 Headache, unspecified: Secondary | ICD-10-CM

## 2021-09-13 DIAGNOSIS — C189 Malignant neoplasm of colon, unspecified: Secondary | ICD-10-CM

## 2021-09-13 DIAGNOSIS — R42 Dizziness and giddiness: Secondary | ICD-10-CM

## 2021-09-14 ENCOUNTER — Ambulatory Visit
Admission: RE | Admit: 2021-09-14 | Discharge: 2021-09-14 | Disposition: A | Discharge status: Home / Self Care | Payer: Medicare HMO | Source: Ambulatory Visit | Attending: Internal Medicine | Admitting: Internal Medicine

## 2021-09-14 DIAGNOSIS — R519 Headache, unspecified: Secondary | ICD-10-CM

## 2021-09-14 DIAGNOSIS — R1011 Right upper quadrant pain: Secondary | ICD-10-CM | POA: Diagnosis present

## 2021-09-14 DIAGNOSIS — R42 Dizziness and giddiness: Secondary | ICD-10-CM | POA: Insufficient documentation

## 2021-09-14 DIAGNOSIS — C189 Malignant neoplasm of colon, unspecified: Secondary | ICD-10-CM | POA: Insufficient documentation

## 2021-09-14 MED ORDER — IOHEXOL 300 MG/ML  SOLN
100.0000 mL | Freq: Once | INTRAMUSCULAR | Status: AC | PRN
  Administered 2021-09-14: 100 mL via INTRAVENOUS

## 2021-09-14 MED ORDER — GADOBUTROL 1 MMOL/ML IV SOLN
5.0000 mL | Freq: Once | INTRAVENOUS | Status: AC | PRN
  Administered 2021-09-14: 5 mL via INTRAVENOUS

## 2021-09-18 ENCOUNTER — Telehealth: Payer: Self-pay | Admitting: Oncology

## 2021-09-18 ENCOUNTER — Telehealth: Payer: Self-pay

## 2021-09-18 NOTE — Telephone Encounter (Signed)
Called to schedule pt per in basket msg, Dr.Yu, Pt stated that she would like to be placed with different provider. Please advise .Marland KitchenKJ

## 2021-09-18 NOTE — Telephone Encounter (Signed)
Please schedule patient for Follow up ASAP. Lab encounter after MD. Please notify patient of appt. Thanks

## 2021-09-18 NOTE — Telephone Encounter (Signed)
-----   Message from Secundino Ginger sent at 09/18/2021  9:32 AM EDT ----- Regarding: Pershing sent to her chart. This is an established patient w/ Dr Tasia Catchings.  Last seen 07/25/2019, at which time she cancelled all her appts her and has been seen by her primary dr. She now has Colon Cancer. Please schedule.

## 2021-09-18 NOTE — Telephone Encounter (Signed)
Received request that the patient wanted to change providers. I called and spoke with the patient and she has requested a transfer to another provider in the organization. Pt has trouble hearing and a hard time understanding the barriers with the language.Pt had no other issues other than that.

## 2021-09-18 NOTE — Telephone Encounter (Signed)
Ok

## 2021-09-19 ENCOUNTER — Telehealth: Payer: Self-pay | Admitting: Internal Medicine

## 2021-09-19 NOTE — Telephone Encounter (Signed)
spoke with pt in regards to provider switch, appts have been scheduled and pt confirmed .Marland KitchenKJ

## 2021-09-22 ENCOUNTER — Encounter: Payer: Self-pay | Admitting: Internal Medicine

## 2021-09-22 ENCOUNTER — Inpatient Hospital Stay: Payer: Medicare HMO | Admitting: Internal Medicine

## 2021-09-22 ENCOUNTER — Other Ambulatory Visit: Payer: Medicare HMO

## 2021-09-22 ENCOUNTER — Inpatient Hospital Stay: Payer: Medicare HMO | Attending: Internal Medicine

## 2021-09-22 ENCOUNTER — Ambulatory Visit: Payer: Medicare HMO | Admitting: Internal Medicine

## 2021-09-22 ENCOUNTER — Inpatient Hospital Stay: Payer: Medicare HMO

## 2021-09-22 ENCOUNTER — Inpatient Hospital Stay (HOSPITAL_BASED_OUTPATIENT_CLINIC_OR_DEPARTMENT_OTHER): Payer: Medicare HMO | Admitting: Hospice and Palliative Medicine

## 2021-09-22 DIAGNOSIS — Z9221 Personal history of antineoplastic chemotherapy: Secondary | ICD-10-CM | POA: Diagnosis not present

## 2021-09-22 DIAGNOSIS — R112 Nausea with vomiting, unspecified: Secondary | ICD-10-CM | POA: Insufficient documentation

## 2021-09-22 DIAGNOSIS — C787 Secondary malignant neoplasm of liver and intrahepatic bile duct: Secondary | ICD-10-CM | POA: Insufficient documentation

## 2021-09-22 DIAGNOSIS — C7800 Secondary malignant neoplasm of unspecified lung: Secondary | ICD-10-CM | POA: Insufficient documentation

## 2021-09-22 DIAGNOSIS — D509 Iron deficiency anemia, unspecified: Secondary | ICD-10-CM | POA: Diagnosis not present

## 2021-09-22 DIAGNOSIS — C182 Malignant neoplasm of ascending colon: Secondary | ICD-10-CM

## 2021-09-22 DIAGNOSIS — F17210 Nicotine dependence, cigarettes, uncomplicated: Secondary | ICD-10-CM | POA: Diagnosis not present

## 2021-09-22 MED ORDER — PREDNISONE 10 MG PO TABS: 10.0000 mg | ORAL_TABLET | Freq: Every day | ORAL | 0 refills | Status: DC

## 2021-09-22 MED ORDER — TRAMADOL HCL 50 MG PO TABS: 50.0000 mg | ORAL_TABLET | Freq: Four times a day (QID) | ORAL | 0 refills | Status: DC | PRN

## 2021-09-22 NOTE — Assessment & Plan Note (Addendum)
#  2020-high risk stage III colon cancer-status post 5 cycles of adjuvant 5-FU; discontinued because of complication of chemotherapy/patient preference.   #Janesa 2023-[abdominal pain]CT scan concerning for liver lesions and lesions highly concerning for metastatic disease given history of colon cancer.   #Patient is not interested in any further work-up/therapies given her poor tolerance to previous chemotherapy.  I reviewed the imaging with the patient and her son in detail.  Discussed the prognosis would be in the order of few months.  Also discussed that with very aggressive chemotherapy unfortunately the life expectancy median 1 to 2 years.  Patient clearly made the decision not to proceed with any further work-up/therapies.   #Given her continued symptoms of abdominal pain-I would recommend continued NSAID; prednisone.  Also recommend evaluation with [facility] palliative care evaluation.  Discussed with [employer].  #Also discussed regarding hospice care at home.  Discussed the hospice philosophy.  Patient had a friend who went through hospice home.  After speaking to Josh-patient agreed for referral.  I have also reached out to patient's primary care physician Dr. Ouida Sills to discuss the patient's plan of care.  Thank you Dr.Anderson for allowing me to participate in the care of your pleasant patient. Please do not hesitate to contact me with questions or concerns in the interim.  # DISPOSITION: # no labs # follow up as needed-Dr.B

## 2021-09-22 NOTE — Progress Notes (Signed)
[place] OFFICE PROGRESS NOTE  Patient Care Team: [name], MD as PCP - General (Internal Medicine) Clent Jacks, RN as Oncology Nurse Navigator Noreene Filbert, MD as Radiation Oncologist (Radiation Oncology) Cammie Sickle, MD as Consulting Physician (Oncology)   Cancer Staging  Cancer of right colon [facility]) Staging form: Colon and Rectum, AJCC 8th Edition - Pathologic stage from 10/31/2018: Stage IIIB (pT4a, pN1c, cM0) - Signed by [name], MD on 10/31/2018 Stage prefix: Initial diagnosis Total positive nodes: 0 Histologic grading system: 4 grade system Histologic grade (G): G2    Oncology History Overview Note   Patient had acute cholecystitis and had laparoscopic cholecystectomy same day by Dr. Peyton Najjar. Patient was found to have iron deficiency anemia with hemoglobin around 9.2, ferritin 7 and iron 24. Baseline hemoglobin 1 year ago was 13.1.  Patient was started on iron supplementation with Ferrex 150 mg iron capsule daily. Reports to have chronic constipation.  Denies any blood in the stool. Patient was seen by gastroenterology and had endoscopy and colonoscopy done on 10/06/2018. There was a partially obstructing mass in the cecum which was biopsied. 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 Gastric mucosa shows reactive foveolar hyperplasia, stromal fibrosis and focal chronic inflammation.  Negative for H. pylori.  Duodenal biopsy shows no abnormalities.  No evidence of celiac disease.  Sigmoid polyps showed tubular adenoma. 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     # 10/23/2018, she underwent right hemicolectomy [Dr.Cintron]-  Pathology showed invasive moderately differentiated adenocarcinoma involving cecum and adjacent terminal ileum with extension into pericecal adipose tissue and focally to inked serosal surface. Invasive moderately differentiated adenocarcinoma involving  proximal ascending colon and ileocecal valve with extension into pericolonic adipose tissue.  Pathological staging pT4a pN1c; MMR-INTACT.   #  [place] 8th, 2023-  Numerous new ill-defined, hypoenhancing liver masses, consistent with hepatic metastatic disease. 2. Multiple new bilateral pulmonary nodules in the included lung bases, consistent with new pulmonary metastatic disease. 3. Status post right hemicolectomy and ileocolic anastomosis.  #       Cancer of right colon ([place])  10/23/2018 Initial Diagnosis   Cancer of right colon ([place])   10/31/2018 Cancer Staging   Staging form: Colon and Rectum, AJCC 8th Edition - Pathologic stage from 10/31/2018: Stage IIIB (pT4a, pN1c, cM0) - Signed by [name], MD on 10/31/2018   12/17/2018 -  Chemotherapy   The patient had leucovorin 692 mg in dextrose 5 % 250 mL infusion, 400 mg/m2 = 692 mg, Intravenous,  Once, 3 of 10 cycles Dose modification: 400 mg/m2 (original dose 400 mg/m2, Cycle 5, Reason: Other (see comments), Comment: over 13KGM per policy) fluorouracil (ADRUCIL) chemo injection 700 mg, 400 mg/m2 = 700 mg, Intravenous,  Once, 5 of 12 cycles Administration: 700 mg (12/17/2018), 700 mg (12/31/2018), 700 mg (01/28/2019), 700 mg (02/11/2019), 700 mg (02/25/2019) leucovorin injection 34 mg, 20 mg/m2 = 34 mg, Intravenous,  Once, 2 of 2 cycles Administration: 34 mg (01/28/2019) fluorouracil (ADRUCIL) 4,150 mg in sodium chloride 0.9 % 67 mL chemo infusion, 2,400 mg/m2 = 4,150 mg, Intravenous, 1 Day/Dose, 5 of 12 cycles Administration: 4,150 mg (12/17/2018), 4,150 mg (12/31/2018), 4,150 mg (01/28/2019), 4,150 mg (02/11/2019), 4,150 mg (02/25/2019)  for chemotherapy treatment.        INTERVAL HISTORY: Ambulating independently.  Accompanied by her son.  Mary Beltran 81 y.o.  female pleasant patient above history of  has been referred to [place] for further evaluation recommendations history of colon  cancer.  Patient diagnosed with colon cancer in 2020 stage III  high risk.  Patient previously evaluated by Dr. Tasia Catchings.  Patient status post adjuvant 5-FU x5 cycles.  Discontinued because of poor tolerance/admission to hospital etc.  Patient complains of worsening right upper quadrant pain which led to further  - imaging in [place] of this year patient noted to have multiple liver lesions and lung lesions-concerning for metastatic disease.  Patient is here to discuss her options.   Overall feels poorly.  Patient has been taking ibuprofen for her abdominal pain.  Positive for nausea and vomiting.   Review of Systems  Constitutional:  Positive for malaise/fatigue and weight loss. Negative for chills, diaphoresis and fever.  HENT:  Negative for nosebleeds and sore throat.   Eyes:  Negative for double vision.  Respiratory:  Negative for cough, hemoptysis, sputum production, shortness of breath and wheezing.   Cardiovascular:  Negative for chest pain, palpitations, orthopnea and leg swelling.  Gastrointestinal:  Positive for abdominal pain and nausea. Negative for blood in stool, constipation, diarrhea, heartburn, melena and vomiting.  Genitourinary:  Negative for dysuria, frequency and urgency.  Musculoskeletal:  Positive for joint pain. Negative for back pain.  Skin: Negative.  Negative for itching and rash.  Neurological:  Negative for dizziness, tingling, focal weakness, weakness and headaches.  Endo/Heme/Allergies:  Does not bruise/bleed easily.  Psychiatric/Behavioral:  Negative for depression. The patient is not nervous/anxious and does not have insomnia.       PAST MEDICAL HISTORY :  Past Medical History:  Diagnosis Date   Cancer ([place])    Complication of anesthesia    NOT WITH GB   GERD (gastroesophageal reflux disease)    IDA (iron deficiency anemia) 10/13/2018   PONV (postoperative nausea and vomiting)     PAST SURGICAL HISTORY :   Past Surgical History:  Procedure Laterality Date   ABDOMINAL HYSTERECTOMY     complete   APPENDECTOMY      CHOLECYSTECTOMY N/A 08/22/2018   Procedure: LAPAROSCOPIC CHOLECYSTECTOMY;  Surgeon: Herbert Pun, MD;  Location: [facility] ORS;  Service: General;  Laterality: N/A;   COLONOSCOPY     EXCISION MASS LOWER EXTREMETIES Right 06/05/2021   Procedure: EXCISION MASS LOWER EXTREMITY;  Surgeon: Herbert Pun, MD;  Location: [facility] ORS;  Service: General;  Laterality: Right;   LAPAROSCOPIC RIGHT COLECTOMY N/A 10/23/2018   Procedure: LAPAROSCOPIC HAND ASSISTED RIGHT COLECTOMY;  Surgeon: Herbert Pun, MD;  Location: [facility] ORS;  Service: General;  Laterality: N/A;   PERCUTANEOUS PINNING WRIST FRACTURE Right    PORTACATH PLACEMENT Right 12/08/2018   Procedure: INSERTION PORT-A-CATH, CENTRAL VENOUS CATH WITH SUBCUTANEOUS INFUSION PORT;  Surgeon: Herbert Pun, MD;  Location: [facility] ORS;  Service: General;  Laterality: Right;   WRIST FOREIGN BODY REMOVAL Right    2016    FAMILY HISTORY :   Family History  Problem Relation Age of Onset   COPD Mother    Suicidality Father    COPD Sister    Failure to thrive Sister    Breast cancer Paternal Grandmother     SOCIAL HISTORY:   Social History   Tobacco Use   Smoking status: Every Day    Packs/day: 0.25    Years: 60.00    Total pack years: 15.00    Types: Cigarettes   Smokeless tobacco: Never   Tobacco comments:    Patient states that this is the only thing she enjoys  Vaping Use   Vaping Use: Never used  Substance Use Topics  Alcohol use: No   Drug use: Not Currently    ALLERGIES:  is allergic to pain & fever [acetaminophen], sulfa antibiotics, ciprofloxacin, codeine, and penicillins.  MEDICATIONS:  Current Outpatient Medications  Medication Sig Dispense Refill   ibuprofen (ADVIL) 200 MG tablet Take 200 mg by mouth every 6 (six) hours as needed.     predniSONE (DELTASONE) 10 MG tablet Take 1 tablet (10 mg total) by mouth daily with breakfast. 15 tablet 0   traMADol (ULTRAM) 50 MG tablet Take 1 tablet (50 mg total) by  mouth every 6 (six) hours as needed. 30 tablet 0   No current facility-administered medications for this visit.    PHYSICAL EXAMINATION: ECOG PERFORMANCE STATUS: 1 - Symptomatic but completely ambulatory  BP (!) 175/78 (BP Location: Left Arm, Patient Position: Sitting, Cuff Size: Normal)   Pulse 97   Temp 98.2 F (36.8 C) (Tympanic)   Ht 5' 6"  (1.676 m)   Wt 123 lb 3.2 oz (55.9 kg)   SpO2 99%   BMI 19.89 kg/m   Filed Weights   09/22/21 0924  Weight: 123 lb 3.2 oz (55.9 kg)   Positive for hepatomegaly.  Physical Exam Vitals and nursing note reviewed.  HENT:     Head: Normocephalic and atraumatic.     Mouth/Throat:     Pharynx: Oropharynx is clear.  Eyes:     Extraocular Movements: Extraocular movements intact.     Pupils: Pupils are equal, round, and reactive to light.  Cardiovascular:     Rate and Rhythm: Normal rate and regular rhythm.  Pulmonary:     Comments: Decreased breath sounds bilaterally.  Abdominal:     Palpations: Abdomen is soft.  Musculoskeletal:        General: Normal range of motion.     Cervical back: Normal range of motion.  Skin:    General: Skin is warm.  Neurological:     General: No focal deficit present.     Mental Status: She is alert and oriented to person, place, and time.  Psychiatric:        Behavior: Behavior normal.        Judgment: Judgment normal.       LABORATORY DATA:  I have reviewed the data as listed    Component Value Date/Time   NA 134 (L) 06/05/2021 0632   K 4.1 06/05/2021 0632   CL 102 06/05/2021 0632   CO2 18 (L) 06/05/2021 0632   GLUCOSE 108 (H) 06/05/2021 0632   BUN 12 06/05/2021 0632   CREATININE 0.68 06/05/2021 0632   CALCIUM 9.2 06/05/2021 0632   PROT 7.1 07/15/2019 1036   ALBUMIN 4.2 07/15/2019 1036   AST 21 07/15/2019 1036   ALT 17 07/15/2019 1036   ALKPHOS 73 07/15/2019 1036   BILITOT 0.9 07/15/2019 1036   GFRNONAA >60 06/05/2021 0632   GFRAA >60 07/15/2019 1036    No results found for:  "SPEP", "UPEP"  Lab Results  Component Value Date   WBC 10.3 06/05/2021   NEUTROABS 3.4 07/15/2019   HGB 12.8 06/05/2021   HCT 39.7 06/05/2021   MCV 86.7 06/05/2021   PLT 346 06/05/2021      Chemistry      Component Value Date/Time   NA 134 (L) 06/05/2021 0632   K 4.1 06/05/2021 0632   CL 102 06/05/2021 0632   CO2 18 (L) 06/05/2021 0632   BUN 12 06/05/2021 0632   CREATININE 0.68 06/05/2021 0109      Component Value Date/Time  CALCIUM 9.2 06/05/2021 0632   ALKPHOS 73 07/15/2019 1036   AST 21 07/15/2019 1036   ALT 17 07/15/2019 1036   BILITOT 0.9 07/15/2019 1036       RADIOGRAPHIC STUDIES: I have personally reviewed the radiological images as listed and agreed with the findings in the report. No results found.   ASSESSMENT & PLAN:  Cancer of right colon ([place]) #2020-high risk stage III colon cancer-status post 5 cycles of adjuvant 5-FU; discontinued because of complication of chemotherapy/patient preference.   #[place] 2023-[abdominal pain]CT scan concerning for liver lesions and lesions highly concerning for metastatic disease given history of colon cancer.   #Patient is not interested in any further work-up/therapies given her poor tolerance to previous chemotherapy.  I reviewed the imaging with the patient and her son in detail.  Discussed the prognosis would be in the order of few months.  Also discussed that with very aggressive chemotherapy unfortunately the life expectancy median 1 to 2 years.  Patient clearly made the decision not to proceed with any further work-up/therapies.   #Given her continued symptoms of abdominal pain-I would recommend continued NSAID; prednisone.  Also recommend evaluation with [facility] care evaluation.  Discussed with [employer].  #Also discussed regarding hospice care at home.  Discussed the hospice philosophy.  Patient had a friend who went through hospice home.  After speaking to [facility]-patient agreed for referral.  I have  also reached out to patient's primary care physician Dr. Ouida Sills to discuss the patient's plan of care.  Thank you Dr.Anderson for allowing me to participate in the care of your pleasant patient. Please do not hesitate to contact me with questions or concerns in the interim.  # DISPOSITION: # no labs # follow up as needed-Dr.B   No orders of the defined types were placed in this encounter.  All questions were answered. The patient knows to call the clinic with any problems, questions or concerns.      Cammie Sickle, MD 09/22/2021 1:03 PM

## 2021-09-22 NOTE — Progress Notes (Signed)
Pt would like to know how rapid her condition could progress.

## 2021-09-22 NOTE — Progress Notes (Signed)
[place] at [facility] [phone] [phone]   Name: Mary Beltran Date: 09/22/2021 MRN: [id number]  DOB: 1940/06/14  Patient Care Team: [name], MD as PCP - General (Internal Medicine) Clent Jacks, RN as Oncology Nurse Navigator Noreene Filbert, MD as Radiation Oncologist (Radiation Oncology) Cammie Sickle, MD as Consulting Physician (Oncology)    REASON FOR CONSULTATION: Mary Beltran is a 81 y.o. female with multiple medical problems including history of colon cancer status post right hemicolectomy in July 2020, which was initially staged as 3B.  Patient had adjuvant 5-FU chemotherapy but poorly tolerated this.  Follow-up imaging revealed multiple liver lesions and lung lesions concerning for metastatic disease.  Patient was referred to palliative care to help address goals.  SOCIAL HISTORY:     reports that she has been smoking cigarettes. She has a 15.00 pack-year smoking history. She has never used smokeless tobacco. She reports that she does not currently use drugs. She reports that she does not drink alcohol.  Patient lives at home.  She has 2 sons who are involved in her care.  ADVANCE DIRECTIVES:  On file  CODE STATUS: DNR/DNI (DNR order signed on 09/22/2021)  PAST MEDICAL HISTORY: Past Medical History:  Diagnosis Date   Cancer ([place])    Complication of anesthesia    NOT WITH GB   GERD (gastroesophageal reflux disease)    IDA (iron deficiency anemia) 10/13/2018   PONV (postoperative nausea and vomiting)     PAST SURGICAL HISTORY:  Past Surgical History:  Procedure Laterality Date   ABDOMINAL HYSTERECTOMY     complete   APPENDECTOMY     CHOLECYSTECTOMY N/A 08/22/2018   Procedure: LAPAROSCOPIC CHOLECYSTECTOMY;  Surgeon: Herbert Pun, MD;  Location: [facility] ORS;  Service: General;  Laterality: N/A;   COLONOSCOPY     EXCISION MASS LOWER EXTREMETIES Right 06/05/2021    Procedure: EXCISION MASS LOWER EXTREMITY;  Surgeon: Herbert Pun, MD;  Location: [facility] ORS;  Service: General;  Laterality: Right;   LAPAROSCOPIC RIGHT COLECTOMY N/A 10/23/2018   Procedure: LAPAROSCOPIC HAND ASSISTED RIGHT COLECTOMY;  Surgeon: Herbert Pun, MD;  Location: [facility] ORS;  Service: General;  Laterality: N/A;   PERCUTANEOUS PINNING WRIST FRACTURE Right    PORTACATH PLACEMENT Right 12/08/2018   Procedure: INSERTION PORT-A-CATH, CENTRAL VENOUS CATH WITH SUBCUTANEOUS INFUSION PORT;  Surgeon: Herbert Pun, MD;  Location: [facility] ORS;  Service: General;  Laterality: Right;   WRIST FOREIGN BODY REMOVAL Right    2016    HEMATOLOGY/ONCOLOGY HISTORY:  Oncology History Overview Note   Patient had acute cholecystitis and had laparoscopic cholecystectomy same day by Dr. Peyton Najjar. Patient was found to have iron deficiency anemia with hemoglobin around 9.2, ferritin 7 and iron 24. Baseline hemoglobin 1 year ago was 13.1.  Patient was started on iron supplementation with Ferrex 150 mg iron capsule daily. Reports to have chronic constipation.  Denies any blood in the stool. Patient was seen by gastroenterology and had endoscopy and colonoscopy done on 10/06/2018. There was a partially obstructing mass in the cecum which was biopsied. 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 Gastric mucosa shows reactive foveolar hyperplasia, stromal fibrosis and focal chronic inflammation.  Negative for H. pylori.  Duodenal biopsy shows no abnormalities.  No evidence of celiac disease.  Sigmoid polyps showed tubular adenoma. 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 10 pound weight loss within past few months. Denies any family history colon cancer.  Paternal grandmother passed away due  to breast cancer. Per patient's request, I called patient's son [name] and sister [name], and put both of them on speaker phone so they can hear the  conversation and participate in discussion.   # 10/23/2018, she underwent right hemicolectomy [Dr.Cintron]-  Pathology showed invasive moderately differentiated adenocarcinoma involving cecum and adjacent terminal ileum with extension into pericecal adipose tissue and focally to inked serosal surface. Invasive moderately differentiated adenocarcinoma involving proximal ascending colon and ileocecal valve with extension into pericolonic adipose tissue.  Pathological staging pT4a pN1c; MMR-INTACT.   #  [place] 8th, 2023-  Numerous new ill-defined, hypoenhancing liver masses, consistent with hepatic metastatic disease. 2. Multiple new bilateral pulmonary nodules in the included lung bases, consistent with new pulmonary metastatic disease. 3. Status post right hemicolectomy and ileocolic anastomosis.  #    #Iron deficiency anemia, status post IV Venofer treatments.     Cancer of right colon ([place])  10/23/2018 Initial Diagnosis   Cancer of right colon ([place])   10/31/2018 Cancer Staging   Staging form: Colon and Rectum, AJCC 8th Edition - Pathologic stage from 10/31/2018: Stage IIIB (pT4a, pN1c, cM0) - Signed by [name], MD on 10/31/2018   12/17/2018 -  Chemotherapy   The patient had leucovorin 692 mg in dextrose 5 % 250 mL infusion, 400 mg/m2 = 692 mg, Intravenous,  Once, 3 of 10 cycles Dose modification: 400 mg/m2 (original dose 400 mg/m2, Cycle 5, Reason: Other (see comments), Comment: over [id number] per policy) fluorouracil (ADRUCIL) chemo injection 700 mg, 400 mg/m2 = 700 mg, Intravenous,  Once, 5 of 12 cycles Administration: 700 mg (12/17/2018), 700 mg (12/31/2018), 700 mg (01/28/2019), 700 mg (02/11/2019), 700 mg (02/25/2019) leucovorin injection 34 mg, 20 mg/m2 = 34 mg, Intravenous,  Once, 2 of 2 cycles Administration: 34 mg (01/28/2019) fluorouracil (ADRUCIL) 4,150 mg in sodium chloride 0.9 % 67 mL chemo infusion, 2,400 mg/m2 = 4,150 mg, Intravenous, 1 Day/Dose, 5 of 12 cycles Administration: 4,150  mg (12/17/2018), 4,150 mg (12/31/2018), 4,150 mg (01/28/2019), 4,150 mg (02/11/2019), 4,150 mg (02/25/2019)  for chemotherapy treatment.      ALLERGIES:  is allergic to pain & fever [acetaminophen], sulfa antibiotics, ciprofloxacin, codeine, and penicillins.  MEDICATIONS:  Current Outpatient Medications  Medication Sig Dispense Refill   predniSONE (DELTASONE) 10 MG tablet Take 1 tablet (10 mg total) by mouth daily with breakfast. 15 tablet 0   ibuprofen (ADVIL) 200 MG tablet Take 200 mg by mouth every 6 (six) hours as needed.     No current facility-administered medications for this visit.    VITAL SIGNS: There were no vitals taken for this visit. There were no vitals filed for this visit.  Estimated body mass index is 19.89 kg/m as calculated from the following:   Height as of an earlier encounter on 09/22/21: 5' 6" (1.676 m).   Weight as of an earlier encounter on 09/22/21: 123 lb 3.2 oz (55.9 kg).  LABS: CBC:    Component Value Date/Time   WBC 10.3 06/05/2021 0632   HGB 12.8 06/05/2021 0632   HCT 39.7 06/05/2021 0632   PLT 346 06/05/2021 0632   MCV 86.7 06/05/2021 0632   NEUTROABS 3.4 07/15/2019 1036   LYMPHSABS 0.8 07/15/2019 1036   MONOABS 0.5 07/15/2019 1036   EOSABS 0.1 07/15/2019 1036   BASOSABS 0.1 07/15/2019 1036   Comprehensive Metabolic Panel:    Component Value Date/Time   NA 134 (L) 06/05/2021 0632   K 4.1 06/05/2021 0632   CL 102 06/05/2021 0632   CO2 18 (  L) 06/05/2021 0632   BUN 12 06/05/2021 0632   CREATININE 0.68 06/05/2021 0632   GLUCOSE 108 (H) 06/05/2021 0632   CALCIUM 9.2 06/05/2021 0632   AST 21 07/15/2019 1036   ALT 17 07/15/2019 1036   ALKPHOS 73 07/15/2019 1036   BILITOT 0.9 07/15/2019 1036   PROT 7.1 07/15/2019 1036   ALBUMIN 4.2 07/15/2019 1036    RADIOGRAPHIC STUDIES: MR BRAIN W WO CONTRAST  Result Date: 09/14/2021 CLINICAL DATA:  Recurring dizziness, near syncope, history of colon cancer EXAM: MRI HEAD WITHOUT AND WITH CONTRAST  TECHNIQUE: Multiplanar, multiecho pulse sequences of the brain and surrounding structures were obtained without and with intravenous contrast. CONTRAST:  74m GADAVIST GADOBUTROL 1 MMOL/ML IV SOLN COMPARISON:  None Available. FINDINGS: Brain: No restricted diffusion to suggest acute or subacute infarct. No acute hemorrhage, mass, mass effect, or midline shift. No hemosiderin deposition to suggest remote hemorrhage. No hydrocephalus or extra-axial collection. No abnormal parenchymal or meningeal enhancement. Confluent T2 hyperintense signal in the periventricular white matter, likely the sequela of moderate to severe chronic small vessel ischemic disease. Vascular: Normal flow voids. Normal arterial enhancement. Venous sinuses are patent. Skull and upper cervical spine: Normal marrow signal. Sinuses/Orbits: No acute finding. Other: The mastoids are well aerated. IMPRESSION: No acute intracranial process. No evidence of acute or subacute infarct. No evidence of metastatic disease. Electronically Signed   By: AMerilyn BabaM.D.   On: 09/14/2021 12:18   CT ABDOMEN PELVIS W WO CONTRAST  Result Date: 09/14/2021 CLINICAL DATA:  Dizziness, headaches, right upper quadrant pain, history of colon cancer, status post right hemicolectomy, additional history of melanoma * Tracking Code: BO * EXAM: CT ABDOMEN AND PELVIS WITHOUT AND WITH CONTRAST TECHNIQUE: Multidetector CT imaging of the abdomen and pelvis was performed following the standard protocol before and following the bolus administration of intravenous contrast. RADIATION DOSE REDUCTION: This exam was performed according to the departmental dose-optimization program which includes automated exposure control, adjustment of the mA and/or kV according to patient size and/or use of iterative reconstruction technique. CONTRAST:  1072mOMNIPAQUE IOHEXOL 300 MG/ML  SOLN COMPARISON:  CT abdomen pelvis, 03/10/2019 FINDINGS: Lower chest: No acute abnormality. Multiple new  bilateral pulmonary nodules in the included lung bases, measuring up to 1.5 x 1.4 cm in the left lower lobe (series 6, image 1). Coronary artery calcifications. Hepatobiliary: Numerous ill-defined, hypoenhancing liver masses, largest in the inferior right lobe of the liver, hepatic segment V/VI measuring approximately 6.6 x 4.5 cm (series 4, image 26). Status post cholecystectomy. Mild postoperative biliary ductal dilatation. Pancreas: Unremarkable. No pancreatic ductal dilatation or surrounding inflammatory changes. Spleen: Normal in size without significant abnormality. Adrenals/Urinary Tract: Adrenal glands are unremarkable. Kidneys are normal, without renal calculi, solid lesion, or hydronephrosis. Bladder is unremarkable. Stomach/Bowel: Stomach is within normal limits. Status post right hemicolectomy and ileocolic anastomosis. No evidence of bowel wall thickening, distention, or inflammatory changes. Occasional sigmoid diverticula. Vascular/Lymphatic: Aortic atherosclerosis. No enlarged abdominal or pelvic lymph nodes. Reproductive: Status post hysterectomy. Other: No abdominal wall hernia or abnormality. No ascites. Musculoskeletal: No acute or significant osseous findings. IMPRESSION: 1. Numerous new ill-defined, hypoenhancing liver masses, consistent with hepatic metastatic disease. 2. Multiple new bilateral pulmonary nodules in the included lung bases, consistent with new pulmonary metastatic disease. 3. Status post right hemicolectomy and ileocolic anastomosis. 4. Coronary artery disease. These results will be called to the ordering clinician or representative by the Radiologist Assistant, and communication documented in the PACS or ClFrontier Oil CorporationAortic Atherosclerosis (ICD10-I70.0).  Electronically Signed   By: Delanna Ahmadi M.D.   On: 09/14/2021 11:29    PERFORMANCE STATUS (ECOG) : 1 - Symptomatic but completely ambulatory  Review of Systems Unless otherwise noted, a complete review of systems is  negative.  Physical Exam General: NAD Pulmonary: Unlabored Extremities: no edema, no joint deformities Skin: no rashes Neurological: Weakness but otherwise nonfocal  IMPRESSION: I met with patient today at Dr. Aletha Halim request.  Unfortunately, CT of the abdomen and pelvis on 09/14/2021 revealed innumerable liver masses and multiple new bilateral pulmonary nodules consistent with stage IV cancer.  Patient had poor tolerance of previous chemotherapy and has decided not to pursue further cancer treatments.  Instead, she wants to focus on comfort and quality of life at home.  Discussed option of hospice in detail with patient and son.  Patient is interested in pursuing hospice.  Referral sent to [facility].   Patient is interested in staying at home for as long as possible but would like her end-of-life to be at the hospice facility if possible.  Symptomatically, she appears to be having capsular pain.  She is reluctant to take opioid pain medications but did agree for me to prescribe tramadol to have on hand if needed.  We will start her on prednisone for capsular pain.  Patient states that she would not want to be resuscitated at end-of-life.  I signed a DNR order for her to take home.  PLAN: -Best supportive care -DNR/DNI -Referral to hospice -Start prednisone 10 mg daily -Tramadol 50 mg every 6 hours as needed #30 -RTC as needed   Patient expressed understanding and was in agreement with this plan. She also understands that She can call the clinic at any time with any questions, concerns, or complaints.    Time Total: 20 minutes  Visit consisted of counseling and education dealing with the complex and emotionally intense issues of symptom management and palliative care in the setting of serious and potentially life-threatening illness.Greater than 50%  of this time was spent counseling and coordinating care related to the above assessment and plan.  Signed by: Altha Harm,  PhD, NP-C

## 2021-10-02 ENCOUNTER — Telehealth: Payer: Self-pay | Admitting: *Deleted

## 2021-10-02 MED ORDER — ONDANSETRON HCL 8 MG PO TABS: 8.0000 mg | ORAL_TABLET | Freq: Three times a day (TID) | ORAL | 2 refills | Status: AC | PRN

## 2021-10-11 ENCOUNTER — Other Ambulatory Visit: Payer: Self-pay

## 2021-10-11 MED ORDER — PREDNISONE 10 MG PO TABS: 10.0000 mg | ORAL_TABLET | Freq: Every day | ORAL | 0 refills | Status: DC

## 2021-10-11 NOTE — Telephone Encounter (Signed)
Patient states that she would like a refill on prednisone - it made her feel better and have more energy. You can call Katharine Look with [facility] hospice if you have any questions at [phone]

## 2021-10-24 ENCOUNTER — Other Ambulatory Visit: Payer: Self-pay | Admitting: Internal Medicine

## 2021-10-25 ENCOUNTER — Telehealth: Payer: Self-pay | Admitting: *Deleted

## 2021-10-25 MED ORDER — DEXAMETHASONE 2 MG PO TABS: 2.0000 mg | ORAL_TABLET | Freq: Every day | ORAL | 0 refills | Status: DC

## 2021-10-25 MED ORDER — OLANZAPINE 10 MG PO TABS: 5.0000 mg | ORAL_TABLET | Freq: Every evening | ORAL | 0 refills | Status: DC | PRN

## 2021-10-25 NOTE — Telephone Encounter (Signed)
Spoke with hospice nurse.  Patient has stopped taking the ondansetron as she did not think it helped.  She is taking the Compazine but this is causing daytime drowsiness.  We will rotate from Compazine to olanzapine at bedtime.  Recommend restarting ondansetron during the daytime hours if needed.  We will rotate from prednisone to dexamethasone.

## 2021-10-25 NOTE — Telephone Encounter (Signed)
Katharine Look with hospice called reporting that patient is having uncontrolled nausea, She is using Compazine. Patient usually gets up and eats then takes her prednisone and becomes nauseated. She suggested that she take her nausea medicine first them eat and take steroids. She is also reporting nausea after taking the Tramadol. She is asking what else can be done for patient. Please return her call [phone]

## 2021-10-30 ENCOUNTER — Telehealth: Payer: Self-pay | Admitting: *Deleted

## 2021-10-30 NOTE — Telephone Encounter (Signed)
Call from [place] with hospice reporting that patient is having AMS since starting the Olanzapine. This was note by her daughter who is a Marine scientist and stated with her the weekend. She states that patient urine was checked and is ok. They are requesting that the Olanzapine be changed to something else. Please advise

## 2021-10-30 NOTE — Telephone Encounter (Signed)
I spoke with hospice nurse.  Nausea is improved after starting olanzapine but patient is having visual and auditory hallucinations.  Okay to discontinue olanzapine.  Can try as needed Haldol 0.37m Q6H PRN.

## 2021-11-01 ENCOUNTER — Other Ambulatory Visit: Payer: Self-pay | Admitting: Hospice and Palliative Medicine

## 2021-11-01 MED ORDER — TRAMADOL HCL 50 MG PO TABS: 50.0000 mg | ORAL_TABLET | Freq: Four times a day (QID) | ORAL | 0 refills | Status: DC | PRN

## 2021-11-01 MED ORDER — NITROFURANTOIN MONOHYD MACRO 100 MG PO CAPS: 100.0000 mg | ORAL_CAPSULE | Freq: Two times a day (BID) | ORAL | 0 refills | Status: AC

## 2021-11-01 NOTE — Progress Notes (Signed)
Spoke with hospice nurse, Katharine Look.  Reportedly, UA appears consistent with UTI.  This would explain patient's recent visual hallucinations.  Of note, patient appears to have documented allergies to multiple different antibiotic classes including penicillins, sulfas, and fluoroquinolones.  We will start her on Macrobid 100 mg twice daily x5 days.  Hospice to monitor closely.  Hospice nurse also requested refill of tramadol.  Rx sent to pharmacy

## 2021-11-13 ENCOUNTER — Telehealth: Payer: Self-pay | Admitting: *Deleted

## 2021-11-13 NOTE — Telephone Encounter (Signed)
I returned the hospice nurse's call.- edema is mild.  Spoke with Ander Purpura- NP. Will continue to monitor for now. Hospice will re-evaluate pt on Wed and give our office a call back if symptoms are worse.

## 2021-11-13 NOTE — Telephone Encounter (Signed)
Mary Beltran from [facility] [phone]  requesting a return phone call. Edema in abdominal area and feet and are bothering. Edema started this weekend.  Pain and nausea is under control.  Please advise if anything needs to be done for the edema.

## 2021-11-21 ENCOUNTER — Other Ambulatory Visit: Payer: Self-pay | Admitting: *Deleted

## 2021-11-21 MED ORDER — OLANZAPINE 10 MG PO TABS: 5.0000 mg | ORAL_TABLET | Freq: Every evening | ORAL | 1 refills | Status: DC | PRN

## 2021-11-22 ENCOUNTER — Other Ambulatory Visit: Payer: Self-pay | Admitting: *Deleted

## 2021-11-22 MED ORDER — TRAMADOL HCL 50 MG PO TABS: 50.0000 mg | ORAL_TABLET | Freq: Four times a day (QID) | ORAL | 0 refills | Status: DC | PRN

## 2021-11-22 MED ORDER — DEXAMETHASONE 2 MG PO TABS: 2.0000 mg | ORAL_TABLET | Freq: Every day | ORAL | 0 refills | Status: AC

## 2021-11-30 ENCOUNTER — Other Ambulatory Visit: Payer: Self-pay | Admitting: Hospice and Palliative Medicine

## 2021-11-30 MED ORDER — OXYCODONE HCL 5 MG PO TABS: 5.0000 mg | ORAL_TABLET | ORAL | 0 refills | Status: AC | PRN

## 2021-11-30 NOTE — Progress Notes (Signed)
Hospice nurse, Lujean Rave, called and stated that tramadol is not effective at max dosing. Will rotate to oxycodone. Oxycodone 49m Q4H PRN, #45

## 2021-12-01 ENCOUNTER — Telehealth: Payer: Self-pay | Admitting: *Deleted

## 2021-12-01 NOTE — Telephone Encounter (Signed)
Call from [place] stating that [facility] doe snot have Oxy in stock and was only able to partial fill last prescription sent to them. Patient is requesting to go back on Tramadol at a higher dose. Please advise, and she will need refill of Tramadol

## 2021-12-08 ENCOUNTER — Other Ambulatory Visit: Payer: Self-pay | Admitting: *Deleted

## 2021-12-08 MED ORDER — TRAMADOL HCL 50 MG PO TABS
ORAL_TABLET | ORAL | 1 refills | Status: AC
Start: 2021-12-08 — End: ?

## 2021-12-12 ENCOUNTER — Other Ambulatory Visit: Payer: Self-pay | Admitting: *Deleted

## 2021-12-12 MED ORDER — LORAZEPAM 1 MG PO TABS: 1.0000 mg | ORAL_TABLET | ORAL | 3 refills | Status: AC | PRN

## 2021-12-12 NOTE — Telephone Encounter (Signed)
Call from [place] with hospice asking if patient can increase her Ativan to 1 mg every 4 hours for anxiety pere patient request and also states her constipation is worse with increase in Tramadol and wants to increase Senna to 2 tabs twice a day. Please advise,She will need new prescription for Ativan

## 2021-12-12 NOTE — Telephone Encounter (Signed)
SAndra informed that Ativen new dose was sent to pharmacy and that it is ok to increase senna dose

## 2021-12-15 ENCOUNTER — Telehealth: Payer: Self-pay | Admitting: *Deleted

## 2021-12-15 NOTE — Telephone Encounter (Signed)
I spoke with hospice nurse, Katharine Look.  Patient having bilateral lower extremity edema.  No redness, rashes, weeping, or lesions.  She is ambulatory without significant weight gain.  No significant changes in oral intake.  No shortness of breath.  I recommended conservative measures including elevating feet and compression stockings.  Hospice okay to utilize low-dose oral furosemide 20 mg daily x2 to 3 days if no improvement.

## 2021-12-15 NOTE — Telephone Encounter (Signed)
Call from [place] stating that patient called their triage line complaining of swelling in her feet. She states she poke with ot and when she pushes on skin it does leave an indention. [place] that it must be bad if patient called and is asking for something for it. The patient is trying to keep her legs elevated.

## 2021-12-16 ENCOUNTER — Other Ambulatory Visit: Payer: Self-pay | Admitting: Internal Medicine

## 2021-12-17 ENCOUNTER — Other Ambulatory Visit: Payer: Self-pay | Admitting: Internal Medicine

## 2021-12-18 ENCOUNTER — Other Ambulatory Visit: Payer: Self-pay | Admitting: Internal Medicine

## 2021-12-18 ENCOUNTER — Encounter: Payer: Self-pay | Admitting: Oncology

## 2021-12-29 ENCOUNTER — Telehealth: Payer: Self-pay | Admitting: *Deleted

## 2021-12-29 NOTE — Telephone Encounter (Signed)
Call from [place] with hospice asking for ok to call in Nystatin Susp as patient has thrush irritated throat, hoarseness No fever. Please return her call

## 2022-01-01 ENCOUNTER — Encounter: Payer: Self-pay | Admitting: Oncology

## 2022-01-15 ENCOUNTER — Other Ambulatory Visit: Payer: Self-pay | Admitting: *Deleted

## 2022-01-15 MED ORDER — OLANZAPINE 10 MG PO TABS: 5.0000 mg | ORAL_TABLET | Freq: Every evening | ORAL | 1 refills | Status: AC | PRN

## 2022-01-18 ENCOUNTER — Telehealth: Payer: Self-pay | Admitting: *Deleted

## 2022-01-18 NOTE — Telephone Encounter (Signed)
Phone note entered in error

## 2022-02-07 NOTE — Telephone Encounter (Signed)
[facility] request rf- for zyprexa

## 2022-02-07 DEATH — deceased
